# Patient Record
Sex: Female | Born: 1985 | Race: Black or African American | Hispanic: No | Marital: Single | Smoking: Never smoker
Health system: Southern US, Community
[De-identification: ages and names within clinical notes are randomized; demographics above are authoritative.]

## PROBLEM LIST (undated history)

## (undated) DIAGNOSIS — I272 Pulmonary hypertension, unspecified: Secondary | ICD-10-CM

## (undated) DIAGNOSIS — I10 Essential (primary) hypertension: Secondary | ICD-10-CM

## (undated) DIAGNOSIS — D219 Benign neoplasm of connective and other soft tissue, unspecified: Secondary | ICD-10-CM

## (undated) DIAGNOSIS — F329 Major depressive disorder, single episode, unspecified: Secondary | ICD-10-CM

## (undated) DIAGNOSIS — I82409 Acute embolism and thrombosis of unspecified deep veins of unspecified lower extremity: Secondary | ICD-10-CM

## (undated) DIAGNOSIS — G4730 Sleep apnea, unspecified: Secondary | ICD-10-CM

## (undated) DIAGNOSIS — E119 Type 2 diabetes mellitus without complications: Secondary | ICD-10-CM

## (undated) DIAGNOSIS — D6861 Antiphospholipid syndrome: Secondary | ICD-10-CM

## (undated) DIAGNOSIS — E669 Obesity, unspecified: Secondary | ICD-10-CM

## (undated) DIAGNOSIS — I2699 Other pulmonary embolism without acute cor pulmonale: Secondary | ICD-10-CM

## (undated) DIAGNOSIS — N939 Abnormal uterine and vaginal bleeding, unspecified: Secondary | ICD-10-CM

## (undated) DIAGNOSIS — R0981 Nasal congestion: Secondary | ICD-10-CM

## (undated) DIAGNOSIS — F419 Anxiety disorder, unspecified: Secondary | ICD-10-CM

## (undated) DIAGNOSIS — K219 Gastro-esophageal reflux disease without esophagitis: Secondary | ICD-10-CM

## (undated) DIAGNOSIS — F32A Depression, unspecified: Secondary | ICD-10-CM

## (undated) DIAGNOSIS — E0500 Thyrotoxicosis with diffuse goiter without thyrotoxic crisis or storm: Secondary | ICD-10-CM

## (undated) HISTORY — PX: CHOLECYSTECTOMY: SHX55

## (undated) HISTORY — PX: ABDOMINAL HYSTERECTOMY: SHX81

## (undated) HISTORY — PX: HERNIA REPAIR: SHX51

## (undated) HISTORY — DX: Thyrotoxicosis with diffuse goiter without thyrotoxic crisis or storm: E05.00

## (undated) HISTORY — PX: WISDOM TOOTH EXTRACTION: SHX21

## (undated) HISTORY — PX: COLONOSCOPY: SHX174

## (undated) HISTORY — DX: Antiphospholipid syndrome: D68.61

## (undated) HISTORY — DX: Sleep apnea, unspecified: G47.30

---

## 2006-12-15 IMAGING — CT CT HEAD W/O CM
2 series · 15 of 30 positions shown, 19 images · non-contrast
Comparison: NONE

CLINICAL DATA: Headache times 2 weeks. 

CT HEAD WITHOUT INTRAVENOUS CONTRAST
TECHNIQUE: Axial 5-millimeter thick slices were obtained through 
the posterior fossa and 5-millimeter thick slices were obtained 
through the remaining portion of the head without intravenous 
contrast.

[Series 2: without contrast · axial · non-contrast · 0.49mm/px · z∈[+129,+259]mm · 13 of 32 slices shown, 17 images]
[im 3/32  brain]
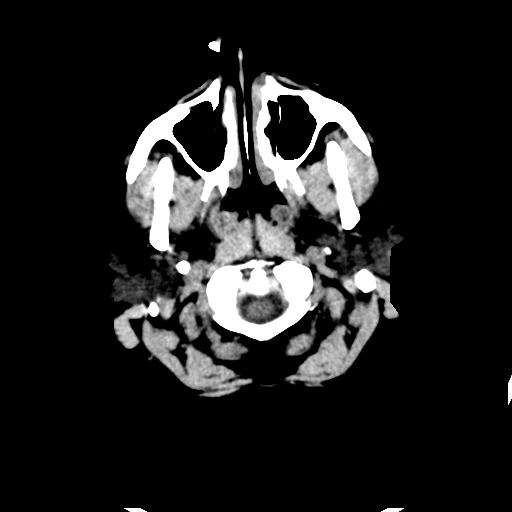
[im 3/32  bone]
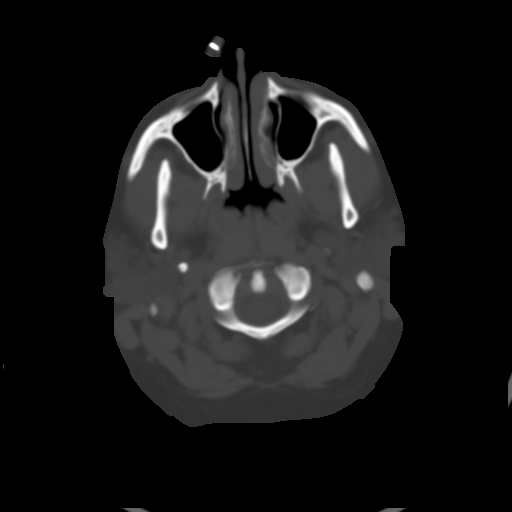
[im 5/32  brain]
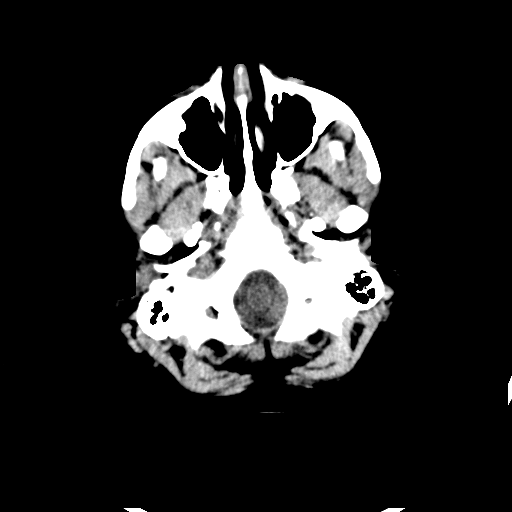
[im 7/32  brain]
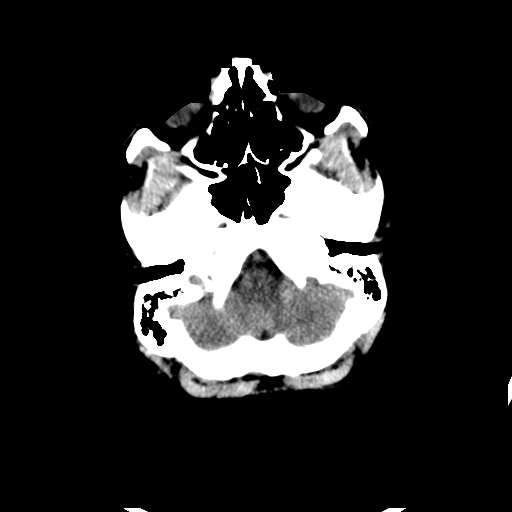
[im 9/32  brain]
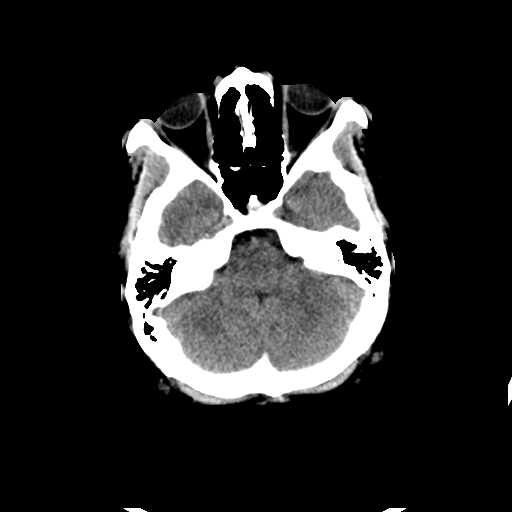
[im 12/32  brain]
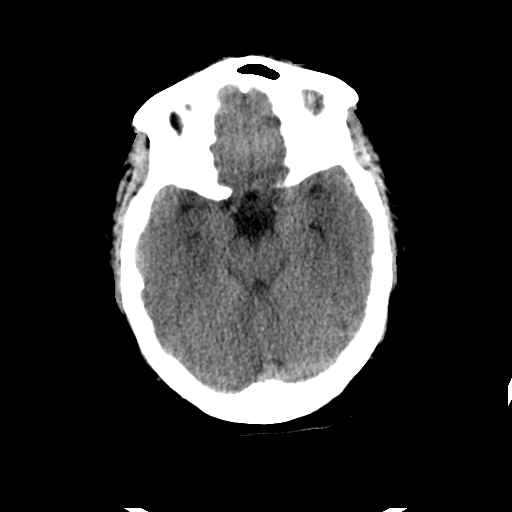
[im 12/32  bone]
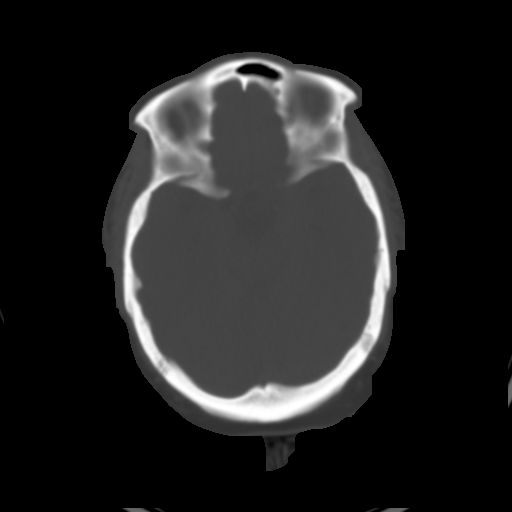
[im 14/32  brain]
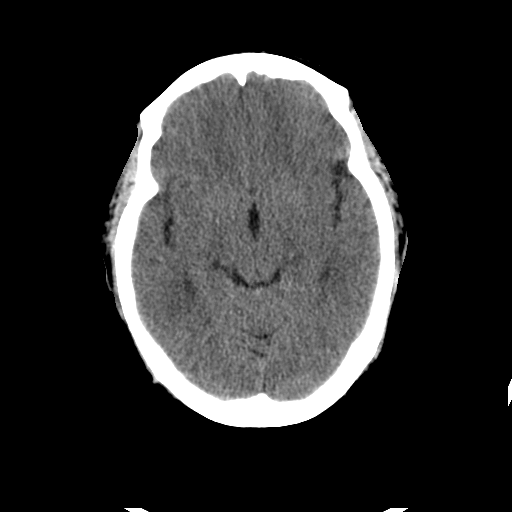
[im 16/32  brain]
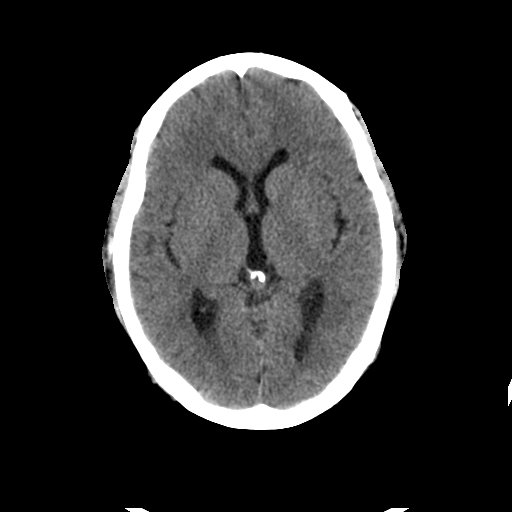
[im 18/32  brain]
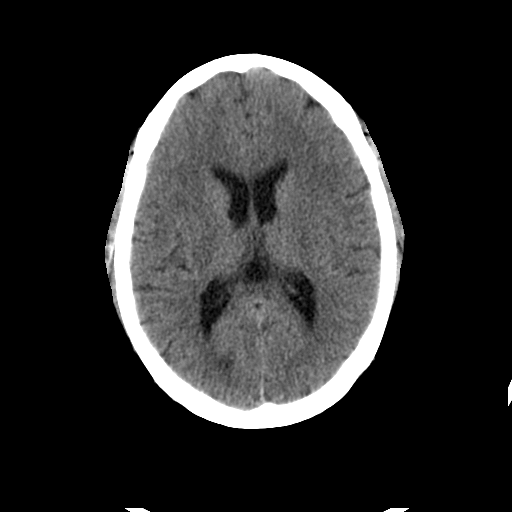
[im 20/32  brain]
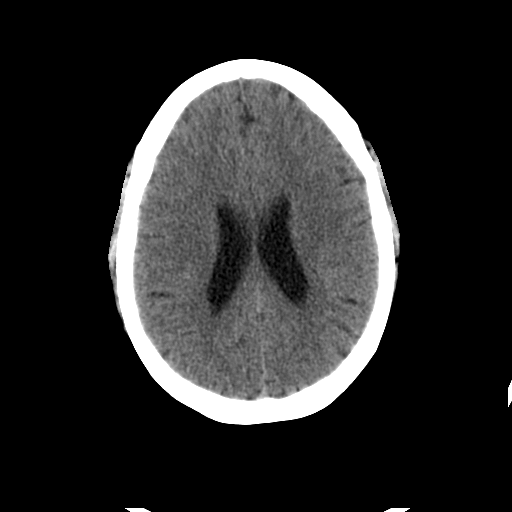
[im 20/32  bone]
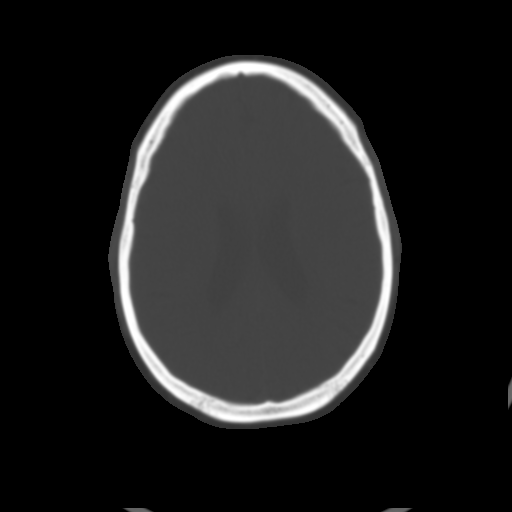
[im 23/32  brain]
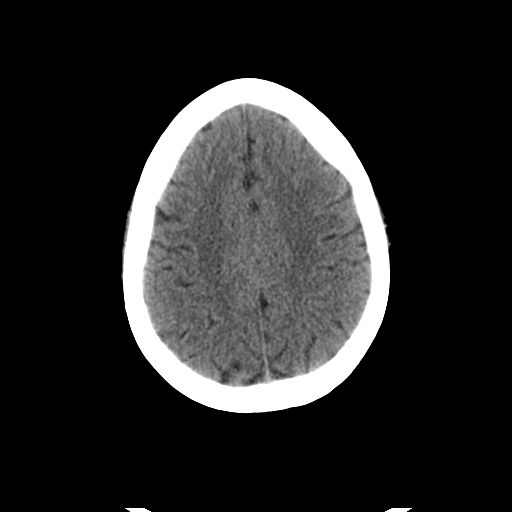
[im 25/32  brain]
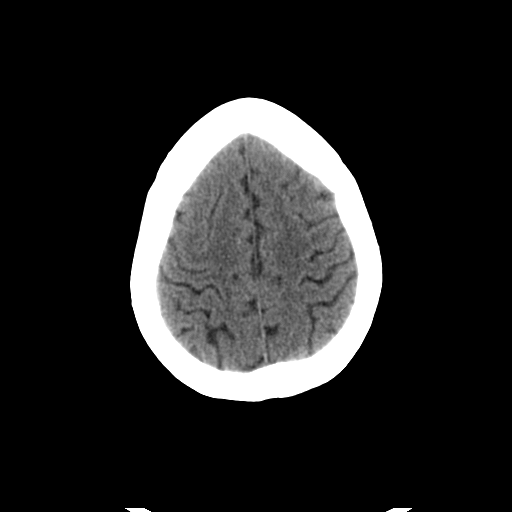
[im 27/32  brain]
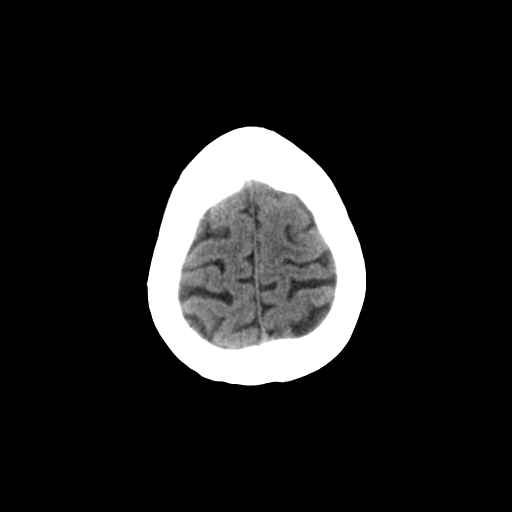
[im 29/32  brain]
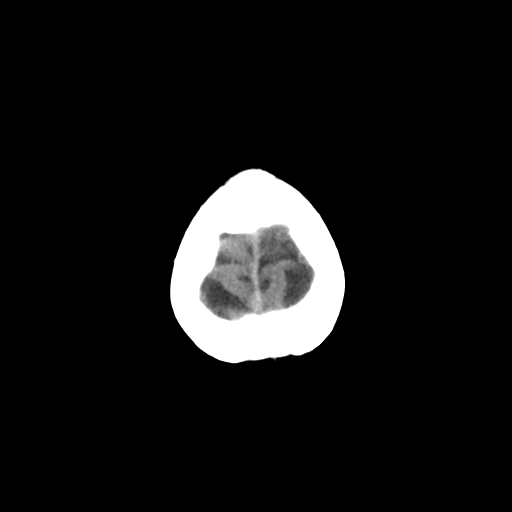
[im 29/32  bone]
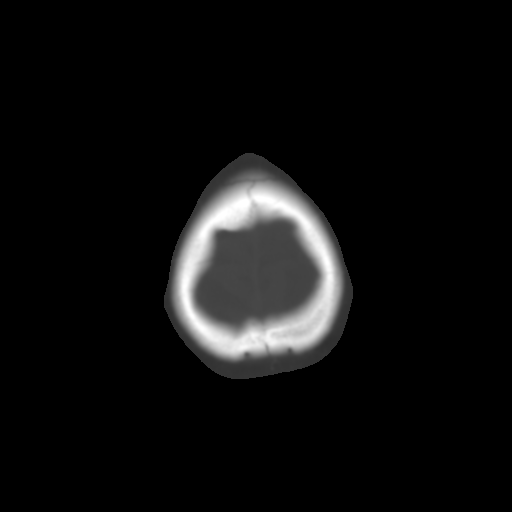

[Series 3: bone windows · axial · 0.49mm/px · z∈[+129,+149]mm · 2 of 32 slices shown]
[im 3/32  bone]
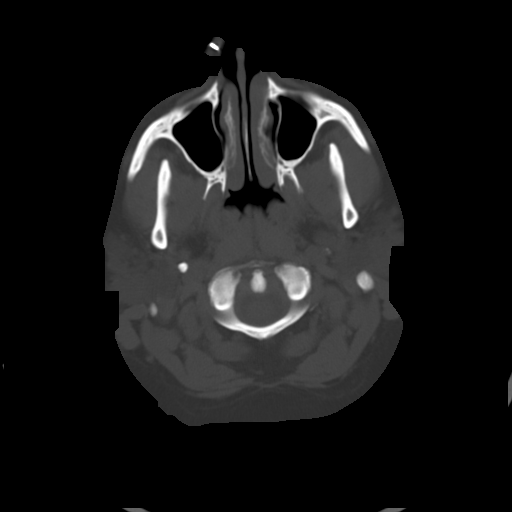
[im 7/32  bone]
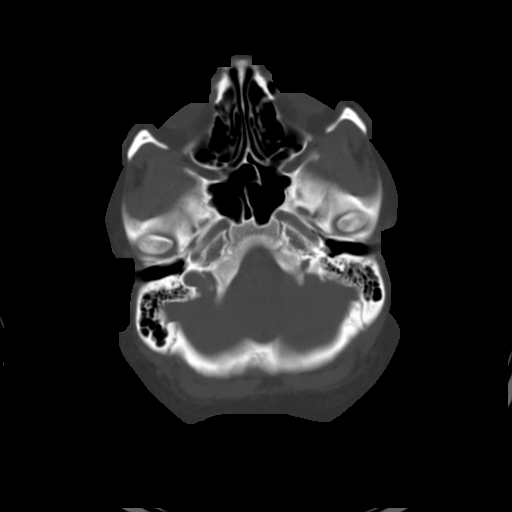

[15 of 30 positions shown; findings below may reference images not displayed]

FINDINGS: There are no prior CT scans for comparison. There are no 
air-fluid levels seen in the paranasal sinuses.  No fluid in the 
mastoid processes. The lateral ventricles are mildly enlarged.  
There is also mild prominence of the third ventricle.  This is 
more prominent than expected for patient???s age.  No evidence of 
hemorrhage or midline shift.  No focal intracranial lesions.  No 
extra-axial fluid collections are seen.
IMPRESSION: Enlarged lateral ventricles and third ventricle as 
described above.  This can be seen with central atrophy or 
hydrocephalus.  This is more prominent than expected for patient???s 
Date: [DATE]  Trans Date: [DATE] JH  JLM

## 2013-02-21 ENCOUNTER — Encounter (HOSPITAL_COMMUNITY): Payer: Self-pay | Admitting: *Deleted

## 2013-02-21 ENCOUNTER — Inpatient Hospital Stay (HOSPITAL_COMMUNITY)
Admission: AD | Admit: 2013-02-21 | Discharge: 2013-02-21 | Disposition: A | Discharge status: Home / Self Care | Payer: Federal, State, Local not specified - PPO | Source: Ambulatory Visit | Attending: Obstetrics and Gynecology | Admitting: Obstetrics and Gynecology

## 2013-02-21 DIAGNOSIS — D259 Leiomyoma of uterus, unspecified: Secondary | ICD-10-CM | POA: Insufficient documentation

## 2013-02-21 DIAGNOSIS — R109 Unspecified abdominal pain: Secondary | ICD-10-CM | POA: Insufficient documentation

## 2013-02-21 DIAGNOSIS — N920 Excessive and frequent menstruation with regular cycle: Secondary | ICD-10-CM | POA: Insufficient documentation

## 2013-02-21 HISTORY — DX: Benign neoplasm of connective and other soft tissue, unspecified: D21.9

## 2013-02-21 HISTORY — DX: Anxiety disorder, unspecified: F41.9

## 2013-02-21 HISTORY — DX: Obesity, unspecified: E66.9

## 2013-02-21 HISTORY — DX: Major depressive disorder, single episode, unspecified: F32.9

## 2013-02-21 HISTORY — DX: Abnormal uterine and vaginal bleeding, unspecified: N93.9

## 2013-02-21 HISTORY — DX: Nasal congestion: R09.81

## 2013-02-21 HISTORY — DX: Essential (primary) hypertension: I10

## 2013-02-21 HISTORY — DX: Depression, unspecified: F32.A

## 2013-02-21 LAB — CBC
MCH: 24.9 pg — ABNORMAL LOW (ref 26.0–34.0)
MCHC: 32.7 g/dL (ref 30.0–36.0)
MCV: 76.2 fL — ABNORMAL LOW (ref 78.0–100.0)
Platelets: 327 10*3/uL (ref 150–400)
RBC: 4.58 MIL/uL (ref 3.87–5.11)
RDW: 14.4 % (ref 11.5–15.5)
WBC: 9.7 10*3/uL (ref 4.0–10.5)

## 2013-02-21 LAB — URINALYSIS, ROUTINE W REFLEX MICROSCOPIC
Leukocytes, UA: NEGATIVE
Nitrite: NEGATIVE
Specific Gravity, Urine: 1.02 (ref 1.005–1.030)
Urobilinogen, UA: 0.2 mg/dL (ref 0.0–1.0)
pH: 6 (ref 5.0–8.0)

## 2013-02-21 LAB — URINE MICROSCOPIC-ADD ON

## 2013-02-21 LAB — POCT PREGNANCY, URINE: Preg Test, Ur: NEGATIVE

## 2013-02-21 NOTE — MAU Provider Note (Signed)
Chief Complaint: Vaginal Bleeding and Abdominal Pain   First Provider Initiated Contact with Patient 02/21/13 1757     SUBJECTIVE HPI: Heather Cherry is a 27 y.o. G0P0 who presents to maternity admissions reporting heavy vaginal bleeding.  She was recently diagnosed in the office with uterine fibroids.  Her menses have always been heavy and irregular but have recently gotten heavier.  Yesterday, she started her menses and picked up prescription for OCP taper as prescribed by Dr Donovan Kail.  She called her office related to the amount of bleeding she saw this morning and came in to MAU to be evaluated.  She reports soaking a super tampon/hour, and wearing a pad which is also getting soaked frequently.  Pt reports seeing some clots as well, around the size of a golf ball.  She reports daily fatigue but denies h/a, dizziness, heart palpitations, n/v, or fever/chills.     Past Medical History  Diagnosis Date  . Abnormal vaginal bleeding   . Fibroid   . Hypertension   . Anxiety   . Sinus congestion   . Depression   . Obesity    Past Surgical History  Procedure Laterality Date  . Wisdom tooth extraction     History   Social History  . Marital Status: Single    Spouse Name: N/A    Number of Children: N/A  . Years of Education: N/A   Occupational History  . Not on file.   Social History Main Topics  . Smoking status: Never Smoker   . Smokeless tobacco: Never Used  . Alcohol Use: Yes     Comment: occas. social  . Drug Use: No  . Sexual Activity: Yes     Comment: Last intercourse, 3 days ago. Not taking BCP since October   Other Topics Concern  . Not on file   Social History Narrative  . No narrative on file   No current facility-administered medications on file prior to encounter.   No current outpatient prescriptions on file prior to encounter.   No Known Allergies  ROS: Pertinent items in HPI  OBJECTIVE Blood pressure 136/77, pulse 103, temperature 98.4 F (36.9 C),  temperature source Oral, resp. rate 18, height 5' 8.5" (1.74 m), weight 166.924 kg (368 lb), last menstrual period 02/20/2013, SpO2 99.00%. GENERAL: Well-developed, well-nourished female in no acute distress.  HEENT: Normocephalic HEART: normal rate RESP: normal effort ABDOMEN: Soft, non-tender EXTREMITIES: Nontender, no edema NEURO: Alert and oriented Pelvic exam: Cervix pink, visually closed, without lesion, moderate amount dark red blood, with no pooling in vaginal vault and no clots noted, vaginal walls and external genitalia normal Bimanual exam: Cervix 0/long/high, firm, anterior, neg CMT, uterus mildly tender, slightly enlarged, adnexa without tenderness, enlargement, or mass  LAB RESULTS Results for orders placed during the hospital encounter of 02/21/13 (from the past 24 hour(s))  CBC     Status: Abnormal   Collection Time    02/21/13  7:27 PM      Result Value Range   WBC 9.7  4.0 - 10.5 K/uL   RBC 4.58  3.87 - 5.11 MIL/uL   Hemoglobin 11.4 (*) 12.0 - 15.0 g/dL   HCT 91.4 (*) 78.2 - 95.6 %   MCV 76.2 (*) 78.0 - 100.0 fL   MCH 24.9 (*) 26.0 - 34.0 pg   MCHC 32.7  30.0 - 36.0 g/dL   RDW 21.3  08.6 - 57.8 %   Platelets 327  150 - 400 K/uL    ASSESSMENT  1. Fibroid uterus   2. Menorrhagia     PLAN Consult Dr Henderson Cloud Discharge home Continue OCPs as prescribed F/U in office Return to MAU as needed    Medication List         clonazePAM 0.5 MG tablet  Commonly known as:  KLONOPIN  Take 0.5 mg by mouth 2 (two) times daily as needed for anxiety.     hydrochlorothiazide 25 MG tablet  Commonly known as:  HYDRODIURIL  Take 12.5 mg by mouth daily.     levonorgestrel-ethinyl estradiol 90-20 MCG tablet  Commonly known as:  LYBREL,AMETHYST  Take 1 tablet by mouth daily.     norgestimate-ethinyl estradiol 0.25-35 MG-MCG tablet  Commonly known as:  ORTHO-CYCLEN,SPRINTEC,PREVIFEM  Take 1 tablet by mouth daily.     phentermine 37.5 MG capsule  Take 37.5 mg by mouth  every morning.       Follow-up Information   Follow up with HORVATH,MICHELLE A, MD.   Specialty:  Obstetrics and Gynecology   Contact information:   [address] [place] [address] [phone]       Sharen Counter Certified Nurse-Midwife 02/22/2013  5:36 PM

## 2013-02-21 NOTE — MAU Note (Signed)
Patient states she has recently been diagnosed with fibroids. States she started her period yesterday and has been very heavy and a lot of pain.

## 2013-02-21 NOTE — MAU Note (Addendum)
Started out with primary MD. Was given BCP's. States today was prescribed medications by GYN, Dr. Donovan Kail; just started today. Was told if she did not improve, go to MAU. Has many other Rx medications that she is not taking.

## 2015-12-17 ENCOUNTER — Encounter (HOSPITAL_COMMUNITY): Payer: Self-pay | Admitting: *Deleted

## 2015-12-17 ENCOUNTER — Emergency Department (HOSPITAL_COMMUNITY)
Admission: EM | Admit: 2015-12-17 | Discharge: 2015-12-17 | Disposition: A | Discharge status: Home / Self Care | Payer: Self-pay | Attending: Emergency Medicine | Admitting: Emergency Medicine

## 2015-12-17 DIAGNOSIS — I10 Essential (primary) hypertension: Secondary | ICD-10-CM | POA: Insufficient documentation

## 2015-12-17 DIAGNOSIS — R1011 Right upper quadrant pain: Secondary | ICD-10-CM | POA: Insufficient documentation

## 2015-12-17 DIAGNOSIS — Z5321 Procedure and treatment not carried out due to patient leaving prior to being seen by health care provider: Secondary | ICD-10-CM | POA: Insufficient documentation

## 2015-12-17 LAB — COMPREHENSIVE METABOLIC PANEL
ALBUMIN: 3.1 g/dL — AB (ref 3.5–5.0)
ALT: 44 U/L (ref 14–54)
ANION GAP: 8 (ref 5–15)
AST: 40 U/L (ref 15–41)
Alkaline Phosphatase: 136 U/L — ABNORMAL HIGH (ref 38–126)
BUN: 8 mg/dL (ref 6–20)
CHLORIDE: 99 mmol/L — AB (ref 101–111)
CO2: 28 mmol/L (ref 22–32)
Calcium: 9.1 mg/dL (ref 8.9–10.3)
Creatinine, Ser: 0.78 mg/dL (ref 0.44–1.00)
GFR calc Af Amer: >60 mL/min (ref >60)
GFR calc non Af Amer: >60 mL/min (ref >60)
Glucose, Bld: 144 mg/dL — ABNORMAL HIGH (ref 65–99)
POTASSIUM: 3.3 mmol/L — AB (ref 3.5–5.1)
SODIUM: 135 mmol/L (ref 135–145)
Total Bilirubin: 0.7 mg/dL (ref 0.3–1.2)
Total Protein: 7.3 g/dL (ref 6.5–8.1)

## 2015-12-17 LAB — CBC
HEMATOCRIT: 31.9 % — AB (ref 36.0–46.0)
HEMOGLOBIN: 9.6 g/dL — AB (ref 12.0–15.0)
MCH: 21.1 pg — AB (ref 26.0–34.0)
MCHC: 30.1 g/dL (ref 30.0–36.0)
MCV: 70.3 fL — AB (ref 78.0–100.0)
Platelets: 392 10*3/uL (ref 150–400)
RBC: 4.54 MIL/uL (ref 3.87–5.11)
RDW: 16.8 % — ABNORMAL HIGH (ref 11.5–15.5)
WBC: 15 10*3/uL — ABNORMAL HIGH (ref 4.0–10.5)

## 2015-12-17 LAB — LIPASE, BLOOD: Lipase: 19 U/L (ref 11–51)

## 2015-12-17 MED ORDER — ONDANSETRON 4 MG PO TBDP
4.0000 mg | ORAL_TABLET | Freq: Once | ORAL | Status: AC | PRN
  Administered 2015-12-17: 4 mg via ORAL

## 2015-12-17 MED ORDER — OXYCODONE-ACETAMINOPHEN 5-325 MG PO TABS
ORAL_TABLET | ORAL | Status: AC
  Filled 2015-12-17: qty 1

## 2015-12-17 MED ORDER — ONDANSETRON 4 MG PO TBDP
ORAL_TABLET | ORAL | Status: AC
  Filled 2015-12-17: qty 1

## 2015-12-17 MED ORDER — OXYCODONE-ACETAMINOPHEN 5-325 MG PO TABS
1.0000 | ORAL_TABLET | Freq: Once | ORAL | Status: AC
  Administered 2015-12-17: 1 via ORAL

## 2015-12-17 NOTE — ED Notes (Signed)
Mother is taking pt to [facility].

## 2015-12-17 NOTE — ED Triage Notes (Signed)
Pt c/o right flank/RUQ pain since Sunday. Pt reports temp at home of 101.6. Pt has not taken any OTC medication. Pt denies dysuria, hematuria. Pt states movement, eating/drinking increases the pain.

## 2016-02-24 ENCOUNTER — Emergency Department
Admission: EM | Admit: 2016-02-24 | Discharge: 2016-02-24 | Disposition: A | Discharge status: Home / Self Care | Payer: BC Managed Care – PPO | Attending: Student in an Organized Health Care Education/Training Program | Admitting: Student in an Organized Health Care Education/Training Program

## 2016-02-24 ENCOUNTER — Encounter: Payer: Self-pay | Admitting: Emergency Medicine

## 2016-02-24 ENCOUNTER — Emergency Department: Payer: BC Managed Care – PPO

## 2016-02-24 DIAGNOSIS — I10 Essential (primary) hypertension: Secondary | ICD-10-CM | POA: Insufficient documentation

## 2016-02-24 DIAGNOSIS — I82531 Chronic embolism and thrombosis of right popliteal vein: Secondary | ICD-10-CM | POA: Insufficient documentation

## 2016-02-24 DIAGNOSIS — G8929 Other chronic pain: Secondary | ICD-10-CM

## 2016-02-24 DIAGNOSIS — G932 Benign intracranial hypertension: Secondary | ICD-10-CM | POA: Insufficient documentation

## 2016-02-24 DIAGNOSIS — R51 Headache: Secondary | ICD-10-CM | POA: Diagnosis present

## 2016-02-24 DIAGNOSIS — Z79899 Other long term (current) drug therapy: Secondary | ICD-10-CM | POA: Insufficient documentation

## 2016-02-24 LAB — CBC WITH DIFFERENTIAL/PLATELET
BASOS ABS: 0 10*3/uL (ref 0–0.1)
Basophils Relative: 0 %
EOS ABS: 0.1 10*3/uL (ref 0–0.7)
EOS PCT: 1 %
HCT: 31.2 % — ABNORMAL LOW (ref 35.0–47.0)
Hemoglobin: 9.9 g/dL — ABNORMAL LOW (ref 12.0–16.0)
LYMPHS PCT: 13 %
Lymphs Abs: 1.4 10*3/uL (ref 1.0–3.6)
MCH: 20.7 pg — ABNORMAL LOW (ref 26.0–34.0)
MCHC: 31.8 g/dL — ABNORMAL LOW (ref 32.0–36.0)
MCV: 65.1 fL — AB (ref 80.0–100.0)
Monocytes Absolute: 0.7 10*3/uL (ref 0.2–0.9)
Monocytes Relative: 6 %
Neutro Abs: 9.2 10*3/uL — ABNORMAL HIGH (ref 1.4–6.5)
Neutrophils Relative %: 80 %
Platelets: 206 10*3/uL (ref 150–440)
RBC: 4.79 MIL/uL (ref 3.80–5.20)
RDW: 19 % — ABNORMAL HIGH (ref 11.5–14.5)
WBC: 11.5 10*3/uL — ABNORMAL HIGH (ref 3.6–11.0)

## 2016-02-24 LAB — COMPREHENSIVE METABOLIC PANEL
ALT: 19 U/L (ref 14–54)
AST: 23 U/L (ref 15–41)
Albumin: 3.5 g/dL (ref 3.5–5.0)
Alkaline Phosphatase: 90 U/L (ref 38–126)
Anion gap: 10 (ref 5–15)
BUN: 9 mg/dL (ref 6–20)
CHLORIDE: 102 mmol/L (ref 101–111)
CO2: 22 mmol/L (ref 22–32)
CREATININE: 0.83 mg/dL (ref 0.44–1.00)
Calcium: 9.2 mg/dL (ref 8.9–10.3)
GFR calc Af Amer: >60 mL/min (ref >60)
GFR calc non Af Amer: >60 mL/min (ref >60)
GLUCOSE: 128 mg/dL — AB (ref 65–99)
Potassium: 2.9 mmol/L — ABNORMAL LOW (ref 3.5–5.1)
SODIUM: 134 mmol/L — AB (ref 135–145)
Total Bilirubin: 0.5 mg/dL (ref 0.3–1.2)
Total Protein: 8.1 g/dL (ref 6.5–8.1)

## 2016-02-24 LAB — HCG, QUANTITATIVE, PREGNANCY: hCG, Beta Chain, Quant, S: <1 m[IU]/mL (ref <5)

## 2016-02-24 MED ORDER — BUTALBITAL-APAP-CAFFEINE 50-325-40 MG PO TABS: 1.0000 | ORAL_TABLET | Freq: Four times a day (QID) | ORAL | 0 refills | Status: AC | PRN

## 2016-02-24 MED ORDER — PROCHLORPERAZINE EDISYLATE 5 MG/ML IJ SOLN
10.0000 mg | Freq: Once | INTRAMUSCULAR | Status: AC
  Administered 2016-02-24: 10 mg via INTRAVENOUS
  Filled 2016-02-24: qty 2

## 2016-02-24 MED ORDER — DIPHENHYDRAMINE HCL 50 MG/ML IJ SOLN
25.0000 mg | Freq: Once | INTRAMUSCULAR | Status: AC
  Administered 2016-02-24: 25 mg via INTRAVENOUS
  Filled 2016-02-24: qty 1

## 2016-02-24 MED ORDER — ACETAMINOPHEN 500 MG PO TABS
1000.0000 mg | ORAL_TABLET | Freq: Once | ORAL | Status: DC
  Filled 2016-02-24: qty 2

## 2016-02-24 MED ORDER — HALOPERIDOL LACTATE 5 MG/ML IJ SOLN
5.0000 mg | Freq: Once | INTRAMUSCULAR | Status: AC
  Administered 2016-02-24: 5 mg via INTRAMUSCULAR
  Filled 2016-02-24: qty 1

## 2016-02-24 MED ORDER — RIVAROXABAN (XARELTO) VTE STARTER PACK (15 & 20 MG): ORAL_TABLET | ORAL | 0 refills | Status: DC

## 2016-02-24 MED ORDER — SODIUM CHLORIDE 0.9 % IV BOLUS (SEPSIS)
1000.0000 mL | Freq: Once | INTRAVENOUS | Status: AC
  Administered 2016-02-24: 1000 mL via INTRAVENOUS

## 2016-02-24 NOTE — ED Provider Notes (Signed)
[facility] Emergency Department Provider Note    First MD Initiated Contact with Patient 02/24/16 1428     (approximate)  I have reviewed the triage vital signs and the nursing notes.   HISTORY  Chief Complaint Headache    HPI Heather Cherry is a 30 y.o. 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 She Has Had Multiple Evaluations and ER at [facility] As Well As Her Family Practitioner. Just Recently Had an LP Performed within the past Week. She's Been Seen by an Ophthalmologist As Well after Being Told That She Had Papillary Edema Which Led to a Recent LP. Patient Was Started on Steroids, Diamox and her symptomatically management.  Patient presents with recurrent headache which is not worse than previous. Denies any visual changes. Does have some right lower extremity pain in the right posterior calf. States she's also felt nauseated secondary to the headache. Denies any abdominal pain.  Had recent MRA MRV which was reportedly negative. She denies any low back pain, numbness or tingling.   Past Medical History:  Diagnosis Date  . Abnormal vaginal bleeding   . Anxiety   . Depression   . Fibroid   . Hypertension   . Obesity   . Sinus congestion    No family history on file. Past Surgical History:  Procedure Laterality Date  . CHOLECYSTECTOMY    . WISDOM TOOTH EXTRACTION     There are no active problems to display for this patient.     Prior to Admission medications   Medication Sig Start Date End Date Taking? Authorizing Provider  clonazePAM (KLONOPIN) 0.5 MG tablet Take 0.5 mg by mouth 2 (two) times daily as needed for anxiety.    Historical Provider, MD  hydrochlorothiazide (HYDRODIURIL) 25 MG tablet Take 12.5 mg by mouth daily.    Historical Provider, MD  levonorgestrel-ethinyl estradiol (LYBREL,AMETHYST) 90-20 MCG tablet Take 1 tablet by mouth daily.    Historical Provider, MD  norgestimate-ethinyl  estradiol (ORTHO-CYCLEN,SPRINTEC,PREVIFEM) 0.25-35 MG-MCG tablet Take 1 tablet by mouth daily.    Historical Provider, MD  phentermine 37.5 MG capsule Take 37.5 mg by mouth every morning.    Historical Provider, MD    Allergies Patient has no known allergies.    Social History Social History  Substance Use Topics  . Smoking status: Never Smoker  . Smokeless tobacco: Never Used  . Alcohol use Yes     Comment: occas. social    Review of Systems Patient denies headaches, rhinorrhea, blurry vision, numbness, shortness of breath, chest pain, edema, cough, abdominal pain, nausea, vomiting, diarrhea, dysuria, fevers, rashes or hallucinations unless otherwise stated above in HPI. ____________________________________________   PHYSICAL EXAM:  VITAL SIGNS: Vitals:   02/24/16 1315 02/24/16 1724  BP: (!) 127/99 135/76  Pulse: (!) 113 100  Resp: 18 16  Temp: 98.1 F (36.7 C)     Constitutional: Alert and oriented. Well appearing and in no acute distress. Eyes: Conjunctivae are normal. PERRL. EOMI.  No papillary edema noted Head: Atraumatic. Nose: No congestion/rhinnorhea. Mouth/Throat: Mucous membranes are moist.  Oropharynx non-erythematous. Neck: No stridor. Painless ROM. No cervical spine tenderness to palpation Hematological/Lymphatic/Immunilogical: No cervical lymphadenopathy. Cardiovascular: Normal rate, regular rhythm. Grossly normal heart sounds.  Good peripheral circulation. Respiratory: Normal respiratory effort.  No retractions. Lungs CTAB. Gastrointestinal: Soft and nontender. No distention. No abdominal bruits. No CVA tenderness. Genitourinary:  Musculoskeletal: No lower extremity tenderness nor edema.  No joint effusions. Neurologic:  CN- intact.  No facial  droop, Normal FNF.  Normal heel to shin.  Sensation intact bilaterally. Normal speech and language. No gross focal neurologic deficits are appreciated. No gait instability. . Skin:  Skin is warm, dry and intact.  No rash noted. Psychiatric: Mood and affect are normal. Speech and behavior are normal. _________________________________________   LABS (all labs ordered are listed, but only abnormal results are displayed)  Results for orders placed or performed during the hospital encounter of 02/24/16 (from the past 24 hour(s))  CBC with Differential/Platelet     Status: Abnormal   Collection Time: 02/24/16  2:34 PM  Result Value Ref Range   WBC 11.5 (H) 3.6 - 11.0 K/uL   RBC 4.79 3.80 - 5.20 MIL/uL   Hemoglobin 9.9 (L) 12.0 - 16.0 g/dL   HCT 31.2 (L) 35.0 - 47.0 %   MCV 65.1 (L) 80.0 - 100.0 fL   MCH 20.7 (L) 26.0 - 34.0 pg   MCHC 31.8 (L) 32.0 - 36.0 g/dL   RDW 19.0 (H) 11.5 - 14.5 %   Platelets 206 150 - 440 K/uL   Neutrophils Relative % 80 %   Neutro Abs 9.2 (H) 1.4 - 6.5 K/uL   Lymphocytes Relative 13 %   Lymphs Abs 1.4 1.0 - 3.6 K/uL   Monocytes Relative 6 %   Monocytes Absolute 0.7 0.2 - 0.9 K/uL   Eosinophils Relative 1 %   Eosinophils Absolute 0.1 0 - 0.7 K/uL   Basophils Relative 0 %   Basophils Absolute 0.0 0 - 0.1 K/uL  Comprehensive metabolic panel     Status: Abnormal   Collection Time: 02/24/16  2:34 PM  Result Value Ref Range   Sodium 134 (L) 135 - 145 mmol/L   Potassium 2.9 (L) 3.5 - 5.1 mmol/L   Chloride 102 101 - 111 mmol/L   CO2 22 22 - 32 mmol/L   Glucose, Bld 128 (H) 65 - 99 mg/dL   BUN 9 6 - 20 mg/dL   Creatinine, Ser 0.83 0.44 - 1.00 mg/dL   Calcium 9.2 8.9 - 10.3 mg/dL   Total Protein 8.1 6.5 - 8.1 g/dL   Albumin 3.5 3.5 - 5.0 g/dL   AST 23 15 - 41 U/L   ALT 19 14 - 54 U/L   Alkaline Phosphatase 90 38 - 126 U/L   Total Bilirubin 0.5 0.3 - 1.2 mg/dL   GFR calc non Af Amer >60 >60 mL/min   GFR calc Af Amer >60 >60 mL/min   Anion gap 10 5 - 15  hCG, quantitative, pregnancy     Status: None   Collection Time: 02/24/16  2:34 PM  Result Value Ref Range   hCG, Beta Chain, Quant, S <1 <5 mIU/mL    ____________________________________________  EKG____________________________________________  RADIOLOGY   ____________________________________________   PROCEDURES  Procedure(s) performed: none Procedures    Critical Care performed: no ____________________________________________   INITIAL IMPRESSION / ASSESSMENT AND PLAN / ED COURSE  Pertinent labs & imaging results that were available during my care of the patient were reviewed by me and considered in my medical decision making (see chart for details).  DDX: . Pseudotumor, cerebri, migraine, anxiety  Heather Cherry is a 30 y.o. who presents to the ED with with Hx of pseudotumor cerebri p/w HA for last several days. Not worst HA ever. Gradual onset. HA similar to previous episodes. Denies focal neurologic symptoms. Denies trauma. No fevers or neck pain. No vision loss. Afebrile in ED. VSS. Exam as above. Presentation most consistent with  persistent pseudotumor cerebri. Patient was complaining of right calf pain therefore will order ultrasound evaluate for DVT. She had recent MRI and MRV showed no evidence of venous sinus thrombosis. Timing less consistent with post-LP headache. Does not have any visual deficits or papillary edema to suggest need for emergent lumbar puncture.  She has no new deficits and headache is similar to previous do not feel emergent neuroimaging clinically indicated.  The patient will be placed on continuous pulse oximetry and telemetry for monitoring.  Laboratory evaluation will be sent to evaluate for the above complaints.       Clinical Course as of Feb 24 2112  Mon Feb 24, 2016  1708 Patient states headache has improved. Ultrasound of right lower extremity does show evidence of probable chronic DVT but given size and extension in the popliteal fossa we will treat.  Discuss risks benefits of anticoagulation but given the size of a DVT and now that she is 5 days post lumbar puncture feel that the  benefits of systemic anticoagulations outweigh the risks. She has no evidence of PE. Do not feel radiographic imaging clinically indicated as there is no change in character of her headache.   I recommended lumbar puncture to help relieve fluid and potentially get some symptomatic improvement the patient has refused this. Patient has follow-up with neurosurgery this week. Patient has good understanding of signs and symptoms for which she should return to the ER.  Patient was able to tolerate PO and was able to ambulate with a steady gait.  Have discussed with the patient and available family all diagnostics and treatments performed thus far and all questions were answered to the best of my ability. The patient demonstrates understanding and agreement with plan.   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    Merlyn Lot, MD 02/24/16 2114

## 2016-02-24 NOTE — ED Notes (Signed)
Pt in [place] during hourly rounding.

## 2016-02-24 NOTE — ED Notes (Signed)
Pt refused PO tylenol stating "its a joke. It won't work for my pain." MD made aware.

## 2016-02-24 NOTE — ED Notes (Signed)
Pt moved to Room 13  Report to [place]

## 2016-02-24 NOTE — ED Notes (Signed)
States she was seen last weds for headache and sent to [facility] for a scan  .Oncology Thursday was told to return and had a LP done to relief some pressure  Headache became worse   Went back on sat and had another LP  now having increased headache with n/v  And numbness into right leg

## 2016-02-24 NOTE — ED Triage Notes (Signed)
Pt presents to ED with reports of headache for two months. Pt states today she is nauseated and her right leg hurts. Pt alert and oriented in triage. Pt reports she has taken diamox and hydrocodone for the headache and nothing is helping.

## 2016-03-12 ENCOUNTER — Encounter: Payer: Self-pay | Admitting: Emergency Medicine

## 2016-03-12 ENCOUNTER — Emergency Department: Payer: BC Managed Care – PPO

## 2016-03-12 ENCOUNTER — Inpatient Hospital Stay
Admission: EM | Admit: 2016-03-12 | Discharge: 2016-03-17 | DRG: 175 | Disposition: A | Discharge status: Home / Self Care | Payer: BC Managed Care – PPO | Attending: Internal Medicine | Admitting: Internal Medicine

## 2016-03-12 DIAGNOSIS — Z9049 Acquired absence of other specified parts of digestive tract: Secondary | ICD-10-CM | POA: Diagnosis not present

## 2016-03-12 DIAGNOSIS — F419 Anxiety disorder, unspecified: Secondary | ICD-10-CM | POA: Diagnosis present

## 2016-03-12 DIAGNOSIS — I2699 Other pulmonary embolism without acute cor pulmonale: Secondary | ICD-10-CM | POA: Diagnosis present

## 2016-03-12 DIAGNOSIS — G932 Benign intracranial hypertension: Secondary | ICD-10-CM | POA: Diagnosis present

## 2016-03-12 DIAGNOSIS — K219 Gastro-esophageal reflux disease without esophagitis: Secondary | ICD-10-CM | POA: Diagnosis present

## 2016-03-12 DIAGNOSIS — I82401 Acute embolism and thrombosis of unspecified deep veins of right lower extremity: Secondary | ICD-10-CM | POA: Diagnosis not present

## 2016-03-12 DIAGNOSIS — G629 Polyneuropathy, unspecified: Secondary | ICD-10-CM | POA: Diagnosis present

## 2016-03-12 DIAGNOSIS — I272 Pulmonary hypertension, unspecified: Secondary | ICD-10-CM | POA: Diagnosis not present

## 2016-03-12 DIAGNOSIS — E119 Type 2 diabetes mellitus without complications: Secondary | ICD-10-CM | POA: Diagnosis not present

## 2016-03-12 DIAGNOSIS — D509 Iron deficiency anemia, unspecified: Secondary | ICD-10-CM | POA: Diagnosis present

## 2016-03-12 DIAGNOSIS — Z6841 Body Mass Index (BMI) 40.0 and over, adult: Secondary | ICD-10-CM

## 2016-03-12 DIAGNOSIS — Z79899 Other long term (current) drug therapy: Secondary | ICD-10-CM | POA: Diagnosis not present

## 2016-03-12 DIAGNOSIS — R739 Hyperglycemia, unspecified: Secondary | ICD-10-CM | POA: Diagnosis present

## 2016-03-12 DIAGNOSIS — F329 Major depressive disorder, single episode, unspecified: Secondary | ICD-10-CM | POA: Insufficient documentation

## 2016-03-12 DIAGNOSIS — R000 Tachycardia, unspecified: Secondary | ICD-10-CM | POA: Diagnosis present

## 2016-03-12 DIAGNOSIS — J209 Acute bronchitis, unspecified: Secondary | ICD-10-CM | POA: Diagnosis not present

## 2016-03-12 DIAGNOSIS — Z86718 Personal history of other venous thrombosis and embolism: Secondary | ICD-10-CM

## 2016-03-12 DIAGNOSIS — Z86711 Personal history of pulmonary embolism: Secondary | ICD-10-CM | POA: Diagnosis present

## 2016-03-12 DIAGNOSIS — I82409 Acute embolism and thrombosis of unspecified deep veins of unspecified lower extremity: Secondary | ICD-10-CM | POA: Diagnosis present

## 2016-03-12 DIAGNOSIS — Z7984 Long term (current) use of oral hypoglycemic drugs: Secondary | ICD-10-CM | POA: Diagnosis not present

## 2016-03-12 DIAGNOSIS — N920 Excessive and frequent menstruation with regular cycle: Secondary | ICD-10-CM | POA: Diagnosis not present

## 2016-03-12 DIAGNOSIS — J90 Pleural effusion, not elsewhere classified: Secondary | ICD-10-CM | POA: Diagnosis not present

## 2016-03-12 DIAGNOSIS — R079 Chest pain, unspecified: Secondary | ICD-10-CM | POA: Diagnosis not present

## 2016-03-12 DIAGNOSIS — Z888 Allergy status to other drugs, medicaments and biological substances status: Secondary | ICD-10-CM

## 2016-03-12 DIAGNOSIS — Z7901 Long term (current) use of anticoagulants: Secondary | ICD-10-CM | POA: Diagnosis not present

## 2016-03-12 DIAGNOSIS — I10 Essential (primary) hypertension: Secondary | ICD-10-CM | POA: Diagnosis present

## 2016-03-12 DIAGNOSIS — I2609 Other pulmonary embolism with acute cor pulmonale: Secondary | ICD-10-CM | POA: Diagnosis present

## 2016-03-12 DIAGNOSIS — E876 Hypokalemia: Secondary | ICD-10-CM | POA: Diagnosis present

## 2016-03-12 DIAGNOSIS — R0781 Pleurodynia: Secondary | ICD-10-CM | POA: Diagnosis present

## 2016-03-12 DIAGNOSIS — Z809 Family history of malignant neoplasm, unspecified: Secondary | ICD-10-CM

## 2016-03-12 DIAGNOSIS — F32A Depression, unspecified: Secondary | ICD-10-CM | POA: Insufficient documentation

## 2016-03-12 HISTORY — DX: Acute embolism and thrombosis of unspecified deep veins of unspecified lower extremity: I82.409

## 2016-03-12 LAB — PROTIME-INR
INR: 1.85
Prothrombin Time: 21.6 seconds — ABNORMAL HIGH (ref 11.4–15.2)

## 2016-03-12 LAB — HEPATIC FUNCTION PANEL
ALBUMIN: 3.4 g/dL — AB (ref 3.5–5.0)
ALT: 12 U/L — AB (ref 14–54)
AST: 17 U/L (ref 15–41)
Alkaline Phosphatase: 66 U/L (ref 38–126)
Bilirubin, Direct: <0.1 mg/dL — ABNORMAL LOW (ref 0.1–0.5)
TOTAL PROTEIN: 7.5 g/dL (ref 6.5–8.1)
Total Bilirubin: 0.5 mg/dL (ref 0.3–1.2)

## 2016-03-12 LAB — BASIC METABOLIC PANEL
ANION GAP: 9 (ref 5–15)
BUN: 10 mg/dL (ref 6–20)
CALCIUM: 9.1 mg/dL (ref 8.9–10.3)
CHLORIDE: 102 mmol/L (ref 101–111)
CO2: 26 mmol/L (ref 22–32)
Creatinine, Ser: 0.77 mg/dL (ref 0.44–1.00)
GFR calc non Af Amer: >60 mL/min (ref >60)
GLUCOSE: 123 mg/dL — AB (ref 65–99)
Potassium: 2.3 mmol/L — CL (ref 3.5–5.1)
Sodium: 137 mmol/L (ref 135–145)

## 2016-03-12 LAB — MAGNESIUM: MAGNESIUM: 1.8 mg/dL (ref 1.7–2.4)

## 2016-03-12 LAB — CBC
HEMATOCRIT: 29.8 % — AB (ref 35.0–47.0)
HEMOGLOBIN: 9.6 g/dL — AB (ref 12.0–16.0)
MCH: 21.2 pg — AB (ref 26.0–34.0)
MCHC: 32.1 g/dL (ref 32.0–36.0)
MCV: 66.1 fL — AB (ref 80.0–100.0)
Platelets: 347 10*3/uL (ref 150–440)
RBC: 4.51 MIL/uL (ref 3.80–5.20)
RDW: 20.1 % — ABNORMAL HIGH (ref 11.5–14.5)
WBC: 9.3 10*3/uL (ref 3.6–11.0)

## 2016-03-12 LAB — APTT: APTT: 60 s — AB (ref 24–36)

## 2016-03-12 LAB — HEPARIN LEVEL (UNFRACTIONATED): Heparin Unfractionated: >3.6 IU/mL — ABNORMAL HIGH (ref 0.30–0.70)

## 2016-03-12 LAB — TROPONIN I

## 2016-03-12 IMAGING — CR DG CHEST 2V
2 series · 2 of 2 positions shown · non-contrast
Comparison: None.

CLINICAL DATA: Chest pain under the right breast

EXAM:
CHEST  2 VIEW

[chest pa]
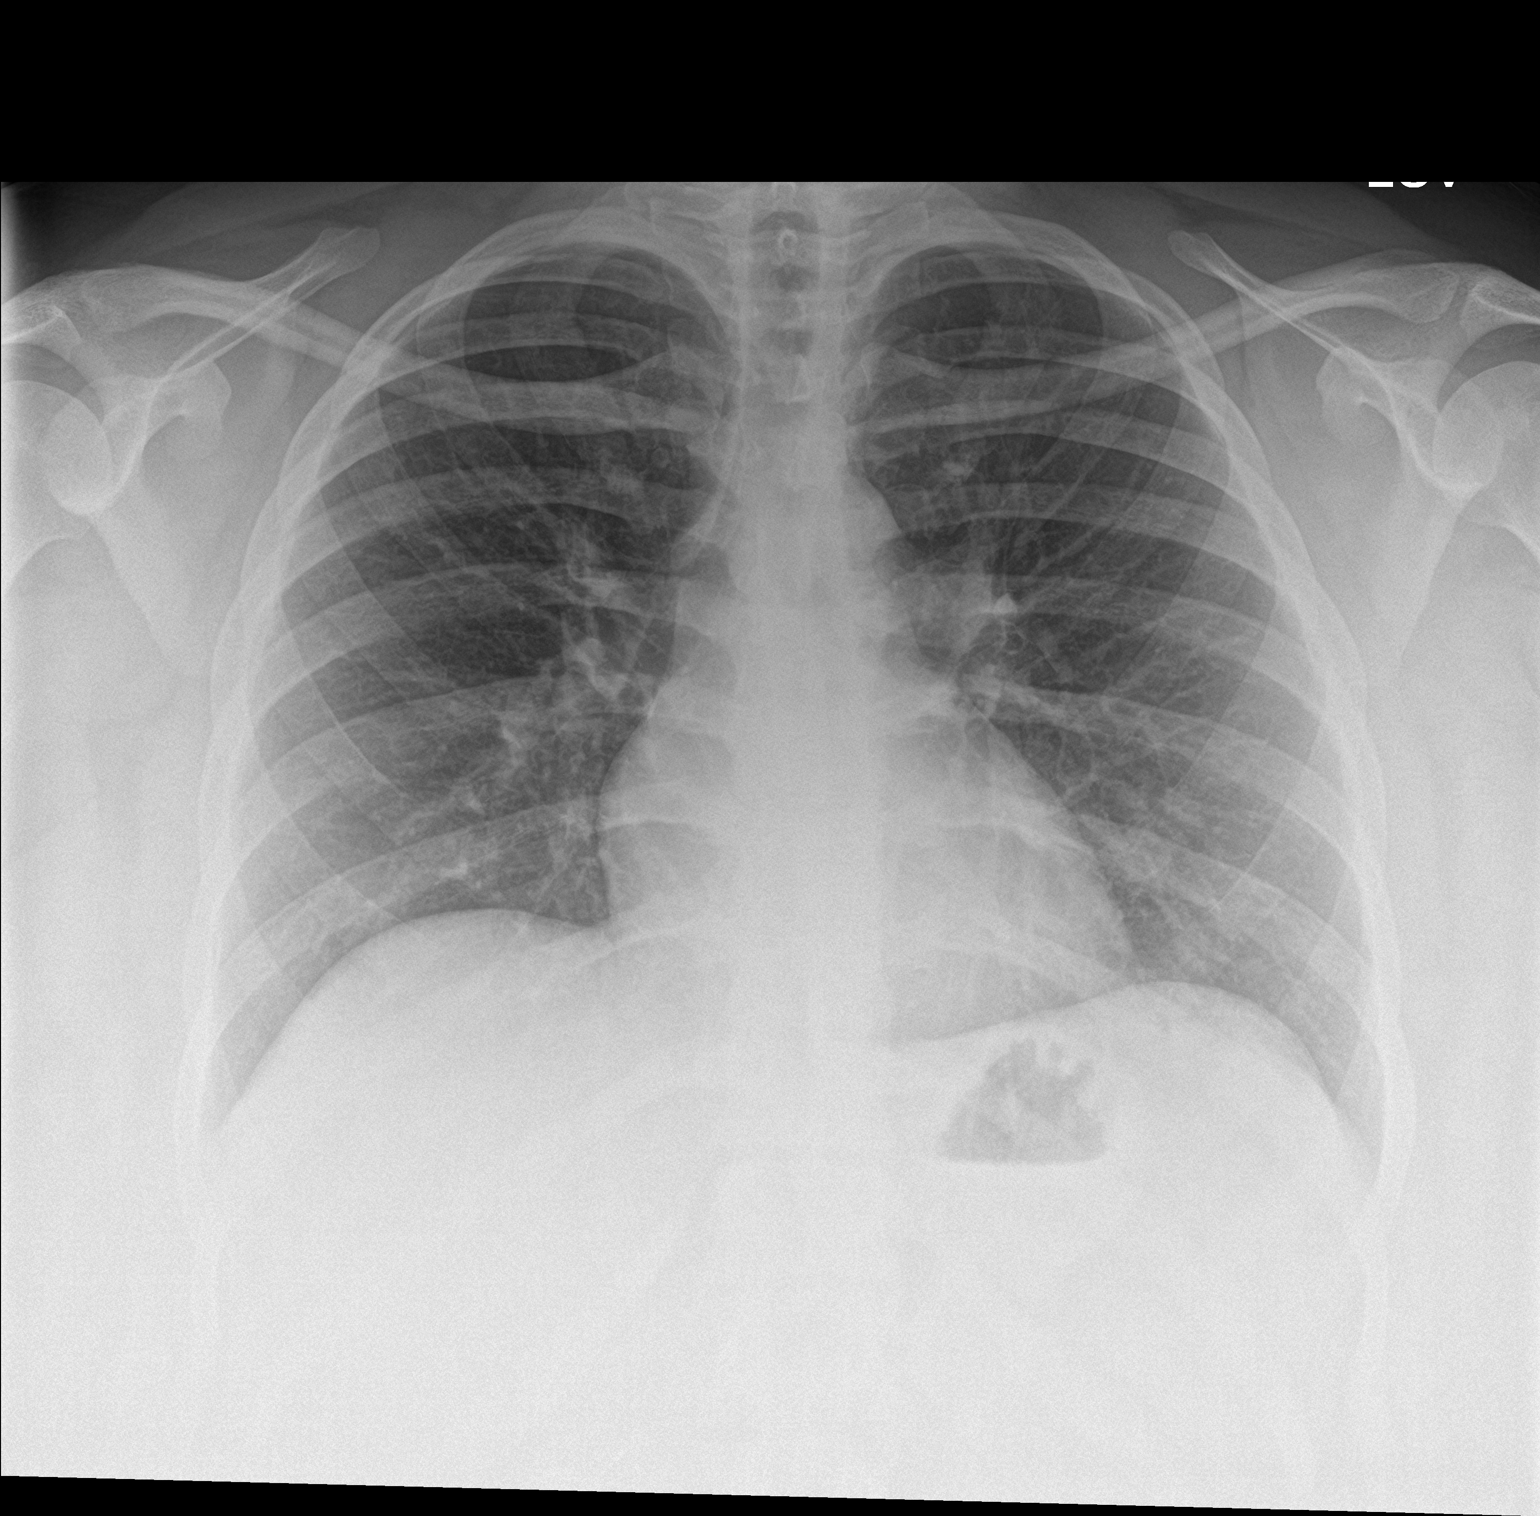

[chest lat]
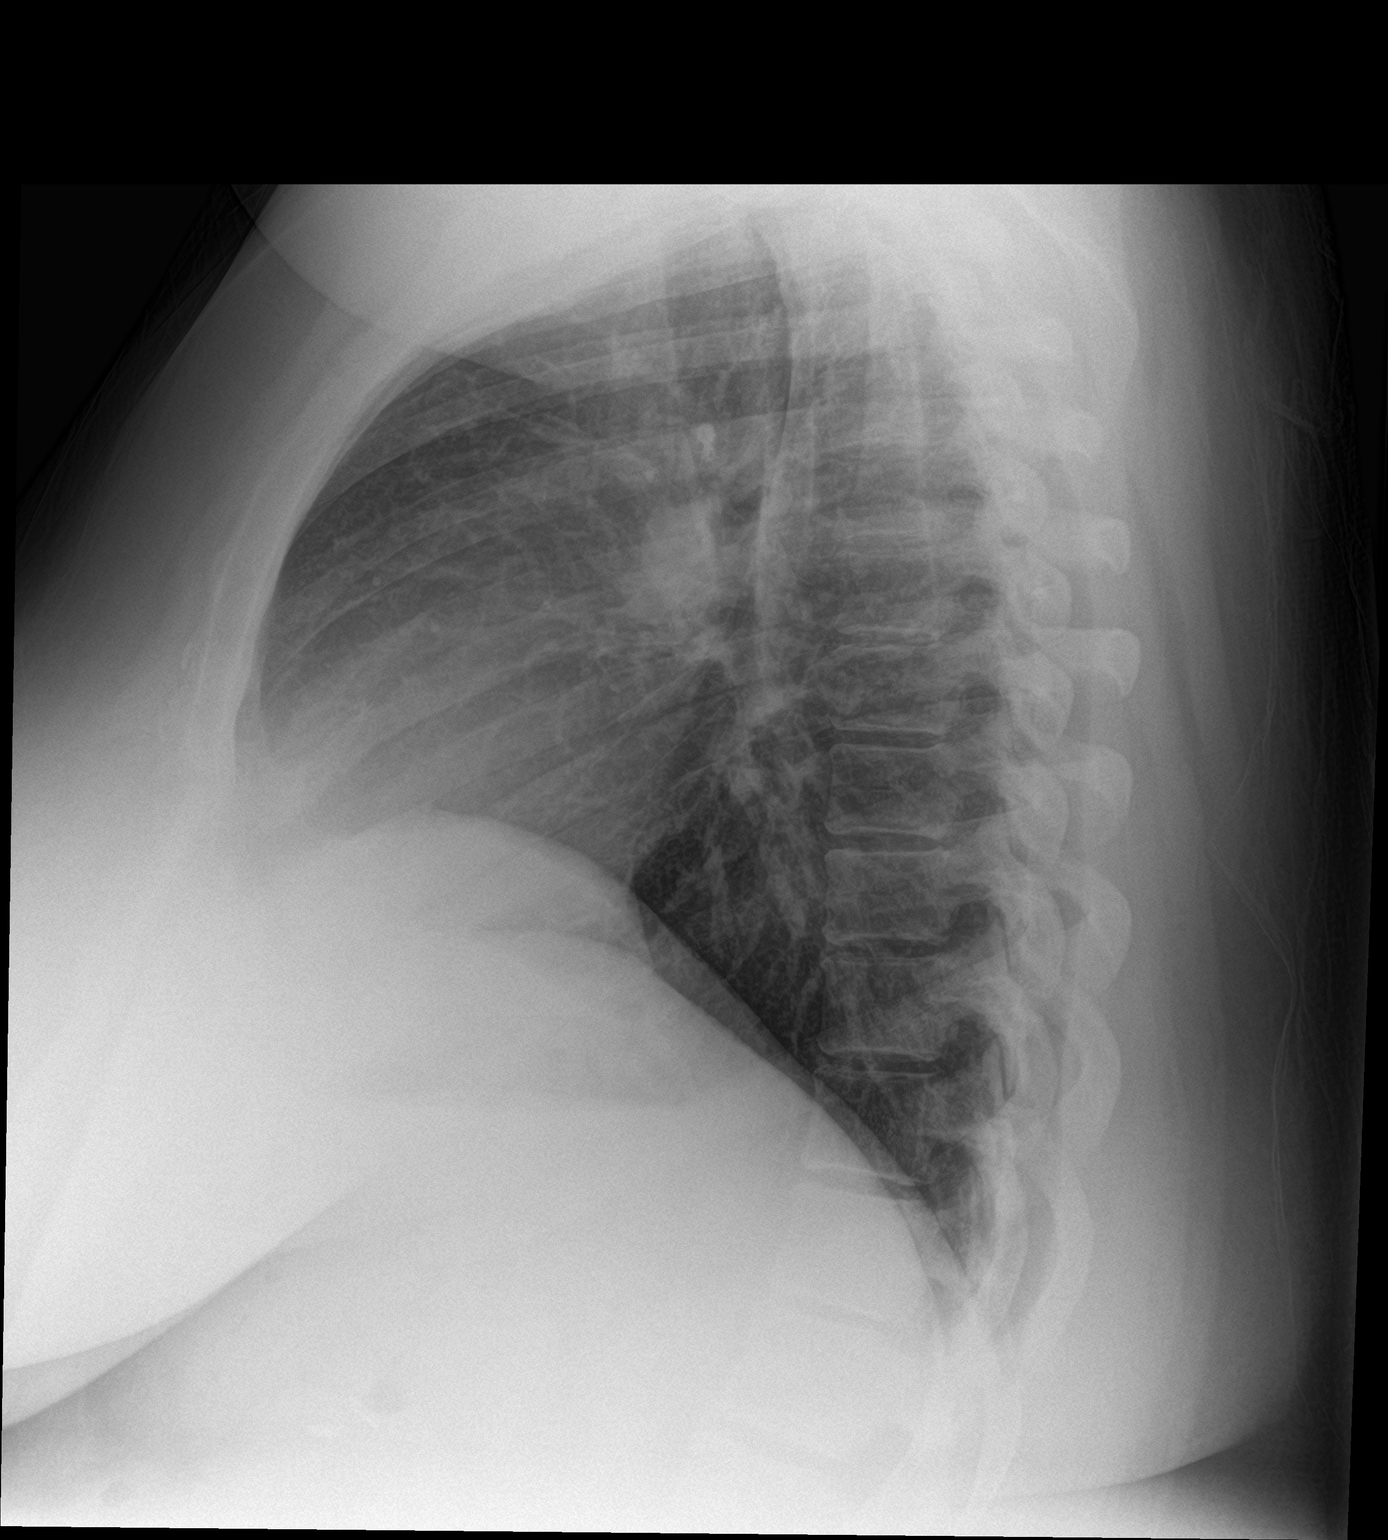

[2 of 2 positions shown; findings below may reference images not displayed]

FINDINGS: The heart size and mediastinal contours are within normal limits.
Both lungs are clear. The visualized skeletal structures are
unremarkable. Surgical clips in the right upper quadrant.
IMPRESSION: No active cardiopulmonary disease.

## 2016-03-12 IMAGING — CT CT ANGIO CHEST
2 of 7 series · 18 of 46 positions shown · IV contrast (isovue)
Comparison: Chest radiograph [DATE] at [TK] hours

CLINICAL DATA: Chest pain with inspiration. On blood thinner for
RIGHT lower extremity deep vein thrombosis diagnosed [DATE].

EXAM:
CT ANGIOGRAPHY CHEST WITH CONTRAST
TECHNIQUE: Multidetector CT imaging of the chest was performed using the
standard protocol during bolus administration of intravenous
contrast. Multiplanar CT image reconstructions and MIPs were
obtained to evaluate the vascular anatomy.
CONTRAST:  75 cc Isovue 370

[Series 7: thins · axial · 0.80mm/px · z∈[-163,+137]mm · 16 of 338 slices shown]
[im 19/338  lung]
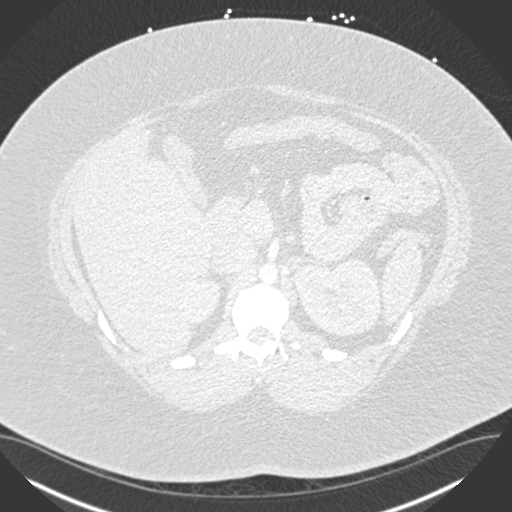
[im 38/338  soft-tissue]
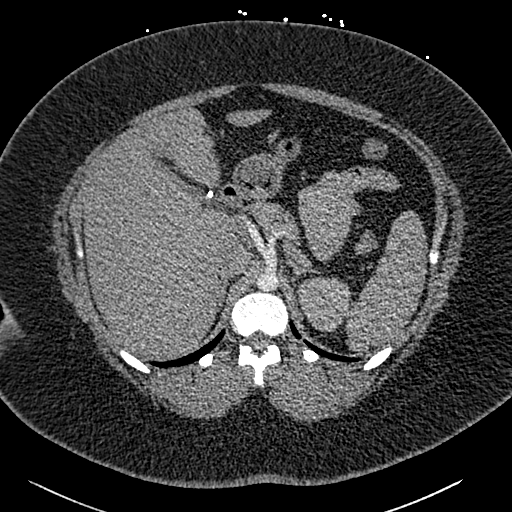
[im 57/338  lung]
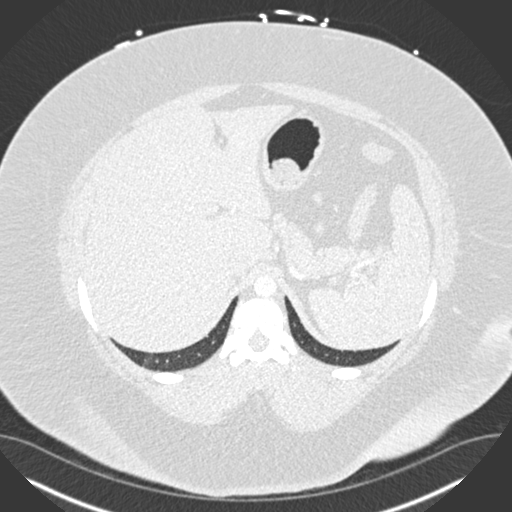
[im 75/338  soft-tissue]
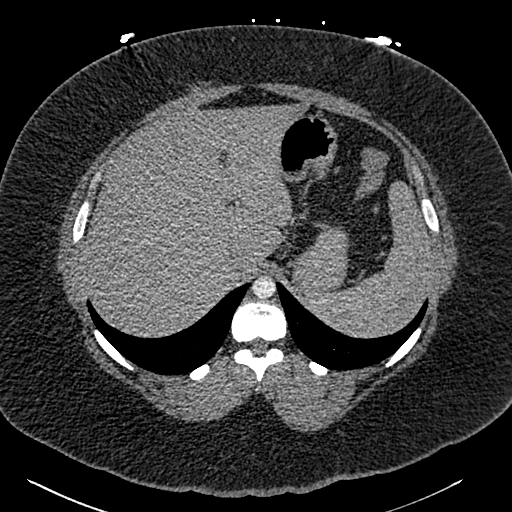
[im 94/338  lung]
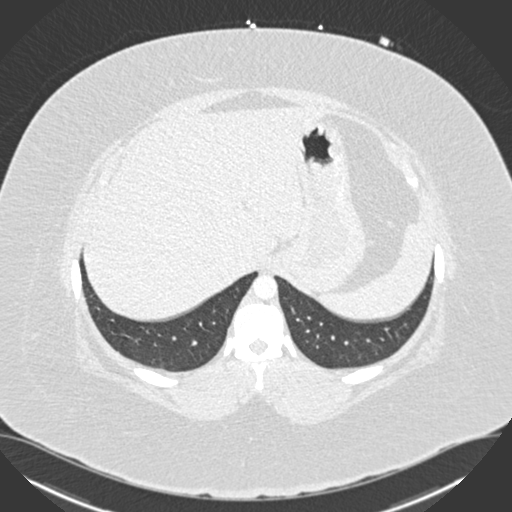
[im 113/338  soft-tissue]
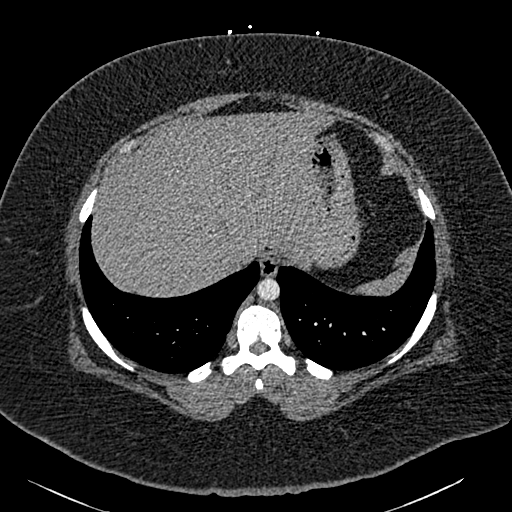
[im 132/338  lung]
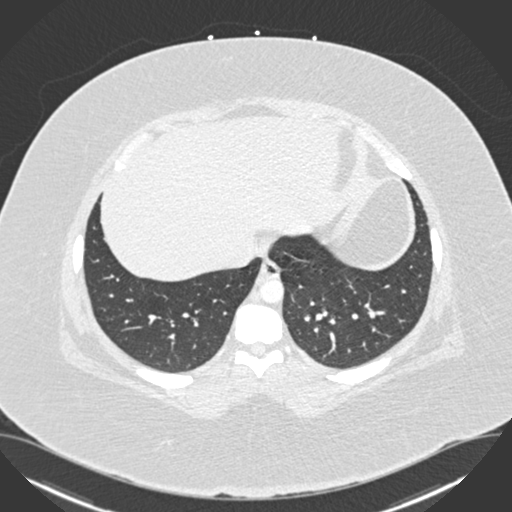
[im 150/338  soft-tissue]
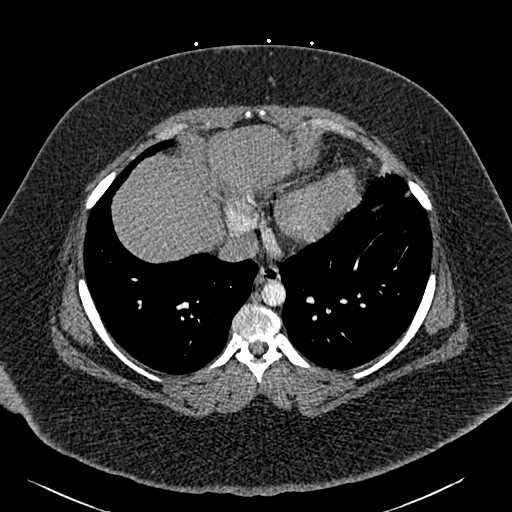
[im 188/338  lung]
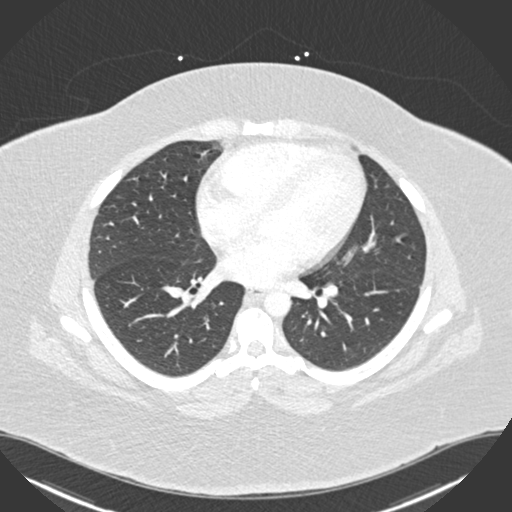
[im 206/338  soft-tissue]
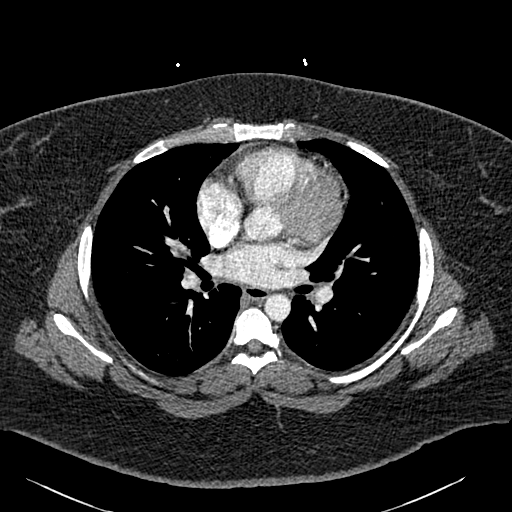
[im 225/338  lung]
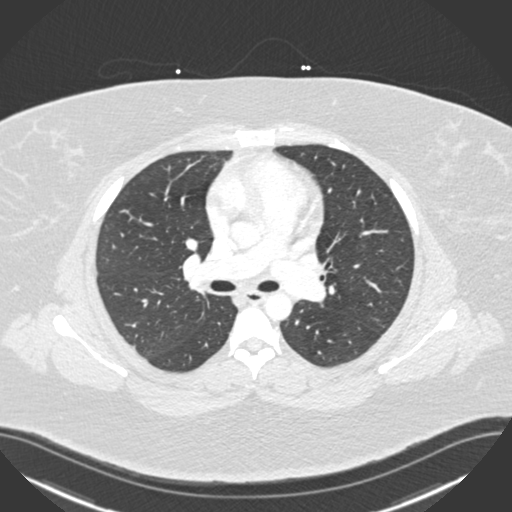
[im 244/338  soft-tissue]
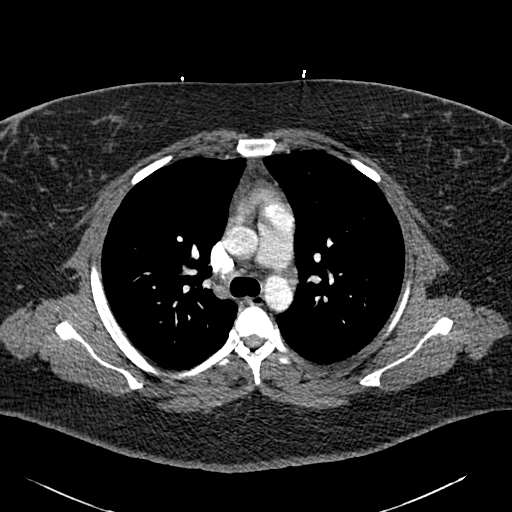
[im 263/338  lung]
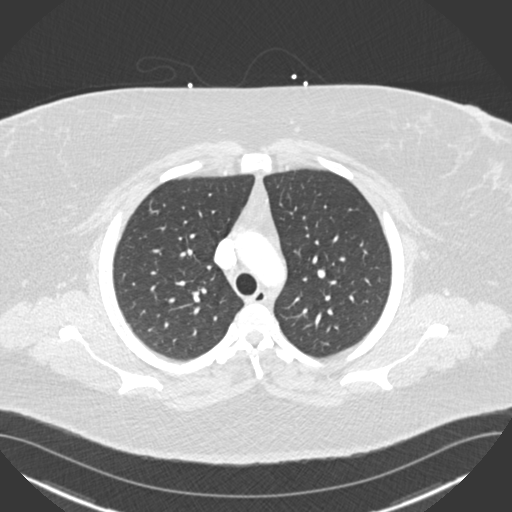
[im 281/338  soft-tissue]
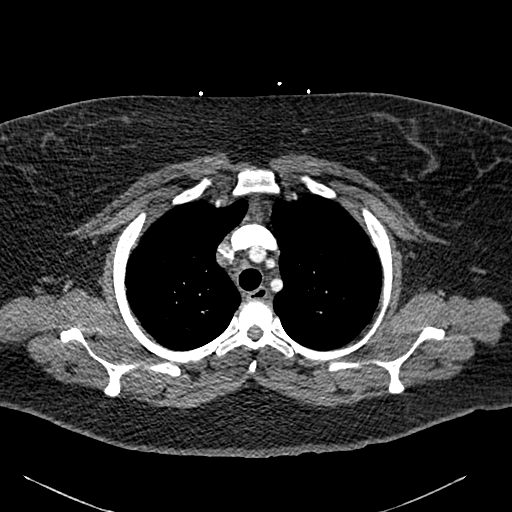
[im 300/338  lung]
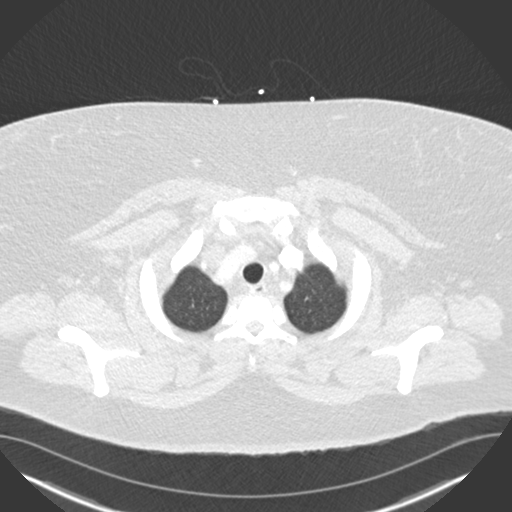
[im 319/338  soft-tissue]
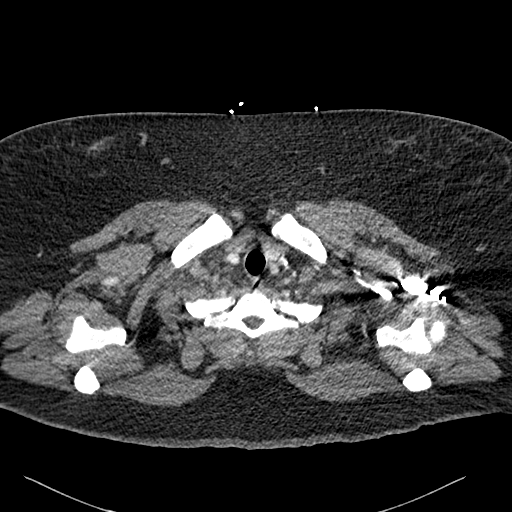

[Series 9: coronal mpr · coronal · 0.66mm/px · 2 of 94 slices shown]
[im 32/94  soft-tissue]
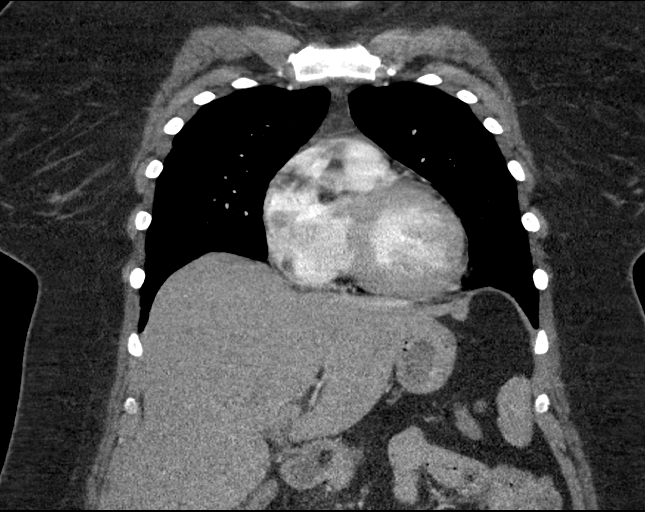
[im 63/94  soft-tissue]
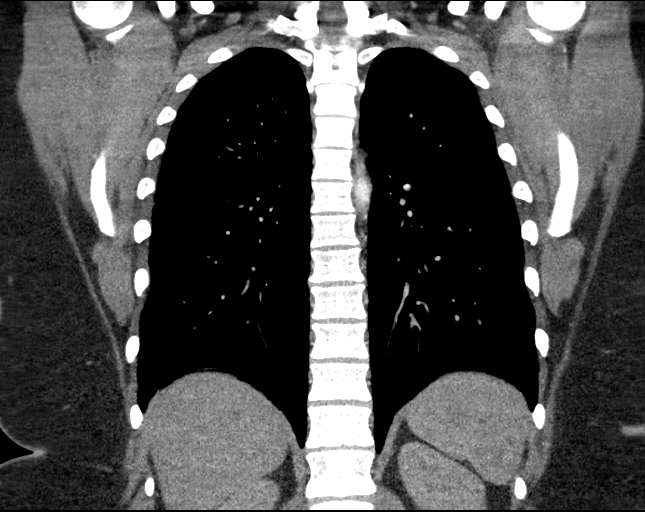

[18 of 46 positions shown; findings below may reference images not displayed]

FINDINGS: Large body habitus results in overall noisy image quality.

CARDIOVASCULAR: Suboptimal contrast opacification of the pulmonary
artery's (164 Hounsfield units, target is 250 Hounsfield units.
Isodense pulmonary artery, aorta and pulmonary veins. Main pulmonary
artery is not enlarged. Linear filling defect RIGHT lower lobar
pulmonary artery, acute RIGHT lower lobe segmental nonocclusive
pulmonary emboli. Probable RIGHT upper lobe nonocclusive
subsegmental PE. Heart size is normal, mild RIGHT heart strain (RV/
LV = 1). No pericardial effusions. Thoracic aorta is normal course
and caliber, unremarkable.

MEDIASTINUM/NODES: No lymphadenopathy by CT size criteria.

LUNGS/PLEURA: Tracheobronchial tree is patent, no pneumothorax. No
pleural effusions, focal consolidations, pulmonary nodules or
masses. Minimal lingular atelectasis.

UPPER ABDOMEN: Included view of the abdomen status post
cholecystectomy. Mild suspected hepatomegaly.

MUSCULOSKELETAL: Visualized soft tissues and included osseous
structures appear normal. Scattered Schmorl's nodes.

Review of the MIP images confirms the above findings.
IMPRESSION: Technically limited examination due to bolus timing and habitus.

Acute nonocclusive RIGHT segmental to subsegmental pulmonary emboli.
CT evidence of right heart strain (RV/LV Ratio = 1) associated with
an increased risk of morbidity and mortality. Please activate Code
PE by paging [[email or web address]].

Critical Value/emergent results were called by telephone at the time
of interpretation on [DATE] at [DATE] to Dr. ARCENIO , who
verbally acknowledged these results.

## 2016-03-12 MED ORDER — PANTOPRAZOLE SODIUM 40 MG PO TBEC
40.0000 mg | DELAYED_RELEASE_TABLET | Freq: Every day | ORAL | Status: DC
  Administered 2016-03-13 – 2016-03-17 (×5): 40 mg via ORAL
  Filled 2016-03-12 (×5): qty 1

## 2016-03-12 MED ORDER — HEPARIN (PORCINE) IN NACL 100-0.45 UNIT/ML-% IJ SOLN
2100.0000 [IU]/h | INTRAMUSCULAR | Status: DC
  Administered 2016-03-12: 1700 [IU]/h via INTRAVENOUS
  Administered 2016-03-13: 1800 [IU]/h via INTRAVENOUS
  Administered 2016-03-13: 2000 [IU]/h via INTRAVENOUS
  Administered 2016-03-14: 2100 [IU]/h via INTRAVENOUS
  Filled 2016-03-12 (×5): qty 250

## 2016-03-12 MED ORDER — HEPARIN BOLUS VIA INFUSION
3000.0000 [IU] | Freq: Once | INTRAVENOUS | Status: AC
  Administered 2016-03-12: 3000 [IU] via INTRAVENOUS
  Filled 2016-03-12: qty 3000

## 2016-03-12 MED ORDER — HYDROCODONE-ACETAMINOPHEN 5-325 MG PO TABS
1.0000 | ORAL_TABLET | ORAL | Status: DC | PRN
  Administered 2016-03-13 (×2): 2 via ORAL
  Administered 2016-03-13: 1 via ORAL
  Administered 2016-03-14 – 2016-03-15 (×3): 2 via ORAL
  Filled 2016-03-12: qty 1
  Filled 2016-03-12 (×5): qty 2

## 2016-03-12 MED ORDER — FERROUS SULFATE 325 (65 FE) MG PO TABS
325.0000 mg | ORAL_TABLET | Freq: Three times a day (TID) | ORAL | Status: DC
Start: 2016-03-13 — End: 2016-03-15
  Administered 2016-03-13 – 2016-03-14 (×6): 325 mg via ORAL
  Filled 2016-03-12 (×7): qty 1

## 2016-03-12 MED ORDER — SODIUM CHLORIDE 0.9% FLUSH
3.0000 mL | Freq: Two times a day (BID) | INTRAVENOUS | Status: DC
  Administered 2016-03-12 – 2016-03-16 (×7): 3 mL via INTRAVENOUS

## 2016-03-12 MED ORDER — IOPAMIDOL (ISOVUE-370) INJECTION 76%
75.0000 mL | Freq: Once | INTRAVENOUS | Status: AC | PRN
  Administered 2016-03-12: 75 mL via INTRAVENOUS

## 2016-03-12 MED ORDER — ONDANSETRON HCL 4 MG/2ML IJ SOLN: 4.0000 mg | Freq: Four times a day (QID) | INTRAMUSCULAR | Status: DC | PRN

## 2016-03-12 MED ORDER — GABAPENTIN 300 MG PO CAPS
300.0000 mg | ORAL_CAPSULE | Freq: Three times a day (TID) | ORAL | Status: DC
  Administered 2016-03-12 – 2016-03-17 (×14): 300 mg via ORAL
  Filled 2016-03-12 (×14): qty 1

## 2016-03-12 MED ORDER — ACETAMINOPHEN 325 MG PO TABS: 650.0000 mg | ORAL_TABLET | Freq: Four times a day (QID) | ORAL | Status: DC | PRN

## 2016-03-12 MED ORDER — ACETAMINOPHEN 650 MG RE SUPP: 650.0000 mg | Freq: Four times a day (QID) | RECTAL | Status: DC | PRN

## 2016-03-12 MED ORDER — SODIUM CHLORIDE 0.9 % IV SOLN
30.0000 meq | Freq: Once | INTRAVENOUS | Status: AC
  Administered 2016-03-12: 30 meq via INTRAVENOUS
  Filled 2016-03-12: qty 15

## 2016-03-12 MED ORDER — CLONAZEPAM 0.5 MG PO TABS
0.5000 mg | ORAL_TABLET | Freq: Two times a day (BID) | ORAL | Status: DC | PRN
  Administered 2016-03-17: 0.5 mg via ORAL
  Filled 2016-03-12: qty 1

## 2016-03-12 MED ORDER — AMLODIPINE BESYLATE 5 MG PO TABS
5.0000 mg | ORAL_TABLET | Freq: Every day | ORAL | Status: DC
  Administered 2016-03-13 – 2016-03-17 (×5): 5 mg via ORAL
  Filled 2016-03-12 (×5): qty 1

## 2016-03-12 MED ORDER — MAGNESIUM SULFATE 2 GM/50ML IV SOLN
2.0000 g | Freq: Once | INTRAVENOUS | Status: AC
Start: 2016-03-12 — End: 2016-03-12
  Administered 2016-03-12: 2 g via INTRAVENOUS
  Filled 2016-03-12: qty 50

## 2016-03-12 MED ORDER — ONDANSETRON HCL 4 MG PO TABS
4.0000 mg | ORAL_TABLET | Freq: Four times a day (QID) | ORAL | Status: DC | PRN
  Administered 2016-03-14: 4 mg via ORAL
  Filled 2016-03-12: qty 1

## 2016-03-12 MED ORDER — POTASSIUM CHLORIDE CRYS ER 20 MEQ PO TBCR
40.0000 meq | EXTENDED_RELEASE_TABLET | Freq: Once | ORAL | Status: AC
  Administered 2016-03-12: 40 meq via ORAL
  Filled 2016-03-12: qty 2

## 2016-03-12 MED ORDER — ACETAZOLAMIDE 250 MG PO TABS
500.0000 mg | ORAL_TABLET | Freq: Two times a day (BID) | ORAL | Status: DC
  Administered 2016-03-12 – 2016-03-17 (×10): 500 mg via ORAL
  Filled 2016-03-12 (×11): qty 2

## 2016-03-12 NOTE — Progress Notes (Addendum)
ANTICOAGULATION CONSULT NOTE - Initial Consult  Pharmacy Consult for heparin drip Indication: pulmonary embolus  Allergies  Allergen Reactions  . Lisinopril Cough    Patient Measurements: Height: 5\' 9"  (175.3 cm) Weight: (!) 358 lb (162.4 kg) IBW/kg (Calculated) : 66.2 Heparin Dosing Weight: 106 kg  Vital Signs: Temp: 98.3 F (36.8 C) (11/30 1705) Temp Source: Oral (11/30 1705) BP: 147/52 (11/30 1912) Pulse Rate: 98 (11/30 1912)  Labs:  Recent Labs  03/12/16 1715  HGB 9.6*  HCT 29.8*  PLT 347  APTT 60*  LABPROT 21.6*  INR 1.85  CREATININE 0.77  TROPONINI <0.03    Estimated Creatinine Clearance: 171.5 mL/min (by C-G formula based on SCr of 0.77 mg/dL).   Medical History: Past Medical History:  Diagnosis Date  . Abnormal vaginal bleeding   . Anxiety   . Depression   . Fibroid   . Hypertension   . Obesity   . Sinus congestion     Medications:   (Not in a hospital admission) Scheduled:  . heparin  3,000 Units Intravenous Once  . potassium chloride (KCL MULTIRUN) 30 mEq in 265 mL IVPB  30 mEq Intravenous Once   Infusions:  . heparin    . magnesium sulfate 1 - 4 g bolus IVPB 2 g (03/12/16 1912)    Assessment: Pharmacy consulted to dose and monitor heparin drip in this 30 year old female for a newly diagnosed PE. Patient was recently diagnosed with a DVT and prescribed rivaroxaban 15 mg PO BID x 21 days followed by 20 mg once daily.  Today is day #17 of rivaroxaban BID treatment dose. Patient reports compliance with last dose taken at 0830 this morning. She presents with SOB and is diagnosed with a new PE this evening. Per MD, this is a rivaroxaban treatment failure.  Baseline labs ordered in ED.  Goal of Therapy:  Goal APTT 66 - 102 s Heparin level 0.3-0.7 units/ml Monitor platelets by anticoagulation protocol: Yes   Plan:  Give 3000 units bolus x 1 (will give half-bolus as patient took rivaroxaban this morning) Start heparin infusion at 1700  units/hr Check anti-Xa level in 6 hours and daily while on heparin Continue to monitor H&H and platelets  Will need to monitor APTT if baseline HL is elevated  Lenis Noon, PharmD, BCPS Clinical Pharmacist 03/12/2016,8:10 PM

## 2016-03-12 NOTE — H&P (Signed)
[facility] NAME: [name]    MR#:  [id number]  DATE OF BIRTH:  1985/08/10  DATE OF ADMISSION:  03/12/2016  PRIMARY CARE PHYSICIAN: Suzanna Obey, MD    REQUESTING/REFERRING PHYSICIAN: Karma Greaser, MD  CHIEF COMPLAINT:   Chief Complaint  Patient presents with  . Chest Pain    HISTORY OF PRESENT ILLNESS:  Heather Cherry  is a 30 y.o. female who presents with Acute onset chest pain and shortness of breath. Patient was diagnosed a couple weeks ago with a DVT and is been on Xarelto since that time. This DVT was likely provoked as the patient was on oral contraception and had also recently taken long trip in her car. However, she comes in tonight with chest pain and was diagnosed with a PE on CTA chest. Patient has been wearing TED hose since the time of her diagnosis of DVT. It is unclear whether this PE represents new clot or perhaps a broken piece of her DVT which migrated to her lungs. Hospitals were called for admission and she was placed on a heparin drip.  PAST MEDICAL HISTORY:   Past Medical History:  Diagnosis Date  . Abnormal vaginal bleeding   . Anxiety   . Depression   . DVT (deep venous thrombosis) ([place])   . Fibroid   . Hypertension   . Obesity   . Sinus congestion     PAST SURGICAL HISTORY:   Past Surgical History:  Procedure Laterality Date  . CHOLECYSTECTOMY    . WISDOM TOOTH EXTRACTION      SOCIAL HISTORY:   Social History  Substance Use Topics  . Smoking status: Never Smoker  . Smokeless tobacco: Never Used  . Alcohol use Yes     Comment: occas. social    FAMILY HISTORY:   Family History  Problem Relation Age of Onset  . Cancer Paternal Grandfather     DRUG ALLERGIES:   Allergies  Allergen Reactions  . Lisinopril Cough    MEDICATIONS AT HOME:   Prior to Admission medications   Medication Sig Start Date End Date Taking? Authorizing Provider  acetaZOLAMIDE (DIAMOX) 250 MG  tablet Take 500 mg by mouth 2 (two) times daily.   Yes Historical Provider, MD  amLODipine (NORVASC) 5 MG tablet Take 5 mg by mouth daily.   Yes Historical Provider, MD  clonazePAM (KLONOPIN) 0.5 MG tablet Take 0.5 mg by mouth 2 (two) times daily as needed for anxiety.   Yes Historical Provider, MD  ferrous sulfate 325 (65 FE) MG tablet Take 325 mg by mouth 3 (three) times daily with meals.   Yes Historical Provider, MD  gabapentin (NEURONTIN) 300 MG capsule Take 300 mg by mouth 3 (three) times daily.   Yes Historical Provider, MD  hydrochlorothiazide (HYDRODIURIL) 25 MG tablet Take 25 mg by mouth daily.    Yes Historical Provider, MD  HYDROcodone-acetaminophen (NORCO/VICODIN) 5-325 MG tablet Take 1-2 tablets by mouth every 4 (four) hours as needed for moderate pain.   Yes Historical Provider, MD  metFORMIN (GLUCOPHAGE-XR) 500 MG 24 hr tablet Take 1,000 mg by mouth daily with breakfast.   Yes Historical Provider, MD  omeprazole (PRILOSEC) 20 MG capsule Take 20 mg by mouth daily.   Yes Historical Provider, MD  Rivaroxaban 15 & 20 MG TBPK Take as directed on package: Start with one 15mg  tablet by mouth twice a day with food. On Day 22, switch to one 20mg  tablet once a day  with food. Patient taking differently: Take 15-20 mg by mouth 2 (two) times daily. Take as directed on package: Start with one 15mg  tablet by mouth twice a day with food. On Day 22, switch to one 20mg  tablet once a day with food. 02/24/16  Yes Merlyn Lot, MD    REVIEW OF SYSTEMS:  Review of Systems  Constitutional: Negative for chills, fever, malaise/fatigue and weight loss.  HENT: Negative for ear pain, hearing loss and tinnitus.   Eyes: Negative for blurred vision, double vision, pain and redness.  Respiratory: Positive for shortness of breath. Negative for cough and hemoptysis.   Cardiovascular: Positive for chest pain. Negative for palpitations, orthopnea and leg swelling.  Gastrointestinal: Negative for abdominal pain,  constipation, diarrhea, nausea and vomiting.  Genitourinary: Negative for dysuria, frequency and hematuria.  Musculoskeletal: Negative for back pain, joint pain and neck pain.  Skin:       No acne, rash, or lesions  Neurological: Negative for dizziness, tremors, focal weakness and weakness.  Endo/Heme/Allergies: Negative for polydipsia. Does not bruise/bleed easily.  Psychiatric/Behavioral: Negative for depression. The patient is not nervous/anxious and does not have insomnia.      VITAL SIGNS:   Vitals:   03/12/16 1705 03/12/16 1831 03/12/16 1912 03/12/16 2028  BP: 120/81 123/64 (!) 147/52 (!) 136/47  Pulse: (!) 102 99 98 (!) 101  Resp: 20 13 16 18   Temp: 98.3 F (36.8 C)   98.2 F (36.8 C)  TempSrc: Oral   Oral  SpO2: 99% 100% 100% 99%  Weight:      Height:       Wt Readings from Last 3 Encounters:  03/12/16 (!) 162.4 kg (358 lb)  02/24/16 (!) 170.1 kg (375 lb)  12/17/15 (!) 173.7 kg (383 lb)    PHYSICAL EXAMINATION:  Physical Exam  Vitals reviewed. Constitutional: She is oriented to person, place, and time. She appears well-developed and well-nourished. No distress.  HENT:  Head: Normocephalic and atraumatic.  Mouth/Throat: Oropharynx is clear and moist.  Eyes: Conjunctivae and EOM are normal. Pupils are equal, round, and reactive to light. No scleral icterus.  Neck: Normal range of motion. Neck supple. No JVD present. No thyromegaly present.  Cardiovascular: Normal rate, regular rhythm and intact distal pulses.  Exam reveals no gallop and no friction rub.   No murmur heard. Respiratory: Effort normal and breath sounds normal. No respiratory distress. She has no wheezes. She has no rales.  GI: Soft. Bowel sounds are normal. She exhibits no distension. There is no tenderness.  Musculoskeletal: Normal range of motion. She exhibits no edema.  No arthritis, no gout  Lymphadenopathy:    She has no cervical adenopathy.  Neurological: She is alert and oriented to person,  place, and time. No cranial nerve deficit.  No dysarthria, no aphasia  Skin: Skin is warm and dry. No rash noted. No erythema.  Psychiatric: She has a normal mood and affect. Her behavior is normal. Judgment and thought content normal.    LABORATORY PANEL:   CBC  Recent Labs Lab 03/12/16 1715  WBC 9.3  HGB 9.6*  HCT 29.8*  PLT 347   ------------------------------------------------------------------------------------------------------------------  Chemistries   Recent Labs Lab 03/12/16 1715  NA 137  K 2.3*  CL 102  CO2 26  GLUCOSE 123*  BUN 10  CREATININE 0.77  CALCIUM 9.1   ------------------------------------------------------------------------------------------------------------------  Cardiac Enzymes  Recent Labs Lab 03/12/16 1715  TROPONINI <0.03   ------------------------------------------------------------------------------------------------------------------  RADIOLOGY:  Dg Chest 2 View  Result Date:  03/12/2016 CLINICAL DATA:  Chest pain under the right breast EXAM: CHEST  2 VIEW COMPARISON:  None. FINDINGS: The heart size and mediastinal contours are within normal limits. Both lungs are clear. The visualized skeletal structures are unremarkable. Surgical clips in the right upper quadrant. IMPRESSION: No active cardiopulmonary disease. Electronically Signed   By: Donavan Foil M.D.   On: 03/12/2016 18:34   Ct Angio Chest Pe W Or Wo Contrast  Result Date: 03/12/2016 CLINICAL DATA:  Chest pain with inspiration. On blood thinner for RIGHT lower extremity deep vein thrombosis diagnosed February 24, 2016. EXAM: CT ANGIOGRAPHY CHEST WITH CONTRAST TECHNIQUE: Multidetector CT imaging of the chest was performed using the standard protocol during bolus administration of intravenous contrast. Multiplanar CT image reconstructions and MIPs were obtained to evaluate the vascular anatomy. CONTRAST:  75 cc Isovue 370 COMPARISON:  Chest radiograph March 12, 2016 at 1829  hours FINDINGS: Large body habitus results in overall noisy image quality. CARDIOVASCULAR: Suboptimal contrast opacification of the pulmonary artery's (164 Hounsfield units, target is 250 Hounsfield units. Isodense pulmonary artery, aorta and pulmonary veins. Main pulmonary artery is not enlarged. Linear filling defect RIGHT lower lobar pulmonary artery, acute RIGHT lower lobe segmental nonocclusive pulmonary emboli. Probable RIGHT upper lobe nonocclusive subsegmental PE. Heart size is normal, mild RIGHT heart strain (RV/ LV = 1). No pericardial effusions. Thoracic aorta is normal course and caliber, unremarkable. MEDIASTINUM/NODES: No lymphadenopathy by CT size criteria. LUNGS/PLEURA: Tracheobronchial tree is patent, no pneumothorax. No pleural effusions, focal consolidations, pulmonary nodules or masses. Minimal lingular atelectasis. UPPER ABDOMEN: Included view of the abdomen status post cholecystectomy. Mild suspected hepatomegaly. MUSCULOSKELETAL: Visualized soft tissues and included osseous structures appear normal. Scattered Schmorl's nodes. Review of the MIP images confirms the above findings. IMPRESSION: Technically limited examination due to bolus timing and habitus. Acute nonocclusive RIGHT segmental to subsegmental pulmonary emboli. CT evidence of right heart strain (RV/LV Ratio = 1) associated with an increased risk of morbidity and mortality. Please activate Code PE by paging [phone]. Critical Value/emergent results were called by telephone at the time of interpretation on 03/12/2016 at 7:15 pm to Dr. Hinda Kehr , who verbally acknowledged these results. Electronically Signed   By: Elon Alas M.D.   On: 03/12/2016 19:16    EKG:   Orders placed or performed during the hospital encounter of 03/12/16  . EKG 12-Lead  . EKG 12-Lead  . ED EKG within 10 minutes  . ED EKG within 10 minutes    IMPRESSION AND PLAN:  Principal Problem:   PE (pulmonary thromboembolism) ([place]) - unclear  if this represents new clot despite being on anticoagulation or whether this was potentially a piece of her DVT which broke off and migrated to her lungs. She is hemodynamically stable with good oxygen saturation. Her tachycardia responded quickly and corrected. Her symptoms are reduced. She is on a heparin drip, will keep her on that tonight. She will likely need to change her oral anticoagulant. Get an echocardiogram in the morning to reevaluate her mild right heart strain. Active Problems:   DVT (deep venous thrombosis) ([place]) - treatment as above   HTN (hypertension) - stable, continue home meds   Anxiety - continue home dose anxiolytics   Hyperglycemia - patient does not have an official diagnosis of diabetes that I can find. She is on metformin. We will hold her oral anti-glycemic keep her on sliding scale insulin while here.   GERD (gastroesophageal reflux disease) - home dose PPI  All the records  are reviewed and case discussed with ED provider. Management plans discussed with the patient and/or family.  DVT PROPHYLAXIS: Systemic anticoagulation  GI PROPHYLAXIS: PPI  ADMISSION STATUS: Inpatient  CODE STATUS: Full Code Status History    This patient does not have a recorded code status. Please follow your organizational policy for patients in this situation.      TOTAL TIME TAKING CARE OF THIS PATIENT: 45 minutes.    Alessandra Sawdey FIELDING 03/12/2016, 8:55 PM  [facility]  Office  [phone]  CC: Primary care physician; Suzanna Obey, MD

## 2016-03-12 NOTE — ED Provider Notes (Signed)
[facility] Emergency Department Provider Note  ____________________________________________   First MD Initiated Contact with Patient 03/12/16 1822     (approximate)  I have reviewed the triage vital signs and the nursing notes.   HISTORY  Chief Complaint Chest Pain    HPI Heather Cherry is a 30 y.o. 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  She presents today for evaluation of chest pain.  She states that started acutely, is mild to moderate in intensity, feels sharp, and is worse with deep breaths.  He never felt these symptoms before and is concerned given  Her recent diagnosis of blood clot.  She denies difficulty breathing except for the pain with deep inspiration.  She denies fever/chills, nausea and vomiting.  She does report that she has had diarrhea for weeks but "just thought that was normal".  She states she has been eating less than usual lately because her appetite has decreased.  She has been told in the past that her potassium is low but it is usually at or just below 3.  She has taken supplements in the past but is not currently taking them.  Of note, she takes Diamox for pseudotumor cerebri.   Past Medical History:  Diagnosis Date  . Abnormal vaginal bleeding   . Anxiety   . Depression   . Fibroid   . Hypertension   . Obesity   . Sinus congestion     There are no active problems to display for this patient.   Past Surgical History:  Procedure Laterality Date  . CHOLECYSTECTOMY    . WISDOM TOOTH EXTRACTION      Prior to Admission medications   Medication Sig Start Date End Date Taking? Authorizing Provider  clonazePAM (KLONOPIN) 0.5 MG tablet Take 0.5 mg by mouth 2 (two) times daily as needed for anxiety.    Historical Provider, MD  hydrochlorothiazide (HYDRODIURIL) 25 MG tablet Take  12.5 mg by mouth daily.    Historical Provider, MD  levonorgestrel-ethinyl estradiol (LYBREL,AMETHYST) 90-20 MCG tablet Take 1 tablet by mouth daily.    Historical Provider, MD  norgestimate-ethinyl estradiol (ORTHO-CYCLEN,SPRINTEC,PREVIFEM) 0.25-35 MG-MCG tablet Take 1 tablet by mouth daily.    Historical Provider, MD  phentermine 37.5 MG capsule Take 37.5 mg by mouth every morning.    Historical Provider, MD  Rivaroxaban 15 & 20 MG TBPK Take as directed on package: Start with one 15mg  tablet by mouth twice a day with food. On Day 22, switch to one 20mg  tablet once a day with food. 02/24/16   Merlyn Lot, MD    Allergies Patient has no known allergies.  No family history on file.  Social History Social History  Substance Use Topics  . Smoking status: Never Smoker  . Smokeless tobacco: Never Used  . Alcohol use Yes     Comment: occas. social    Review of Systems Constitutional: No fever/chills Eyes: No visual changes. ENT: No sore throat. Cardiovascular: Pleuritic chest pain, worse with deep inspiration Respiratory: Denies shortness of breath but pain with deep inspiration Gastrointestinal: No abdominal pain.  No nausea, no vomiting.  Diarrhea for several weeks.  No constipation. Genitourinary: Negative for dysuria. Musculoskeletal: Negative for back pain. Skin: Negative for rash. Neurological: Negative for headaches, focal weakness or numbness.  10-point ROS otherwise negative.  ____________________________________________   PHYSICAL EXAM:  VITAL SIGNS:  ED Triage Vitals  Enc Vitals Group     BP 03/12/16 1705 120/81     Pulse Rate 03/12/16 1705 (!) 102     Resp 03/12/16 1705 20     Temp 03/12/16 1705 98.3 F (36.8 C)     Temp Source 03/12/16 1705 Oral     SpO2 03/12/16 1705 99 %     Weight 03/12/16 1704 (!) 358 lb (162.4 kg)     Height 03/12/16 1704 5\' 9"  (1.753 m)     Head Circumference --      Peak Flow --      Pain Score 03/12/16 1707 7     Pain Loc --        Pain Edu? --      Excl. in [place]? --     Constitutional: Alert and oriented. Well appearing and in no acute distress. Eyes: Conjunctivae are normal. PERRL. EOMI. Head: Atraumatic. Nose: No congestion/rhinnorhea. Mouth/Throat: Mucous membranes are moist.  Oropharynx non-erythematous. Neck: No stridor.  No meningeal signs.   Cardiovascular: Normal rate, regular rhythm. Good peripheral circulation. Grossly normal heart sounds. Respiratory: Normal respiratory effort.  No retractions. Lungs CTAB. Gastrointestinal: Morbid obesity.  Soft and nontender. No distention.  Musculoskeletal: No lower extremity tenderness nor edema. No gross deformities of extremities. Neurologic:  Normal speech and language. No gross focal neurologic deficits are appreciated.  Skin:  Skin is warm, dry and intact. No rash noted. Psychiatric: Mood and affect are normal. Speech and behavior are normal.  ____________________________________________   LABS (all labs ordered are listed, but only abnormal results are displayed)  Labs Reviewed  BASIC METABOLIC PANEL - Abnormal; Notable for the following:       Result Value   Potassium 2.3 (*)    Glucose, Bld 123 (*)    All other components within normal limits  CBC - Abnormal; Notable for the following:    Hemoglobin 9.6 (*)    HCT 29.8 (*)    MCV 66.1 (*)    MCH 21.2 (*)    RDW 20.1 (*)    All other components within normal limits  PROTIME-INR - Abnormal; Notable for the following:    Prothrombin Time 21.6 (*)    All other components within normal limits  APTT - Abnormal; Notable for the following:    aPTT 60 (*)    All other components within normal limits  TROPONIN I  MAGNESIUM  HEPATIC FUNCTION PANEL  HEPARIN LEVEL (UNFRACTIONATED)   ____________________________________________  EKG  ED ECG REPORT I, Drevion Offord, the attending physician, personally viewed and interpreted this ECG.  Date: 03/12/2016 EKG Time: 17:03 Rate: 102 Rhythm:  Borderline sinus tachycardia QRS Axis: normal Intervals: normal with LVH ST/T Wave abnormalities: Non-specific ST segment / T-wave changes, but no evidence of acute ischemia. Conduction Disturbances: none Narrative Interpretation: unremarkable  ____________________________________________  RADIOLOGY   Dg Chest 2 View  Result Date: 03/12/2016 CLINICAL DATA:  Chest pain under the right breast EXAM: CHEST  2 VIEW COMPARISON:  None. FINDINGS: The heart size and mediastinal contours are within normal limits. Both lungs are clear. The visualized skeletal structures are unremarkable. Surgical clips in the right upper quadrant. IMPRESSION: No active cardiopulmonary disease. Electronically Signed   By: Donavan Foil M.D.   On: 03/12/2016 18:34   Ct Angio Chest Pe W Or Wo Contrast  Result Date: 03/12/2016 CLINICAL DATA:  Chest pain with inspiration. On blood thinner for RIGHT lower extremity deep vein thrombosis diagnosed February 24, 2016. EXAM: CT  ANGIOGRAPHY CHEST WITH CONTRAST TECHNIQUE: Multidetector CT imaging of the chest was performed using the standard protocol during bolus administration of intravenous contrast. Multiplanar CT image reconstructions and MIPs were obtained to evaluate the vascular anatomy. CONTRAST:  75 cc Isovue 370 COMPARISON:  Chest radiograph March 12, 2016 at 1829 hours FINDINGS: Large body habitus results in overall noisy image quality. CARDIOVASCULAR: Suboptimal contrast opacification of the pulmonary artery's (164 Hounsfield units, target is 250 Hounsfield units. Isodense pulmonary artery, aorta and pulmonary veins. Main pulmonary artery is not enlarged. Linear filling defect RIGHT lower lobar pulmonary artery, acute RIGHT lower lobe segmental nonocclusive pulmonary emboli. Probable RIGHT upper lobe nonocclusive subsegmental PE. Heart size is normal, mild RIGHT heart strain (RV/ LV = 1). No pericardial effusions. Thoracic aorta is normal course and caliber, unremarkable.  MEDIASTINUM/NODES: No lymphadenopathy by CT size criteria. LUNGS/PLEURA: Tracheobronchial tree is patent, no pneumothorax. No pleural effusions, focal consolidations, pulmonary nodules or masses. Minimal lingular atelectasis. UPPER ABDOMEN: Included view of the abdomen status post cholecystectomy. Mild suspected hepatomegaly. MUSCULOSKELETAL: Visualized soft tissues and included osseous structures appear normal. Scattered Schmorl's nodes. Review of the MIP images confirms the above findings. IMPRESSION: Technically limited examination due to bolus timing and habitus. Acute nonocclusive RIGHT segmental to subsegmental pulmonary emboli. CT evidence of right heart strain (RV/LV Ratio = 1) associated with an increased risk of morbidity and mortality. Please activate Code PE by paging [phone]. Critical Value/emergent results were called by telephone at the time of interpretation on 03/12/2016 at 7:15 pm to Dr. Hinda Kehr , who verbally acknowledged these results. Electronically Signed   By: Elon Alas M.D.   On: 03/12/2016 19:16    ____________________________________________   PROCEDURES  Procedure(s) performed:   .Critical Care Performed by: Hinda Kehr Authorized by: Hinda Kehr   Critical care provider statement:    Critical care time (minutes):  30   Critical care time was exclusive of:  Separately billable procedures and treating other patients   Critical care was necessary to treat or prevent imminent or life-threatening deterioration of the following conditions:  Circulatory failure, respiratory failure and metabolic crisis   Critical care was time spent personally by me on the following activities:  Development of treatment plan with patient or surrogate, discussions with consultants, evaluation of patient's response to treatment, examination of patient, obtaining history from patient or surrogate, ordering and performing treatments and interventions, ordering and review of  laboratory studies, ordering and review of radiographic studies, pulse oximetry, re-evaluation of patient's condition and review of old charts     Critical Care performed: Yes, see critical care procedure note(s) ____________________________________________   INITIAL IMPRESSION / [place] / ED COURSE  Pertinent labs & imaging results that were available during my care of the patient were reviewed by me and considered in my medical decision making (see chart for details).  The patient has borderline tachycardia, pleuritic chest pain, and a known DVT.  Even though PE is unlikely given that she is on Xarelto and the rest of her exam and vital signs are reassuring, I feel that it is necessary to rule out to the the best of our ability that she has a pulmonary embolism because failure of Xarelto would be important to know.  Of note, her potassium is 2.3.  This meets criteria for inpatient treatment as it cannot be rapidly corrected to a "safe" range.  Her EKG is reassuring with no QT prolongation nor increased amplitude of P waves, but I am giving her  potassium 40 mEq by mouth, checking a magnesium level, and empirically giving magnesium 2 g by IV.  I suspect her potassium is low due to a combination of her chronic diarrhea and her Diamox.  I have explained all this to the patient and she agrees with the current plan for CT angiography chest to rule out PE followed by admission for her hypokalemia.   Clinical Course as of Mar 12 1946  Thu Mar 12, 2016  1856 Normal DG Chest 2 View [CF]  [phone] Dr. Dorann Lodge (radiology) called to let me know that the patient has multiple small acute PEs with borderline right heart strain.  Given failure of Xarelto, will start on heparin and admit (was going to admit anyway due to potassium).  [CF]  1927 Calling "code PE" as recommended in the radiology written report, though the patient is hemodynamically stable  [CF]  1945 [facility] informed us that there IS  no code PE.  Patient is hemodynamically stable and appropriate for treatment at [facility].  Heparin pending.  Discussed in person with hospitalist who will admit.  [CF]    Clinical Course User Index [CF] Hinda Kehr, MD    ____________________________________________  FINAL CLINICAL IMPRESSION(S) / ED DIAGNOSES  Final diagnoses:  Hypokalemia  Other acute pulmonary embolism with acute cor pulmonale (HCC)     MEDICATIONS GIVEN DURING THIS VISIT:  Medications  magnesium sulfate IVPB 2 g 50 mL (2 g Intravenous New Bag/Given 03/12/16 1912)  potassium chloride 30 mEq in sodium chloride 0.9 % 265 mL (KCL MULTIRUN) IVPB (not administered)  potassium chloride SA (K-DUR,KLOR-CON) CR tablet 40 mEq (40 mEq Oral Given 03/12/16 1912)  iopamidol (ISOVUE-370) 76 % injection 75 mL (75 mLs Intravenous Contrast Given 03/12/16 1856)     NEW OUTPATIENT MEDICATIONS STARTED DURING THIS VISIT:  New Prescriptions   No medications on file    Modified Medications   No medications on file    Discontinued Medications   No medications on file     Note:  This document was prepared using Dragon voice recognition software and may include unintentional dictation errors.    Hinda Kehr, MD 03/12/16 [phone]

## 2016-03-12 NOTE — ED Notes (Signed)
Potassium 2.3, Dr. Karma Greaser notified

## 2016-03-12 NOTE — ED Notes (Signed)
ED Provider at bedside. 

## 2016-03-12 NOTE — ED Triage Notes (Signed)
Pt reports chest pain when she takes a deep breath. Pt recently started on blood thinners for blood clot in her right leg. PCP sent over for further eval.

## 2016-03-12 NOTE — ED Notes (Signed)
Patient transported to CT 

## 2016-03-13 ENCOUNTER — Inpatient Hospital Stay
Admit: 2016-03-13 | Discharge: 2016-03-13 | Disposition: A | Discharge status: Home / Self Care | Payer: BC Managed Care – PPO | Attending: Internal Medicine | Admitting: Internal Medicine

## 2016-03-13 DIAGNOSIS — I82409 Acute embolism and thrombosis of unspecified deep veins of unspecified lower extremity: Secondary | ICD-10-CM

## 2016-03-13 DIAGNOSIS — R079 Chest pain, unspecified: Secondary | ICD-10-CM

## 2016-03-13 DIAGNOSIS — I2699 Other pulmonary embolism without acute cor pulmonale: Secondary | ICD-10-CM

## 2016-03-13 LAB — BASIC METABOLIC PANEL
ANION GAP: 6 (ref 5–15)
Anion gap: 7 (ref 5–15)
BUN: 10 mg/dL (ref 6–20)
BUN: 9 mg/dL (ref 6–20)
CALCIUM: 8.4 mg/dL — AB (ref 8.9–10.3)
CHLORIDE: 105 mmol/L (ref 101–111)
CO2: 25 mmol/L (ref 22–32)
CO2: 24 mmol/L (ref 22–32)
CREATININE: 0.69 mg/dL (ref 0.44–1.00)
Calcium: 8.5 mg/dL — ABNORMAL LOW (ref 8.9–10.3)
Chloride: 108 mmol/L (ref 101–111)
Creatinine, Ser: 0.82 mg/dL (ref 0.44–1.00)
GFR calc Af Amer: >60 mL/min (ref >60)
GFR calc Af Amer: >60 mL/min (ref >60)
GLUCOSE: 129 mg/dL — AB (ref 65–99)
Glucose, Bld: 151 mg/dL — ABNORMAL HIGH (ref 65–99)
POTASSIUM: 2.6 mmol/L — AB (ref 3.5–5.1)
Potassium: 3.5 mmol/L (ref 3.5–5.1)
Sodium: 137 mmol/L (ref 135–145)
Sodium: 138 mmol/L (ref 135–145)

## 2016-03-13 LAB — HEPARIN LEVEL (UNFRACTIONATED)
HEPARIN UNFRACTIONATED: 1.13 [IU]/mL — AB (ref 0.30–0.70)
HEPARIN UNFRACTIONATED: 0.57 [IU]/mL (ref 0.30–0.70)
Heparin Unfractionated: 0.22 IU/mL — ABNORMAL LOW (ref 0.30–0.70)

## 2016-03-13 LAB — PROTIME-INR
INR: 1.06
Prothrombin Time: 13.8 seconds (ref 11.4–15.2)

## 2016-03-13 LAB — CBC
HEMATOCRIT: 27.3 % — AB (ref 35.0–47.0)
Hemoglobin: 8.6 g/dL — ABNORMAL LOW (ref 12.0–16.0)
MCH: 20.8 pg — ABNORMAL LOW (ref 26.0–34.0)
MCHC: 31.5 g/dL — ABNORMAL LOW (ref 32.0–36.0)
MCV: 66.2 fL — AB (ref 80.0–100.0)
PLATELETS: 335 10*3/uL (ref 150–440)
RBC: 4.12 MIL/uL (ref 3.80–5.20)
RDW: 20.7 % — ABNORMAL HIGH (ref 11.5–14.5)
WBC: 8.8 10*3/uL (ref 3.6–11.0)

## 2016-03-13 LAB — APTT
APTT: 53 s — AB (ref 24–36)
APTT: 51 s — AB (ref 24–36)
aPTT: 49 seconds — ABNORMAL HIGH (ref 24–36)

## 2016-03-13 MED ORDER — HYDROCHLOROTHIAZIDE 25 MG PO TABS
25.0000 mg | ORAL_TABLET | Freq: Every day | ORAL | Status: DC
  Administered 2016-03-14 – 2016-03-17 (×4): 25 mg via ORAL
  Filled 2016-03-13 (×5): qty 1

## 2016-03-13 MED ORDER — WARFARIN SODIUM 5 MG PO TABS: 10.0000 mg | ORAL_TABLET | Freq: Once | ORAL | Status: DC

## 2016-03-13 MED ORDER — WARFARIN SODIUM 5 MG PO TABS: 5.0000 mg | ORAL_TABLET | Freq: Once | ORAL | Status: DC

## 2016-03-13 MED ORDER — MAGNESIUM SULFATE 2 GM/50ML IV SOLN
2.0000 g | Freq: Once | INTRAVENOUS | Status: AC
  Administered 2016-03-13: 2 g via INTRAVENOUS
  Filled 2016-03-13: qty 50

## 2016-03-13 MED ORDER — WARFARIN - PHARMACIST DOSING INPATIENT
Freq: Every day | Status: DC
  Administered 2016-03-13 – 2016-03-16 (×3)

## 2016-03-13 MED ORDER — ZOLPIDEM TARTRATE 5 MG PO TABS
5.0000 mg | ORAL_TABLET | Freq: Every evening | ORAL | Status: DC | PRN
  Administered 2016-03-13: 5 mg via ORAL
  Filled 2016-03-13: qty 1

## 2016-03-13 MED ORDER — POTASSIUM CHLORIDE CRYS ER 20 MEQ PO TBCR
40.0000 meq | EXTENDED_RELEASE_TABLET | Freq: Once | ORAL | Status: AC
  Administered 2016-03-13: 40 meq via ORAL
  Filled 2016-03-13: qty 2

## 2016-03-13 MED ORDER — POTASSIUM CHLORIDE CRYS ER 20 MEQ PO TBCR
40.0000 meq | EXTENDED_RELEASE_TABLET | ORAL | Status: AC
Start: 2016-03-13 — End: 2016-03-13
  Administered 2016-03-13 (×2): 40 meq via ORAL
  Filled 2016-03-13 (×2): qty 2

## 2016-03-13 MED ORDER — SODIUM CHLORIDE 0.9 % IV SOLN
30.0000 meq | Freq: Once | INTRAVENOUS | Status: AC
  Administered 2016-03-13: 30 meq via INTRAVENOUS
  Filled 2016-03-13: qty 15

## 2016-03-13 MED ORDER — WARFARIN SODIUM 5 MG PO TABS
10.0000 mg | ORAL_TABLET | Freq: Once | ORAL | Status: AC
  Administered 2016-03-13: 10 mg via ORAL
  Filled 2016-03-13: qty 2

## 2016-03-13 NOTE — Progress Notes (Signed)
ANTICOAGULATION CONSULT NOTE - Initial Consult  Pharmacy Consult for heparin drip Indication: pulmonary embolus  Allergies  Allergen Reactions  . Lisinopril Cough    Patient Measurements: Height: 5\' 9"  (175.3 cm) Weight: (!) 369 lb 3.2 oz (167.5 kg) IBW/kg (Calculated) : 66.2 Heparin Dosing Weight: 106 kg  Vital Signs: Temp: 98.3 F (36.8 C) (12/01 0416) Temp Source: Oral (11/30 2246) BP: 115/47 (12/01 0416) Pulse Rate: 89 (12/01 0416)  Labs:  Recent Labs  03/12/16 1715 03/13/16 0249  HGB 9.6* 8.6*  HCT 29.8* 27.3*  PLT 347 335  APTT 60* 53*  LABPROT 21.6*  --   INR 1.85  --   HEPARINUNFRC >3.60* 1.13*  CREATININE 0.77 0.82  TROPONINI <0.03  --     Estimated Creatinine Clearance: 170.5 mL/min (by C-G formula based on SCr of 0.82 mg/dL).   Medical History: Past Medical History:  Diagnosis Date  . Abnormal vaginal bleeding   . Anxiety   . Depression   . DVT (deep venous thrombosis) ([place])   . Fibroid   . Hypertension   . Obesity   . Sinus congestion     Medications:  Prescriptions Prior to Admission  Medication Sig Dispense Refill Last Dose  . acetaZOLAMIDE (DIAMOX) 250 MG tablet Take 500 mg by mouth 2 (two) times daily.   03/12/2016 at 0830  . amLODipine (NORVASC) 5 MG tablet Take 5 mg by mouth daily.   03/12/2016 at 0830  . clonazePAM (KLONOPIN) 0.5 MG tablet Take 0.5 mg by mouth 2 (two) times daily as needed for anxiety.   prn at prn  . ferrous sulfate 325 (65 FE) MG tablet Take 325 mg by mouth 3 (three) times daily with meals.   03/12/2016 at 1430  . gabapentin (NEURONTIN) 300 MG capsule Take 300 mg by mouth 3 (three) times daily.   03/12/2016 at 1430  . hydrochlorothiazide (HYDRODIURIL) 25 MG tablet Take 25 mg by mouth daily.    03/12/2016 at 0830  . HYDROcodone-acetaminophen (NORCO/VICODIN) 5-325 MG tablet Take 1-2 tablets by mouth every 4 (four) hours as needed for moderate pain.   Past Week at Unknown time  . metFORMIN (GLUCOPHAGE-XR) 500 MG 24  hr tablet Take 1,000 mg by mouth daily with breakfast.   03/12/2016 at 0830  . omeprazole (PRILOSEC) 20 MG capsule Take 20 mg by mouth daily.   03/12/2016 at 1200  . Rivaroxaban 15 & 20 MG TBPK Take as directed on package: Start with one 15mg  tablet by mouth twice a day with food. On Day 22, switch to one 20mg  tablet once a day with food. (Patient taking differently: Take 15-20 mg by mouth 2 (two) times daily. Take as directed on package: Start with one 15mg  tablet by mouth twice a day with food. On Day 22, switch to one 20mg  tablet once a day with food.) 51 each 0 03/12/2016 at 0830   Scheduled:  . acetaZOLAMIDE  500 mg Oral BID  . amLODipine  5 mg Oral Daily  . ferrous sulfate  325 mg Oral TID WC  . gabapentin  300 mg Oral TID  . pantoprazole  40 mg Oral Daily  . potassium chloride  40 mEq Oral Q4H  . sodium chloride flush  3 mL Intravenous Q12H   Infusions:  . heparin 1,700 Units/hr (03/12/16 2033)    Assessment: Pharmacy consulted to dose and monitor heparin drip in this 30 year old female for a newly diagnosed PE. Patient was recently diagnosed with a DVT and prescribed rivaroxaban  15 mg PO BID x 21 days followed by 20 mg once daily.  Today is day #17 of rivaroxaban BID treatment dose. Patient reports compliance with last dose taken at 0830 this morning. She presents with SOB and is diagnosed with a new PE this evening. Per MD, this is a rivaroxaban treatment failure.  Baseline labs ordered in ED.  Goal of Therapy:  Goal APTT 66 - 102 s Heparin level 0.3-0.7 units/ml Monitor platelets by anticoagulation protocol: Yes   Plan:  Give 3000 units bolus x 1 (will give half-bolus as patient took rivaroxaban this morning) Start heparin infusion at 1700 units/hr Check anti-Xa level in 6 hours and daily while on heparin Continue to monitor H&H and platelets  Will need to monitor APTT if baseline HL is elevated  12/1 03:00 aPTT 53, heparin level 1.13. Increase rate to 1800 units/hr and  recheck in 6 hours.  Tearra Ouk S, PharmD, BCPS Clinical Pharmacist 03/13/2016,4:18 AM

## 2016-03-13 NOTE — Care Management (Signed)
Patient presents with pulmonary embolus after being treated with Xarelto for a DVT.  Vascular consult pending. Is not requiring requiring supplemental 02

## 2016-03-13 NOTE — Progress Notes (Addendum)
ANTICOAGULATION CONSULT NOTE - Initial Consult  Pharmacy Consult for warfarin  Indication: Newly diagnosed PE   Allergies  Allergen Reactions  . Lisinopril Cough    Patient Measurements: Height: 5\' 9"  (175.3 cm) Weight: (!) 369 lb 3.2 oz (167.5 kg) IBW/kg (Calculated) : 66.2 Heparin Dosing Weight:   Vital Signs: Temp: 97.7 F (36.5 C) (12/01 1045) Temp Source: Oral (12/01 1045) BP: 126/65 (12/01 1045) Pulse Rate: 83 (12/01 1045)  Labs:  Recent Labs  03/12/16 1715 03/13/16 0249 03/13/16 0956  HGB 9.6* 8.6*  --   HCT 29.8* 27.3*  --   PLT 347 335  --   APTT 60* 53* 51*  LABPROT 21.6*  --   --   INR 1.85  --   --   HEPARINUNFRC >3.60* 1.13* 0.57  CREATININE 0.77 0.82  --   TROPONINI <0.03  --   --     Estimated Creatinine Clearance: 170.5 mL/min (by C-G formula based on SCr of 0.82 mg/dL).   Medical History: Past Medical History:  Diagnosis Date  . Abnormal vaginal bleeding   . Anxiety   . Depression   . DVT (deep venous thrombosis) ([place])   . Fibroid   . Hypertension   . Obesity   . Sinus congestion     Medications:  Prescriptions Prior to Admission  Medication Sig Dispense Refill Last Dose  . acetaZOLAMIDE (DIAMOX) 250 MG tablet Take 500 mg by mouth 2 (two) times daily.   03/12/2016 at 0830  . amLODipine (NORVASC) 5 MG tablet Take 5 mg by mouth daily.   03/12/2016 at 0830  . clonazePAM (KLONOPIN) 0.5 MG tablet Take 0.5 mg by mouth 2 (two) times daily as needed for anxiety.   prn at prn  . ferrous sulfate 325 (65 FE) MG tablet Take 325 mg by mouth 3 (three) times daily with meals.   03/12/2016 at 1430  . gabapentin (NEURONTIN) 300 MG capsule Take 300 mg by mouth 3 (three) times daily.   03/12/2016 at 1430  . hydrochlorothiazide (HYDRODIURIL) 25 MG tablet Take 25 mg by mouth daily.    03/12/2016 at 0830  . HYDROcodone-acetaminophen (NORCO/VICODIN) 5-325 MG tablet Take 1-2 tablets by mouth every 4 (four) hours as needed for moderate pain.   Past Week at  Unknown time  . metFORMIN (GLUCOPHAGE-XR) 500 MG 24 hr tablet Take 1,000 mg by mouth daily with breakfast.   03/12/2016 at 0830  . omeprazole (PRILOSEC) 20 MG capsule Take 20 mg by mouth daily.   03/12/2016 at 1200  . Rivaroxaban 15 & 20 MG TBPK Take as directed on package: Start with one 15mg  tablet by mouth twice a day with food. On Day 22, switch to one 20mg  tablet once a day with food. (Patient taking differently: Take 15-20 mg by mouth 2 (two) times daily. Take as directed on package: Start with one 15mg  tablet by mouth twice a day with food. On Day 22, switch to one 20mg  tablet once a day with food.) 51 each 0 03/12/2016 at 0830   Scheduled:  . acetaZOLAMIDE  500 mg Oral BID  . amLODipine  5 mg Oral Daily  . ferrous sulfate  325 mg Oral TID WC  . gabapentin  300 mg Oral TID  . pantoprazole  40 mg Oral Daily  . potassium chloride (KCL MULTIRUN) 30 mEq in 265 mL IVPB  30 mEq Intravenous Once  . sodium chloride flush  3 mL Intravenous Q12H  . warfarin  10 mg Oral ONCE-1800  .  Warfarin - Pharmacist Dosing Inpatient   Does not apply q1800    Assessment: Pharmacy consulted to dose and monitor warfarin therapy in this 30 year old female who was previously on xarelto therapy. Per patient, she was complaint; however still developed a PE. Patient is currently on heparin gtt and being transitoned to warfarin therapy.             INR             Dose  11/30  1.85             Goal of Therapy:  INR 2-3 Monitor platelets by anticoagulation protocol: Yes   Plan:  Will give warfarin 10 mg PO x 1 dose today. Will recheck PT/INR with am labs.    Saran Laviolette D 03/13/2016,10:52 AM

## 2016-03-13 NOTE — Progress Notes (Signed)
Patient ID: Heather Cherry, female   DOB: 28-Dec-1985, 30 y.o.   MRN: [id number]  [facility] PROGRESS NOTE  Heather Cherry [id number] DOB: 04-14-1985 DOA: 03/12/2016 PCP: Suzanna Obey, MD  HPI/Subjective: Patient came in with shortness of breath, chest and back pain. She states that she's been taking her Xarelto since 02/25/2016. She was diagnosed with a DVT on 02/24/2016. She ended up getting a blood transfusion a couple weeks ago secondary to heavy menses and anemia.  Objective: Vitals:   03/13/16 0416 03/13/16 1045  BP: (!) 115/47 126/65  Pulse: 89 83  Resp: 18 14  Temp: 98.3 F (36.8 C) 97.7 F (36.5 C)    Filed Weights   03/12/16 1704 03/12/16 2246  Weight: (!) 162.4 kg (358 lb) (!) 167.5 kg (369 lb 3.2 oz)    ROS: Review of Systems  Constitutional: Negative for chills and fever.  Eyes: Negative for blurred vision.  Respiratory: Positive for shortness of breath. Negative for cough.   Cardiovascular: Positive for chest pain.  Gastrointestinal: Negative for abdominal pain, constipation, diarrhea, nausea and vomiting.  Genitourinary: Negative for dysuria.  Musculoskeletal: Positive for back pain. Negative for joint pain.  Neurological: Negative for dizziness and headaches.   Exam: Physical Exam  Constitutional: She is oriented to person, place, and time.  HENT:  Nose: No mucosal edema.  Mouth/Throat: No oropharyngeal exudate or posterior oropharyngeal edema.  Eyes: Conjunctivae, EOM and lids are normal. Pupils are equal, round, and reactive to light.  Neck: No JVD present. Carotid bruit is not present. No edema present. No thyroid mass and no thyromegaly present.  Cardiovascular: S1 normal and S2 normal.  Exam reveals no gallop.   No murmur heard. Pulses:      Dorsalis pedis pulses are 2+ on the right side, and 2+ on the left side.  Respiratory: No respiratory distress. She has no wheezes. She has no rhonchi. She has no rales.  GI: Soft. Bowel sounds are  normal. There is no tenderness.  Musculoskeletal:       Right ankle: She exhibits swelling.       Left ankle: She exhibits swelling.  Lymphadenopathy:    She has no cervical adenopathy.  Neurological: She is alert and oriented to person, place, and time. No cranial nerve deficit.  Skin: Skin is warm. No rash noted. Nails show no clubbing.  Psychiatric: She has a normal mood and affect.      Data Reviewed: Basic Metabolic Panel:  Recent Labs Lab 03/12/16 1715 03/13/16 0249 03/13/16 1418  NA 137 137 138  K 2.3* 2.6* 3.5  CL 102 105 108  CO2 26 25 24   GLUCOSE 123* 151* 129*  BUN 10 10 9   CREATININE 0.77 0.82 0.69  CALCIUM 9.1 8.5* 8.4*  MG 1.8  --   --    Liver Function Tests:  Recent Labs Lab 03/12/16 1715  AST 17  ALT 12*  ALKPHOS 66  BILITOT 0.5  PROT 7.5  ALBUMIN 3.4*   CBC:  Recent Labs Lab 03/12/16 1715 03/13/16 0249  WBC 9.3 8.8  HGB 9.6* 8.6*  HCT 29.8* 27.3*  MCV 66.1* 66.2*  PLT 347 335   Cardiac Enzymes:  Recent Labs Lab 03/12/16 1715  TROPONINI <0.03     Studies: Dg Chest 2 View  Result Date: 03/12/2016 CLINICAL DATA:  Chest pain under the right breast EXAM: CHEST  2 VIEW COMPARISON:  None. FINDINGS: The heart size and mediastinal contours are within normal limits. Both lungs are clear. The  visualized skeletal structures are unremarkable. Surgical clips in the right upper quadrant. IMPRESSION: No active cardiopulmonary disease. Electronically Signed   By: Donavan Foil M.D.   On: 03/12/2016 18:34   Ct Angio Chest Pe W Or Wo Contrast  Result Date: 03/12/2016 CLINICAL DATA:  Chest pain with inspiration. On blood thinner for RIGHT lower extremity deep vein thrombosis diagnosed February 24, 2016. EXAM: CT ANGIOGRAPHY CHEST WITH CONTRAST TECHNIQUE: Multidetector CT imaging of the chest was performed using the standard protocol during bolus administration of intravenous contrast. Multiplanar CT image reconstructions and MIPs were obtained to  evaluate the vascular anatomy. CONTRAST:  75 cc Isovue 370 COMPARISON:  Chest radiograph March 12, 2016 at 1829 hours FINDINGS: Large body habitus results in overall noisy image quality. CARDIOVASCULAR: Suboptimal contrast opacification of the pulmonary artery's (164 Hounsfield units, target is 250 Hounsfield units. Isodense pulmonary artery, aorta and pulmonary veins. Main pulmonary artery is not enlarged. Linear filling defect RIGHT lower lobar pulmonary artery, acute RIGHT lower lobe segmental nonocclusive pulmonary emboli. Probable RIGHT upper lobe nonocclusive subsegmental PE. Heart size is normal, mild RIGHT heart strain (RV/ LV = 1). No pericardial effusions. Thoracic aorta is normal course and caliber, unremarkable. MEDIASTINUM/NODES: No lymphadenopathy by CT size criteria. LUNGS/PLEURA: Tracheobronchial tree is patent, no pneumothorax. No pleural effusions, focal consolidations, pulmonary nodules or masses. Minimal lingular atelectasis. UPPER ABDOMEN: Included view of the abdomen status post cholecystectomy. Mild suspected hepatomegaly. MUSCULOSKELETAL: Visualized soft tissues and included osseous structures appear normal. Scattered Schmorl's nodes. Review of the MIP images confirms the above findings. IMPRESSION: Technically limited examination due to bolus timing and habitus. Acute nonocclusive RIGHT segmental to subsegmental pulmonary emboli. CT evidence of right heart strain (RV/LV Ratio = 1) associated with an increased risk of morbidity and mortality. Please activate Code PE by paging [phone]. Critical Value/emergent results were called by telephone at the time of interpretation on 03/12/2016 at 7:15 pm to Dr. Hinda Kehr , who verbally acknowledged these results. Electronically Signed   By: Elon Alas M.D.   On: 03/12/2016 19:16    Scheduled Meds: . acetaZOLAMIDE  500 mg Oral BID  . amLODipine  5 mg Oral Daily  . ferrous sulfate  325 mg Oral TID WC  . gabapentin  300 mg Oral  TID  . pantoprazole  40 mg Oral Daily  . sodium chloride flush  3 mL Intravenous Q12H  . warfarin  10 mg Oral ONCE-1800  . Warfarin - Pharmacist Dosing Inpatient   Does not apply q1800   Continuous Infusions: . heparin 1,900 Units/hr (03/13/16 1351)    Assessment/Plan:  1. Pulmonary embolism with right heart strain. Echocardiogram still pending. Patient on heparin drip and Coumadin will be started. Patient was on Xarelto since 02/25/2016 for DVT of the right lower extremity and states that she has been taking it. I will consider this a failure of treatment with Xarelto. Case discussed with pharmacist and patient. Best option will be Coumadin at this point. Case discussed with vascular surgery to evaluate whether thrombolysis of DVT would be an option and/or IVC filter. 2. Pseudotumor cerebri on Diamox 3. Hypokalemia and hypomagnesemia. Replace IV magnesium and potassium and oral potassium. Recheck electrolytes tomorrow morning. Recheck electrolytes tomorrow morning. 4. Essential hypertension on amlodipine 5. Neuropathy on gabapentin 6. Heavy menstrual bleeding which will be worse being on blood thinner. 7. Iron deficiency anemia. Send ferritin level in the a.m. May benefit from IV iron.  Code Status:     Code Status Orders  Start     Ordered   03/12/16 2245  Full code  Continuous     03/12/16 2244    Code Status History    Date Active Date Inactive Code Status Order ID Comments User Context   This patient has a current code status but no historical code status.      Disposition Plan: Once INR therapeutic and go home  Consultants:  Vascular surgery  Time spent: 30 minutes  [place], [employer]

## 2016-03-13 NOTE — Progress Notes (Signed)
Nutrition Brief Note  Patient identified on the Malnutrition Screening Tool (MST) Report  Wt Readings from Last 15 Encounters:  03/12/16 (!) 369 lb 3.2 oz (167.5 kg)  02/24/16 (!) 375 lb (170.1 kg)  12/17/15 (!) 383 lb (173.7 kg)  02/21/13 (!) 368 lb (166.9 kg)   Spoke with patient and family at bedside. She reports she has lost some weight as a side effect of the medication she was prescribed for her DVT. She reports she is "fine" and does not need nutrition intervention. Reports eating 1-2 meals per day and snacks. She had wanted to lose some weight. Discussed that quick weight loss may result in loss of muscle mass, which is not desirable. Encouraged adequate protein intake with meals and snacks. Patient denies N/V or abdominal pain. Well nourished on NFPE.  Body mass index is 54.52 kg/m. Patient meets criteria for Obesity Class III (Morbid Obesity) based on current BMI.   Current diet order is Heart Healthy/CHO Modified, patient is consuming approximately 100% of meals at this time. Labs and medications reviewed.   No nutrition interventions warranted at this time. If nutrition issues arise, please consult RD.   Willey Blade, MS, RD, LDN Pager: [phone] After Hours Pager: [phone]

## 2016-03-13 NOTE — Progress Notes (Addendum)
ANTICOAGULATION CONSULT NOTE - FOLLOW UP  Pharmacy Consult for heparin drip Indication: pulmonary embolus  Allergies  Allergen Reactions  . Lisinopril Cough    Patient Measurements: Height: 5\' 9"  (175.3 cm) Weight: (!) 369 lb 3.2 oz (167.5 kg) IBW/kg (Calculated) : 66.2 Heparin Dosing Weight: 106 kg  Vital Signs: Temp: 97.7 F (36.5 C) (12/01 1045) Temp Source: Oral (12/01 1045) BP: 126/65 (12/01 1045) Pulse Rate: 83 (12/01 1045)  Labs:  Recent Labs  03/12/16 1715 03/13/16 0249 03/13/16 0956 03/13/16 1418 03/13/16 1824  HGB 9.6* 8.6*  --   --   --   HCT 29.8* 27.3*  --   --   --   PLT 347 335  --   --   --   APTT 60* 53* 51*  --  49*  LABPROT 21.6*  --   --  13.8  --   INR 1.85  --   --  1.06  --   HEPARINUNFRC >3.60* 1.13* 0.57  --  0.22*  CREATININE 0.77 0.82  --  0.69  --   TROPONINI <0.03  --   --   --   --     Estimated Creatinine Clearance: 174.8 mL/min (by C-G formula based on SCr of 0.69 mg/dL).   Medical History: Past Medical History:  Diagnosis Date  . Abnormal vaginal bleeding   . Anxiety   . Depression   . DVT (deep venous thrombosis) ([place])   . Fibroid   . Hypertension   . Obesity   . Sinus congestion     Medications:  Prescriptions Prior to Admission  Medication Sig Dispense Refill Last Dose  . acetaZOLAMIDE (DIAMOX) 250 MG tablet Take 500 mg by mouth 2 (two) times daily.   03/12/2016 at 0830  . amLODipine (NORVASC) 5 MG tablet Take 5 mg by mouth daily.   03/12/2016 at 0830  . clonazePAM (KLONOPIN) 0.5 MG tablet Take 0.5 mg by mouth 2 (two) times daily as needed for anxiety.   prn at prn  . ferrous sulfate 325 (65 FE) MG tablet Take 325 mg by mouth 3 (three) times daily with meals.   03/12/2016 at 1430  . gabapentin (NEURONTIN) 300 MG capsule Take 300 mg by mouth 3 (three) times daily.   03/12/2016 at 1430  . hydrochlorothiazide (HYDRODIURIL) 25 MG tablet Take 25 mg by mouth daily.    03/12/2016 at 0830  . HYDROcodone-acetaminophen  (NORCO/VICODIN) 5-325 MG tablet Take 1-2 tablets by mouth every 4 (four) hours as needed for moderate pain.   Past Week at Unknown time  . metFORMIN (GLUCOPHAGE-XR) 500 MG 24 hr tablet Take 1,000 mg by mouth daily with breakfast.   03/12/2016 at 0830  . omeprazole (PRILOSEC) 20 MG capsule Take 20 mg by mouth daily.   03/12/2016 at 1200  . Rivaroxaban 15 & 20 MG TBPK Take as directed on package: Start with one 15mg  tablet by mouth twice a day with food. On Day 22, switch to one 20mg  tablet once a day with food. (Patient taking differently: Take 15-20 mg by mouth 2 (two) times daily. Take as directed on package: Start with one 15mg  tablet by mouth twice a day with food. On Day 22, switch to one 20mg  tablet once a day with food.) 51 each 0 03/12/2016 at 0830   Scheduled:  . acetaZOLAMIDE  500 mg Oral BID  . amLODipine  5 mg Oral Daily  . ferrous sulfate  325 mg Oral TID WC  . gabapentin  300  mg Oral TID  . pantoprazole  40 mg Oral Daily  . sodium chloride flush  3 mL Intravenous Q12H  . Warfarin - Pharmacist Dosing Inpatient   Does not apply q1800   Infusions:  . heparin 1,900 Units/hr (03/13/16 1351)    Assessment: Pharmacy consulted to dose and monitor heparin drip in this 30 year old female for a newly diagnosed PE. Patient was recently diagnosed with a DVT and prescribed rivaroxaban 15 mg PO BID x 21 days followed by 20 mg once daily.  Today is day #17 of rivaroxaban BID treatment dose. Patient reports compliance with last dose taken at 0830 this morning. She presents with SOB and is diagnosed with a new PE this evening. Per MD, this is a rivaroxaban treatment failure.  11/30 Gave 3000 units bolus x 1 (half-bolus as patient took rivaroxaban that am) Started heparin infusion at 1700 units/hr  12/1 03:00 aPTT 53, heparin level 1.13. Increase rate to 1800 units/hr and recheck in 6 hours.  12/1: aPTT 51; Heparin level 0.51. Will increase heparin gtt rate to 1900 units/hr and will recheck  heparin level and APTT @ 17:00.   Goal of Therapy:  Goal APTT 66 - 102 s Heparin level 0.3-0.7 units/ml Monitor platelets by anticoagulation protocol: Yes   Plan:  Check anti-Xa level in 6 hours and daily while on heparin Continue to monitor H&H and platelets Need to monitor APTT due to elevated baseline antiXa level  12/1 1830 APTT = 49. Per RN, no disruptions in therapy and rate was running as ordered. Will increase rate to 2000 units/hr and recheck 6 hours from rate change (12/2 0130)  12/2 02:30 aPTT 58, heparin level 0.16. Increase to 2100 units/hr and recheck in 6 hours.   Darrow Bussing, PharmD Pharmacy Resident 03/13/2016 7:30 PM

## 2016-03-13 NOTE — Progress Notes (Signed)
*  PRELIMINARY RESULTS* Echocardiogram 2D Echocardiogram has been performed.  Heather Cherry 03/13/2016, 3:38 PM

## 2016-03-13 NOTE — Progress Notes (Signed)
ANTICOAGULATION CONSULT NOTE - FOLLOW UP  Pharmacy Consult for heparin drip Indication: pulmonary embolus  Allergies  Allergen Reactions  . Lisinopril Cough    Patient Measurements: Height: 5\' 9"  (175.3 cm) Weight: (!) 369 lb 3.2 oz (167.5 kg) IBW/kg (Calculated) : 66.2 Heparin Dosing Weight: 106 kg  Vital Signs: Temp: 98.3 F (36.8 C) (12/01 0416) Temp Source: Oral (11/30 2246) BP: 115/47 (12/01 0416) Pulse Rate: 89 (12/01 0416)  Labs:  Recent Labs  03/12/16 1715 03/13/16 0249 03/13/16 0956  HGB 9.6* 8.6*  --   HCT 29.8* 27.3*  --   PLT 347 335  --   APTT 60* 53* 51*  LABPROT 21.6*  --   --   INR 1.85  --   --   HEPARINUNFRC >3.60* 1.13* 0.57  CREATININE 0.77 0.82  --   TROPONINI <0.03  --   --     Estimated Creatinine Clearance: 170.5 mL/min (by C-G formula based on SCr of 0.82 mg/dL).   Medical History: Past Medical History:  Diagnosis Date  . Abnormal vaginal bleeding   . Anxiety   . Depression   . DVT (deep venous thrombosis) ([place])   . Fibroid   . Hypertension   . Obesity   . Sinus congestion     Medications:  Prescriptions Prior to Admission  Medication Sig Dispense Refill Last Dose  . acetaZOLAMIDE (DIAMOX) 250 MG tablet Take 500 mg by mouth 2 (two) times daily.   03/12/2016 at 0830  . amLODipine (NORVASC) 5 MG tablet Take 5 mg by mouth daily.   03/12/2016 at 0830  . clonazePAM (KLONOPIN) 0.5 MG tablet Take 0.5 mg by mouth 2 (two) times daily as needed for anxiety.   prn at prn  . ferrous sulfate 325 (65 FE) MG tablet Take 325 mg by mouth 3 (three) times daily with meals.   03/12/2016 at 1430  . gabapentin (NEURONTIN) 300 MG capsule Take 300 mg by mouth 3 (three) times daily.   03/12/2016 at 1430  . hydrochlorothiazide (HYDRODIURIL) 25 MG tablet Take 25 mg by mouth daily.    03/12/2016 at 0830  . HYDROcodone-acetaminophen (NORCO/VICODIN) 5-325 MG tablet Take 1-2 tablets by mouth every 4 (four) hours as needed for moderate pain.   Past Week at  Unknown time  . metFORMIN (GLUCOPHAGE-XR) 500 MG 24 hr tablet Take 1,000 mg by mouth daily with breakfast.   03/12/2016 at 0830  . omeprazole (PRILOSEC) 20 MG capsule Take 20 mg by mouth daily.   03/12/2016 at 1200  . Rivaroxaban 15 & 20 MG TBPK Take as directed on package: Start with one 15mg  tablet by mouth twice a day with food. On Day 22, switch to one 20mg  tablet once a day with food. (Patient taking differently: Take 15-20 mg by mouth 2 (two) times daily. Take as directed on package: Start with one 15mg  tablet by mouth twice a day with food. On Day 22, switch to one 20mg  tablet once a day with food.) 51 each 0 03/12/2016 at 0830   Scheduled:  . acetaZOLAMIDE  500 mg Oral BID  . amLODipine  5 mg Oral Daily  . ferrous sulfate  325 mg Oral TID WC  . gabapentin  300 mg Oral TID  . pantoprazole  40 mg Oral Daily  . potassium chloride (KCL MULTIRUN) 30 mEq in 265 mL IVPB  30 mEq Intravenous Once  . sodium chloride flush  3 mL Intravenous Q12H   Infusions:  . heparin 1,800 Units/hr (03/13/16 [id number])  Assessment: Pharmacy consulted to dose and monitor heparin drip in this 30 year old female for a newly diagnosed PE. Patient was recently diagnosed with a DVT and prescribed rivaroxaban 15 mg PO BID x 21 days followed by 20 mg once daily.  Today is day #17 of rivaroxaban BID treatment dose. Patient reports compliance with last dose taken at 0830 this morning. She presents with SOB and is diagnosed with a new PE this evening. Per MD, this is a rivaroxaban treatment failure.  Baseline labs ordered in ED.  Goal of Therapy:  Goal APTT 66 - 102 s Heparin level 0.3-0.7 units/ml Monitor platelets by anticoagulation protocol: Yes   Plan:  Give 3000 units bolus x 1 (will give half-bolus as patient took rivaroxaban this morning) Start heparin infusion at 1700 units/hr Check anti-Xa level in 6 hours and daily while on heparin Continue to monitor H&H and platelets  Will need to monitor APTT if  baseline HL is elevated  12/1 03:00 aPTT 53, heparin level 1.13. Increase rate to 1800 units/hr and recheck in 6 hours.  12/1: aPTT 51; Heparin level 0.51. Will increase heparin gtt rate to 1900 units/hr and will recheck heparin level and APTT @ 17:00.   Larene Beach, PharmD, BCPS Clinical Pharmacist 03/13/2016,10:46 AM

## 2016-03-13 NOTE — Consult Note (Signed)
[facility] Vascular Consult Note  MRN : [id number]  Heather Cherry is a 30 y.o. (08-Apr-1986) female who presents with chief complaint of  Chief Complaint  Patient presents with  . Chest Pain   History of Present Illness:  The patient is a 30 year old female admitted with a DVT and PE while taking Xarelto. Patient states she has been taking her Xarelto as directed - daily, wearing compression stockings and elevating her lower extremity. Original DVT diagnosed about two weeks ago. Patient endorses a history of chest pain. States it started acutely, mild to moderate in intensity, felt sharp, and was worse with deep breaths. She never felt these symptoms before which prompted her to seek medical attention at [facility]. She denies fever/chills, nausea and vomiting. Her symptoms are improved today.  Patient with acute nonocclusive right segmental to subsegmental pulmonary emboli. Patient started on heparin gtt.  Vascular surgery consulted by Dr. Leslye Peer for recommendations regarding PE and DVT.  Current Facility-Administered Medications  Medication Dose Route Frequency Provider Last Rate Last Dose  . acetaminophen (TYLENOL) tablet 650 mg  650 mg Oral Q6H PRN Lance Coon, MD       Or  . acetaminophen (TYLENOL) suppository 650 mg  650 mg Rectal Q6H PRN Lance Coon, MD      . acetaZOLAMIDE (DIAMOX) tablet 500 mg  500 mg Oral BID Lance Coon, MD   500 mg at 03/13/16 0910  . amLODipine (NORVASC) tablet 5 mg  5 mg Oral Daily Lance Coon, MD   5 mg at 03/13/16 0910  . clonazePAM (KLONOPIN) tablet 0.5 mg  0.5 mg Oral BID PRN Lance Coon, MD      . ferrous sulfate tablet 325 mg  325 mg Oral TID WC Lance Coon, MD   325 mg at 03/13/16 1223  . gabapentin (NEURONTIN) capsule 300 mg  300 mg Oral TID Lance Coon, MD   300 mg at 03/13/16 1513  . heparin ADULT infusion 100 units/mL (25000 units/247mL sodium chloride 0.45%)  1,900 Units/hr Intravenous Continuous Lance Coon, MD 19  mL/hr at 03/13/16 1351 1,900 Units/hr at 03/13/16 1351  . HYDROcodone-acetaminophen (NORCO/VICODIN) 5-325 MG per tablet 1-2 tablet  1-2 tablet Oral Q4H PRN Lance Coon, MD   2 tablet at 03/13/16 0123  . ondansetron (ZOFRAN) tablet 4 mg  4 mg Oral Q6H PRN Lance Coon, MD       Or  . ondansetron [facility]) injection 4 mg  4 mg Intravenous Q6H PRN Lance Coon, MD      . pantoprazole (PROTONIX) EC tablet 40 mg  40 mg Oral Daily Lance Coon, MD   40 mg at 03/13/16 0910  . sodium chloride flush (NS) 0.9 % injection 3 mL  3 mL Intravenous Q12H Lance Coon, MD   3 mL at 03/13/16 0911  . warfarin (COUMADIN) tablet 10 mg  10 mg Oral ONCE-1800 Loletha Grayer, MD      . Warfarin - Pharmacist Dosing Inpatient   Does not apply [id number] Loletha Grayer, MD       Past Medical History:  Diagnosis Date  . Abnormal vaginal bleeding   . Anxiety   . Depression   . DVT (deep venous thrombosis) ([place])   . Fibroid   . Hypertension   . Obesity   . Sinus congestion    Past Surgical History:  Procedure Laterality Date  . CHOLECYSTECTOMY    . WISDOM TOOTH EXTRACTION     Social History Social History  Substance Use Topics  .  Smoking status: Never Smoker  . Smokeless tobacco: Never Used  . Alcohol use Yes     Comment: occas. social   Family History Family History  Problem Relation Age of Onset  . Cancer Paternal Grandfather   Denies history of clotting / bleeding disorders, PAD or renal disease.  Allergies  Allergen Reactions  . Lisinopril Cough   REVIEW OF SYSTEMS (Negative unless checked)  Constitutional: [] Weight loss  [] Fever  [] Chills Cardiac: [x] Chest pain   [x] Chest pressure   [x] Palpitations   [] Shortness of breath when laying flat   [] Shortness of breath at rest   [] Shortness of breath with exertion. Vascular:  [] Pain in legs with walking   [] Pain in legs at rest   [] Pain in legs when laying flat   [] Claudication   [] Pain in feet when walking  [] Pain in feet at rest  [] Pain in feet when  laying flat   [x] History of DVT   [] Phlebitis   [x] Swelling in legs   [] Varicose veins   [] Non-healing ulcers Pulmonary:   [] Uses home oxygen   [] Productive cough   [] Hemoptysis   [] Wheeze  [] COPD   [] Asthma Neurologic:  [] Dizziness  [] Blackouts   [] Seizures   [] History of stroke   [] History of TIA  [] Aphasia   [] Temporary blindness   [] Dysphagia   [] Weakness or numbness in arms   [] Weakness or numbness in legs Musculoskeletal:  [] Arthritis   [] Joint swelling   [] Joint pain   [] Low back pain Hematologic:  [] Easy bruising  [] Easy bleeding   [] Hypercoagulable state   [] Anemic  [] Hepatitis Gastrointestinal:  [] Blood in stool   [] Vomiting blood  [] Gastroesophageal reflux/heartburn   [] Difficulty swallowing. Genitourinary:  [] Chronic kidney disease   [] Difficult urination  [] Frequent urination  [] Burning with urination   [] Blood in urine Skin:  [] Rashes   [] Ulcers   [] Wounds Psychological:  [] History of anxiety   []  History of major depression.  Physical Examination  Vitals:   03/12/16 2130 03/12/16 2246 03/13/16 0416 03/13/16 1045  BP: 137/65 129/86 (!) 115/47 126/65  Pulse: 96 (!) 102 89 83  Resp: 19 18 18 14   Temp:  98.2 F (36.8 C) 98.3 F (36.8 C) 97.7 F (36.5 C)  TempSrc:  Oral  Oral  SpO2: 100% 99% 97% 99%  Weight:  (!) 369 lb 3.2 oz (167.5 kg)    Height:  5\' 9"  (1.753 m)     Body mass index is 54.52 kg/m. Gen:  Morbidly Obese, WD/WN, NAD Head: [place]/AT, No temporalis wasting. Prominent temp pulse not noted. Ear/Nose/Throat: Hearing grossly intact, nares w/o erythema or drainage, oropharynx w/o Erythema/Exudate Eyes: Sclera non-icteric, conjunctiva clear Neck: Trachea midline.  No JVD.  Pulmonary:  Good air movement, respirations not labored, equal bilaterally.  Cardiac: RRR, normal S1, S2. Vascular:  Vessel Right Left  Radial Palpable Palpable  Ulnar Palpable Palpable  Brachial Palpable Palpable  Carotid Palpable, without bruit Palpable, without bruit  Aorta Not palpable N/A   Femoral Palpable Palpable  Popliteal  Hard to appreciate due to body habitus, however extremity warm and pink Hard to appreciate due to body habitus, however extremity warm and pink  PT Hard to appreciate due to body habitus, however extremity warm and pink Hard to appreciate due to body habitus, however extremity warm and pink  DP Hard to appreciate due to body habitus, however extremity warm and pink Hard to appreciate due to body habitus, however extremity warm and pink   Gastrointestinal: soft, non-tender/non-distended. No guarding/reflex.  Musculoskeletal: M/S 5/5  throughout.  Extremities without ischemic changes.  No deformity or atrophy. Moderate bilateral edema. Neurologic: Sensation grossly intact in extremities.  Symmetrical.  Speech is fluent. Motor exam as listed above. Psychiatric: Judgment intact, Mood & affect appropriate for pt's clinical situation. Dermatologic: No rashes or ulcers noted.  No cellulitis or open wounds. Lymph : No Cervical, Axillary, or Inguinal lymphadenopathy.  CBC Lab Results  Component Value Date   WBC 8.8 03/13/2016   HGB 8.6 (L) 03/13/2016   HCT 27.3 (L) 03/13/2016   MCV 66.2 (L) 03/13/2016   PLT 335 03/13/2016   BMET    Component Value Date/Time   NA 138 03/13/2016 1418   K 3.5 03/13/2016 1418   CL 108 03/13/2016 1418   CO2 24 03/13/2016 1418   GLUCOSE 129 (H) 03/13/2016 1418   BUN 9 03/13/2016 1418   CREATININE 0.69 03/13/2016 1418   CALCIUM 8.4 (L) 03/13/2016 1418   GFRNONAA >60 03/13/2016 1418   GFRAA >60 03/13/2016 1418   Estimated Creatinine Clearance: 174.8 mL/min (by C-G formula based on SCr of 0.69 mg/dL).  COAG Lab Results  Component Value Date   INR 1.06 03/13/2016   INR 1.85 03/12/2016   Radiology Dg Chest 2 View  Result Date: 03/12/2016 CLINICAL DATA:  Chest pain under the right breast EXAM: CHEST  2 VIEW COMPARISON:  None. FINDINGS: The heart size and mediastinal contours are within normal limits. Both lungs are  clear. The visualized skeletal structures are unremarkable. Surgical clips in the right upper quadrant. IMPRESSION: No active cardiopulmonary disease. Electronically Signed   By: Donavan Foil M.D.   On: 03/12/2016 18:34   Ct Angio Chest Pe W Or Wo Contrast  Result Date: 03/12/2016 CLINICAL DATA:  Chest pain with inspiration. On blood thinner for RIGHT lower extremity deep vein thrombosis diagnosed February 24, 2016. EXAM: CT ANGIOGRAPHY CHEST WITH CONTRAST TECHNIQUE: Multidetector CT imaging of the chest was performed using the standard protocol during bolus administration of intravenous contrast. Multiplanar CT image reconstructions and MIPs were obtained to evaluate the vascular anatomy. CONTRAST:  75 cc Isovue 370 COMPARISON:  Chest radiograph March 12, 2016 at 1829 hours FINDINGS: Large body habitus results in overall noisy image quality. CARDIOVASCULAR: Suboptimal contrast opacification of the pulmonary artery's (164 Hounsfield units, target is 250 Hounsfield units. Isodense pulmonary artery, aorta and pulmonary veins. Main pulmonary artery is not enlarged. Linear filling defect RIGHT lower lobar pulmonary artery, acute RIGHT lower lobe segmental nonocclusive pulmonary emboli. Probable RIGHT upper lobe nonocclusive subsegmental PE. Heart size is normal, mild RIGHT heart strain (RV/ LV = 1). No pericardial effusions. Thoracic aorta is normal course and caliber, unremarkable. MEDIASTINUM/NODES: No lymphadenopathy by CT size criteria. LUNGS/PLEURA: Tracheobronchial tree is patent, no pneumothorax. No pleural effusions, focal consolidations, pulmonary nodules or masses. Minimal lingular atelectasis. UPPER ABDOMEN: Included view of the abdomen status post cholecystectomy. Mild suspected hepatomegaly. MUSCULOSKELETAL: Visualized soft tissues and included osseous structures appear normal. Scattered Schmorl's nodes. Review of the MIP images confirms the above findings. IMPRESSION: Technically limited  examination due to bolus timing and habitus. Acute nonocclusive RIGHT segmental to subsegmental pulmonary emboli. CT evidence of right heart strain (RV/LV Ratio = 1) associated with an increased risk of morbidity and mortality. Please activate Code PE by paging [phone]. Critical Value/emergent results were called by telephone at the time of interpretation on 03/12/2016 at 7:15 pm to Dr. Hinda Kehr , who verbally acknowledged these results. Electronically Signed   By: Elon Alas M.D.   On: 03/12/2016 19:16  US Venous Img Lower Unilateral Right  Result Date: 02/24/2016 CLINICAL DATA:  Acute right calf pain for the past 12 hours. Evaluate for DVT. EXAM: RIGHT LOWER EXTREMITY VENOUS DOPPLER ULTRASOUND TECHNIQUE: Gray-scale sonography with graded compression, as well as color Doppler and duplex ultrasound were performed to evaluate the lower extremity deep venous systems from the level of the common femoral vein and including the common femoral, femoral, profunda femoral, popliteal and calf veins including the posterior tibial, peroneal and gastrocnemius veins when visible. The superficial great saphenous vein was also interrogated. Spectral Doppler was utilized to evaluate flow at rest and with distal augmentation maneuvers in the common femoral, femoral and popliteal veins. COMPARISON:  None. FINDINGS: Examination is degraded due to patient body habitus and poor sonographic window. Contralateral Common Femoral Vein: Respiratory phasicity is normal and symmetric with the symptomatic side. No evidence of thrombus. Normal compressibility. Common Femoral Vein: No evidence of thrombus. Normal compressibility, respiratory phasicity and response to augmentation. Saphenofemoral Junction: No evidence of thrombus. Normal compressibility and flow on color Doppler imaging. Profunda Femoral Vein: No evidence of thrombus. Normal compressibility and flow on color Doppler imaging. Femoral Vein: No evidence of  thrombus. Normal compressibility, respiratory phasicity and response to augmentation. Popliteal Vein: There is a mixed echogenic occlusive thrombus within the distal aspect of the right popliteal vein, distal to the confluence of the lesser saphenous vein (image 31). Calf Veins: There is echogenic occlusive thrombus within the right posterior tibial vein (representative images 34 and 35). The peroneal vein is suboptimally imaged. Superficial Great Saphenous Vein: No evidence of thrombus. Normal compressibility and flow on color Doppler imaging. Venous Reflux:  None. Other Findings:  None. 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 Electronically Signed   By: Sandi Mariscal M.D.   On: 02/24/2016 16:56   Assessment/Plan 30 year old female with acute PE and diagnosis of DVT two weeks ago treated with daily Xarelto 1. Failure of Xarelto. Continue Heparin gtt. Recommend switching anticoagulation. Patient would most likely benefit from Coumadin as INR can be monitored and adjusted accordingly. 2. Venous Lysis: No indication. Patient is stable. 3. Compression and elevation. Patient was strongly recommended to wear 20-30 mmHg compression stockings on a daily basis and elevate her extremities to prevent post phlebitic symptoms.  Discussed with Dr. Mayme Genta, PA-C  03/13/2016 4:59 PM

## 2016-03-14 LAB — CBC
HCT: 28.6 % — ABNORMAL LOW (ref 35.0–47.0)
Hemoglobin: 8.9 g/dL — ABNORMAL LOW (ref 12.0–16.0)
MCH: 21 pg — ABNORMAL LOW (ref 26.0–34.0)
MCHC: 31.1 g/dL — ABNORMAL LOW (ref 32.0–36.0)
MCV: 67.8 fL — AB (ref 80.0–100.0)
PLATELETS: 337 10*3/uL (ref 150–440)
RBC: 4.22 MIL/uL (ref 3.80–5.20)
RDW: 20.9 % — AB (ref 11.5–14.5)
WBC: 8 10*3/uL (ref 3.6–11.0)

## 2016-03-14 LAB — BASIC METABOLIC PANEL
ANION GAP: 5 (ref 5–15)
BUN: 10 mg/dL (ref 6–20)
CALCIUM: 8.4 mg/dL — AB (ref 8.9–10.3)
CO2: 23 mmol/L (ref 22–32)
Chloride: 109 mmol/L (ref 101–111)
Creatinine, Ser: 0.7 mg/dL (ref 0.44–1.00)
Glucose, Bld: 139 mg/dL — ABNORMAL HIGH (ref 65–99)
POTASSIUM: 3.8 mmol/L (ref 3.5–5.1)
Sodium: 137 mmol/L (ref 135–145)

## 2016-03-14 LAB — FERRITIN: FERRITIN: 9 ng/mL — AB (ref 11–307)

## 2016-03-14 LAB — ECHOCARDIOGRAM COMPLETE
HEIGHTINCHES: 69 in
WEIGHTICAEL: 5907.2 [oz_av]

## 2016-03-14 LAB — HEPARIN LEVEL (UNFRACTIONATED): Heparin Unfractionated: 0.16 IU/mL — ABNORMAL LOW (ref 0.30–0.70)

## 2016-03-14 LAB — PROTIME-INR
INR: 1.01
PROTHROMBIN TIME: 13.3 s (ref 11.4–15.2)

## 2016-03-14 LAB — MAGNESIUM: MAGNESIUM: 2.2 mg/dL (ref 1.7–2.4)

## 2016-03-14 LAB — APTT
APTT: 65 s — AB (ref 24–36)
aPTT: 58 seconds — ABNORMAL HIGH (ref 24–36)
aPTT: 52 seconds — ABNORMAL HIGH (ref 24–36)

## 2016-03-14 MED ORDER — WARFARIN SODIUM 5 MG PO TABS
10.0000 mg | ORAL_TABLET | Freq: Once | ORAL | Status: AC
  Administered 2016-03-14: 10 mg via ORAL
  Filled 2016-03-14: qty 2

## 2016-03-14 MED ORDER — ZOLPIDEM TARTRATE 5 MG PO TABS
10.0000 mg | ORAL_TABLET | Freq: Every day | ORAL | Status: DC
  Administered 2016-03-14 – 2016-03-16 (×3): 10 mg via ORAL
  Filled 2016-03-14 (×3): qty 2

## 2016-03-14 MED ORDER — KETOROLAC TROMETHAMINE 30 MG/ML IJ SOLN
30.0000 mg | Freq: Once | INTRAMUSCULAR | Status: AC
  Administered 2016-03-14: 30 mg via INTRAVENOUS
  Filled 2016-03-14: qty 1

## 2016-03-14 MED ORDER — METFORMIN HCL ER 500 MG PO TB24
1000.0000 mg | ORAL_TABLET | Freq: Every day | ORAL | Status: DC
  Administered 2016-03-15 – 2016-03-17 (×3): 1000 mg via ORAL
  Filled 2016-03-14 (×3): qty 2

## 2016-03-14 MED ORDER — HEPARIN (PORCINE) IN NACL 100-0.45 UNIT/ML-% IJ SOLN
2900.0000 [IU]/h | INTRAMUSCULAR | Status: DC
  Administered 2016-03-14: 2200 [IU]/h via INTRAVENOUS
  Administered 2016-03-15 (×2): 2700 [IU]/h via INTRAVENOUS
  Administered 2016-03-15: 2300 [IU]/h via INTRAVENOUS
  Administered 2016-03-16 (×2): 2700 [IU]/h via INTRAVENOUS
  Administered 2016-03-17: 2900 [IU]/h via INTRAVENOUS
  Administered 2016-03-17: 2700 [IU]/h via INTRAVENOUS
  Filled 2016-03-14 (×6): qty 250

## 2016-03-14 MED ORDER — HYDROMORPHONE HCL 1 MG/ML IJ SOLN
1.0000 mg | INTRAMUSCULAR | Status: DC | PRN
  Administered 2016-03-14 – 2016-03-17 (×7): 1 mg via INTRAVENOUS
  Filled 2016-03-14 (×7): qty 1

## 2016-03-14 MED ORDER — KETOROLAC TROMETHAMINE 15 MG/ML IJ SOLN
30.0000 mg | Freq: Four times a day (QID) | INTRAMUSCULAR | Status: AC
  Administered 2016-03-14: 30 mg via INTRAVENOUS
  Filled 2016-03-14 (×2): qty 2

## 2016-03-14 NOTE — Progress Notes (Signed)
Patient refusing to have second IV site.

## 2016-03-14 NOTE — Progress Notes (Addendum)
ANTICOAGULATION CONSULT NOTE - FOLLOW UP  Pharmacy Consult for heparin drip Indication: pulmonary embolus  Allergies  Allergen Reactions  . Lisinopril Cough    Patient Measurements: Height: 5\' 9"  (175.3 cm) Weight: (!) 369 lb 3.2 oz (167.5 kg) IBW/kg (Calculated) : 66.2 Heparin Dosing Weight: 106 kg  Vital Signs: Temp: 98.1 F (36.7 C) (12/02 1148) Temp Source: Oral (12/02 1148) BP: 142/85 (12/02 1148) Pulse Rate: 109 (12/02 1148)  Labs:  Recent Labs  03/12/16 1715 03/13/16 0249 03/13/16 0956 03/13/16 1418 03/13/16 1824 03/14/16 0229 03/14/16 0902 03/14/16 1708  HGB 9.6* 8.6*  --   --   --  8.9*  --   --   HCT 29.8* 27.3*  --   --   --  28.6*  --   --   PLT 347 335  --   --   --  337  --   --   APTT 60* 53* 51*  --  49* 58* 52* 65*  LABPROT 21.6*  --   --  13.8  --  13.3  --   --   INR 1.85  --   --  1.06  --  1.01  --   --   HEPARINUNFRC >3.60* 1.13* 0.57  --  0.22* 0.16*  --   --   CREATININE 0.77 0.82  --  0.69  --  0.70  --   --   TROPONINI <0.03  --   --   --   --   --   --   --     Estimated Creatinine Clearance: 174.8 mL/min (by C-G formula based on SCr of 0.7 mg/dL).   Medical History: Past Medical History:  Diagnosis Date  . Abnormal vaginal bleeding   . Anxiety   . Depression   . DVT (deep venous thrombosis) ([place])   . Fibroid   . Hypertension   . Obesity   . Sinus congestion     Medications:  Prescriptions Prior to Admission  Medication Sig Dispense Refill Last Dose  . acetaZOLAMIDE (DIAMOX) 250 MG tablet Take 500 mg by mouth 2 (two) times daily.   03/12/2016 at 0830  . amLODipine (NORVASC) 5 MG tablet Take 5 mg by mouth daily.   03/12/2016 at 0830  . clonazePAM (KLONOPIN) 0.5 MG tablet Take 0.5 mg by mouth 2 (two) times daily as needed for anxiety.   prn at prn  . ferrous sulfate 325 (65 FE) MG tablet Take 325 mg by mouth 3 (three) times daily with meals.   03/12/2016 at 1430  . gabapentin (NEURONTIN) 300 MG capsule Take 300 mg by mouth 3  (three) times daily.   03/12/2016 at 1430  . hydrochlorothiazide (HYDRODIURIL) 25 MG tablet Take 25 mg by mouth daily.    03/12/2016 at 0830  . HYDROcodone-acetaminophen (NORCO/VICODIN) 5-325 MG tablet Take 1-2 tablets by mouth every 4 (four) hours as needed for moderate pain.   Past Week at Unknown time  . metFORMIN (GLUCOPHAGE-XR) 500 MG 24 hr tablet Take 1,000 mg by mouth daily with breakfast.   03/12/2016 at 0830  . omeprazole (PRILOSEC) 20 MG capsule Take 20 mg by mouth daily.   03/12/2016 at 1200  . Rivaroxaban 15 & 20 MG TBPK Take as directed on package: Start with one 15mg  tablet by mouth twice a day with food. On Day 22, switch to one 20mg  tablet once a day with food. (Patient taking differently: Take 15-20 mg by mouth 2 (two) times daily. Take as directed  on package: Start with one 15mg  tablet by mouth twice a day with food. On Day 22, switch to one 20mg  tablet once a day with food.) 51 each 0 03/12/2016 at 0830   Scheduled:  . acetaZOLAMIDE  500 mg Oral BID  . amLODipine  5 mg Oral Daily  . ferrous sulfate  325 mg Oral TID WC  . gabapentin  300 mg Oral TID  . hydrochlorothiazide  25 mg Oral Daily  . ketorolac  30 mg Intravenous Q6H  . [START ON 03/15/2016] metFORMIN  1,000 mg Oral Q breakfast  . pantoprazole  40 mg Oral Daily  . sodium chloride flush  3 mL Intravenous Q12H  . warfarin  10 mg Oral ONCE-1800  . Warfarin - Pharmacist Dosing Inpatient   Does not apply q1800  . zolpidem  10 mg Oral QHS   Infusions:  . heparin 2,200 Units/hr (03/14/16 1204)    Assessment: Pharmacy consulted to dose and monitor heparin drip in this 30 year old female for a newly diagnosed PE. Patient was recently diagnosed with a DVT and prescribed rivaroxaban 15 mg PO BID x 21 days followed by 20 mg once daily.  Today is day #17 of rivaroxaban BID treatment dose. Patient reports compliance with last dose taken at 0830 this morning. She presents with SOB and is diagnosed with a new PE this evening. Per  MD, this is a rivaroxaban treatment failure.  11/30 Gave 3000 units bolus x 1 (half-bolus as patient took rivaroxaban that am) Started heparin infusion at 1700 units/hr  12/1 03:00 aPTT 53, heparin level 1.13. Increase rate to 1800 units/hr and recheck in 6 hours.  12/1: aPTT 51; Heparin level 0.51. Will increase heparin gtt rate to 1900 units/hr and will recheck heparin level and APTT @ 17:00.   Goal of Therapy:  Goal APTT 66 - 102 s Heparin level 0.3-0.7 units/ml Monitor platelets by anticoagulation protocol: Yes   Plan:  Check anti-Xa level in 6 hours and daily while on heparin Continue to monitor H&H and platelets Need to monitor APTT due to elevated baseline antiXa level  12/1 1830 APTT = 49. Per RN, no disruptions in therapy and rate was running as ordered. Will increase rate to 2000 units/hr and recheck 6 hours from rate change (12/2 0130)  12/2 02:30 aPTT 58, heparin level 0.16. Increase to 2100 units/hr and recheck in 6 hours.  12/2 11:30 aPTT = 52. Increased drip rate to 2200 units/hr. Recheck in 6 hours at 17:30.    12/2 1730 aPTT= 65. Will increase infusion to 2300 units/hr. Recheck in 6 hours.   12/3 01:00 aPTT 54, heparin level 0.11.  Since heparin level appears to continue to coinicide with aPTT as subtherapeutic, will dose by heparin levels alone. 3200 unit bolus and increase to 2700 units/hr. Recheck heparin level in 6 hours. CBC in AM.  Pernell Dupre, [facility] Clinical Pharmacist 03/14/2016 5:51 PM

## 2016-03-14 NOTE — Progress Notes (Signed)
Patient ID: Heather Cherry, female   DOB: [birth year], 30 y.o.   MRN: [id number]  [facility] PROGRESS NOTE  Heather Cherry [id number] DOB: 05/15/1985 DOA: 03/12/2016 PCP: Suzanna Obey, MD  HPI/Subjective: Currently denies any chest pains states that she has a severe headache could not sleep last night  Objective: Vitals:   03/14/16 0350 03/14/16 1148  BP: (!) 110/53 (!) 142/85  Pulse: 97 (!) 109  Resp: 18 16  Temp: 97.9 F (36.6 C) 98.1 F (36.7 C)    Filed Weights   03/12/16 1704 03/12/16 2246  Weight: (!) 358 lb (162.4 kg) (!) 369 lb 3.2 oz (167.5 kg)    ROS: Review of Systems  Constitutional: Negative for chills and fever.  Eyes: Negative for blurred vision.  Respiratory: Positive for shortness of breath. Negative for cough.   Cardiovascular: Positive for chest pain.  Gastrointestinal: Negative for abdominal pain, constipation, diarrhea, nausea and vomiting.  Genitourinary: Negative for dysuria.  Musculoskeletal: Positive for back pain. Negative for joint pain.  Neurological: Positive for headaches. Negative for dizziness.   Exam: Physical Exam  Constitutional: She is oriented to person, place, and time.  HENT:  Nose: No mucosal edema.  Mouth/Throat: No oropharyngeal exudate or posterior oropharyngeal edema.  Eyes: Conjunctivae, EOM and lids are normal. Pupils are equal, round, and reactive to light.  Neck: No JVD present. Carotid bruit is not present. No edema present. No thyroid mass and no thyromegaly present.  Cardiovascular: S1 normal and S2 normal.  Exam reveals no gallop.   No murmur heard. Pulses:      Dorsalis pedis pulses are 2+ on the right side, and 2+ on the left side.  Respiratory: No respiratory distress. She has no wheezes. She has no rhonchi. She has no rales.  GI: Soft. Bowel sounds are normal. There is no tenderness.  Musculoskeletal:       Right ankle: She exhibits swelling.       Left ankle: She exhibits swelling.  Lymphadenopathy:   She has no cervical adenopathy.  Neurological: She is alert and oriented to person, place, and time. No cranial nerve deficit.  Skin: Skin is warm. No rash noted. Nails show no clubbing.  Psychiatric: She has a normal mood and affect.      Data Reviewed: Basic Metabolic Panel:  Recent Labs Lab 03/12/16 1715 03/13/16 0249 03/13/16 1418 03/14/16 0229  NA 137 137 138 137  K 2.3* 2.6* 3.5 3.8  CL 102 105 108 109  CO2 26 25 24 23   GLUCOSE 123* 151* 129* 139*  BUN 10 10 9 10   CREATININE 0.77 0.82 0.69 0.70  CALCIUM 9.1 8.5* 8.4* 8.4*  MG 1.8  --   --  2.2   Liver Function Tests:  Recent Labs Lab 03/12/16 1715  AST 17  ALT 12*  ALKPHOS 66  BILITOT 0.5  PROT 7.5  ALBUMIN 3.4*   CBC:  Recent Labs Lab 03/12/16 1715 03/13/16 0249 03/14/16 0229  WBC 9.3 8.8 8.0  HGB 9.6* 8.6* 8.9*  HCT 29.8* 27.3* 28.6*  MCV 66.1* 66.2* 67.8*  PLT 347 335 337   Cardiac Enzymes:  Recent Labs Lab 03/12/16 1715  TROPONINI <0.03     Studies: Dg Chest 2 View  Result Date: 03/12/2016 CLINICAL DATA:  Chest pain under the right breast EXAM: CHEST  2 VIEW COMPARISON:  None. FINDINGS: The heart size and mediastinal contours are within normal limits. Both lungs are clear. The visualized skeletal structures are unremarkable. Surgical clips in the right  upper quadrant. IMPRESSION: No active cardiopulmonary disease. Electronically Signed   By: Donavan Foil M.D.   On: 03/12/2016 18:34   Ct Angio Chest Pe W Or Wo Contrast  Result Date: 03/12/2016 CLINICAL DATA:  Chest pain with inspiration. On blood thinner for RIGHT lower extremity deep vein thrombosis diagnosed February 24, 2016. EXAM: CT ANGIOGRAPHY CHEST WITH CONTRAST TECHNIQUE: Multidetector CT imaging of the chest was performed using the standard protocol during bolus administration of intravenous contrast. Multiplanar CT image reconstructions and MIPs were obtained to evaluate the vascular anatomy. CONTRAST:  75 cc Isovue 370  COMPARISON:  Chest radiograph March 12, 2016 at 1829 hours FINDINGS: Large body habitus results in overall noisy image quality. CARDIOVASCULAR: Suboptimal contrast opacification of the pulmonary artery's (164 Hounsfield units, target is 250 Hounsfield units. Isodense pulmonary artery, aorta and pulmonary veins. Main pulmonary artery is not enlarged. Linear filling defect RIGHT lower lobar pulmonary artery, acute RIGHT lower lobe segmental nonocclusive pulmonary emboli. Probable RIGHT upper lobe nonocclusive subsegmental PE. Heart size is normal, mild RIGHT heart strain (RV/ LV = 1). No pericardial effusions. Thoracic aorta is normal course and caliber, unremarkable. MEDIASTINUM/NODES: No lymphadenopathy by CT size criteria. LUNGS/PLEURA: Tracheobronchial tree is patent, no pneumothorax. No pleural effusions, focal consolidations, pulmonary nodules or masses. Minimal lingular atelectasis. UPPER ABDOMEN: Included view of the abdomen status post cholecystectomy. Mild suspected hepatomegaly. MUSCULOSKELETAL: Visualized soft tissues and included osseous structures appear normal. Scattered Schmorl's nodes. Review of the MIP images confirms the above findings. IMPRESSION: Technically limited examination due to bolus timing and habitus. Acute nonocclusive RIGHT segmental to subsegmental pulmonary emboli. CT evidence of right heart strain (RV/LV Ratio = 1) associated with an increased risk of morbidity and mortality. Please activate Code PE by paging [phone]. Critical Value/emergent results were called by telephone at the time of interpretation on 03/12/2016 at 7:15 pm to Dr. Hinda Kehr , who verbally acknowledged these results. Electronically Signed   By: Elon Alas M.D.   On: 03/12/2016 19:16    Scheduled Meds: . acetaZOLAMIDE  500 mg Oral BID  . amLODipine  5 mg Oral Daily  . ferrous sulfate  325 mg Oral TID WC  . gabapentin  300 mg Oral TID  . hydrochlorothiazide  25 mg Oral Daily  . ketorolac   30 mg Intravenous Q6H  . [START ON 03/15/2016] metFORMIN  1,000 mg Oral Q breakfast  . pantoprazole  40 mg Oral Daily  . sodium chloride flush  3 mL Intravenous Q12H  . warfarin  10 mg Oral ONCE-1800  . Warfarin - Pharmacist Dosing Inpatient   Does not apply q1800  . zolpidem  10 mg Oral QHS   Continuous Infusions: . heparin 2,200 Units/hr (03/14/16 1204)    Assessment/Plan:  1. Pulmonary embolism with right heart strain. Continue therapy with heparin and Coumadin pharmacy adjusting her Coumadin dosage 2. Pseudotumor cerebri on Diamox complains of headache I will give dose of Toradol and  diludid 3. Hypokalemia and hypomagnesemia. Replace IV magnesium and potassium and oral potassium. Recheck electrolytes tomorrow morning. Recheck electrolytes tomorrow morning. 4. Essential hypertension on amlodipine 5. Neuropathy on gabapentin 6. Heavy menstrual bleeding which will be worse being on blood thinner. 7. Iron deficiency anemia. Ferritin very low we'll give her iron tomorrow  Code Status:     Code Status Orders        Start     Ordered   03/12/16 2245  Full code  Continuous     03/12/16 2244  Code Status History    Date Active Date Inactive Code Status Order ID Comments User Context   This patient has a current code status but no historical code status.      Disposition Plan: Once INR therapeutic and go home  Consultants:  Vascular surgery  Time spent: 30 minutes  [place], [facility]

## 2016-03-14 NOTE — Progress Notes (Signed)
ANTICOAGULATION CONSULT NOTE - Follow Up  Pharmacy Consult for warfarin  Indication: Newly diagnosed PE   Allergies  Allergen Reactions  . Lisinopril Cough    Patient Measurements: Height: 5\' 9"  (175.3 cm) Weight: (!) 369 lb 3.2 oz (167.5 kg) IBW/kg (Calculated) : 66.2  Vital Signs: Temp: 97.9 F (36.6 C) (12/02 0350) Temp Source: Oral (12/02 0350) BP: 110/53 (12/02 0350) Pulse Rate: 97 (12/02 0350)  Labs:  Recent Labs  03/12/16 1715 03/13/16 0249 03/13/16 0956 03/13/16 1418 03/13/16 1824 03/14/16 0229 03/14/16 0902  HGB 9.6* 8.6*  --   --   --  8.9*  --   HCT 29.8* 27.3*  --   --   --  28.6*  --   PLT 347 335  --   --   --  337  --   APTT 60* 53* 51*  --  49* 58* 52*  LABPROT 21.6*  --   --  13.8  --  13.3  --   INR 1.85  --   --  1.06  --  1.01  --   HEPARINUNFRC >3.60* 1.13* 0.57  --  0.22* 0.16*  --   CREATININE 0.77 0.82  --  0.69  --  0.70  --   TROPONINI <0.03  --   --   --   --   --   --     Estimated Creatinine Clearance: 174.8 mL/min (by C-G formula based on SCr of 0.7 mg/dL).   Medical History: Past Medical History:  Diagnosis Date  . Abnormal vaginal bleeding   . Anxiety   . Depression   . DVT (deep venous thrombosis) ([place])   . Fibroid   . Hypertension   . Obesity   . Sinus congestion     Medications:  Prescriptions Prior to Admission  Medication Sig Dispense Refill Last Dose  . acetaZOLAMIDE (DIAMOX) 250 MG tablet Take 500 mg by mouth 2 (two) times daily.   03/12/2016 at 0830  . amLODipine (NORVASC) 5 MG tablet Take 5 mg by mouth daily.   03/12/2016 at 0830  . clonazePAM (KLONOPIN) 0.5 MG tablet Take 0.5 mg by mouth 2 (two) times daily as needed for anxiety.   prn at prn  . ferrous sulfate 325 (65 FE) MG tablet Take 325 mg by mouth 3 (three) times daily with meals.   03/12/2016 at 1430  . gabapentin (NEURONTIN) 300 MG capsule Take 300 mg by mouth 3 (three) times daily.   03/12/2016 at 1430  . hydrochlorothiazide (HYDRODIURIL) 25 MG  tablet Take 25 mg by mouth daily.    03/12/2016 at 0830  . HYDROcodone-acetaminophen (NORCO/VICODIN) 5-325 MG tablet Take 1-2 tablets by mouth every 4 (four) hours as needed for moderate pain.   Past Week at Unknown time  . metFORMIN (GLUCOPHAGE-XR) 500 MG 24 hr tablet Take 1,000 mg by mouth daily with breakfast.   03/12/2016 at 0830  . omeprazole (PRILOSEC) 20 MG capsule Take 20 mg by mouth daily.   03/12/2016 at 1200  . Rivaroxaban 15 & 20 MG TBPK Take as directed on package: Start with one 15mg  tablet by mouth twice a day with food. On Day 22, switch to one 20mg  tablet once a day with food. (Patient taking differently: Take 15-20 mg by mouth 2 (two) times daily. Take as directed on package: Start with one 15mg  tablet by mouth twice a day with food. On Day 22, switch to one 20mg  tablet once a day with food.) 51 each 0  03/12/2016 at 0830   Scheduled:  . acetaZOLAMIDE  500 mg Oral BID  . amLODipine  5 mg Oral Daily  . ferrous sulfate  325 mg Oral TID WC  . gabapentin  300 mg Oral TID  . hydrochlorothiazide  25 mg Oral Daily  . ketorolac  30 mg Intravenous Q6H  . [START ON 03/15/2016] metFORMIN  1,000 mg Oral Q breakfast  . pantoprazole  40 mg Oral Daily  . sodium chloride flush  3 mL Intravenous Q12H  . Warfarin - Pharmacist Dosing Inpatient   Does not apply q1800  . zolpidem  10 mg Oral QHS    Assessment: Pharmacy consulted to dose and monitor warfarin therapy in this 30 year old female who was previously on xarelto therapy. Per patient, she was complaint; however still developed a PE. Patient is currently on heparin gtt and being transitoned to warfarin therapy.             11/30  INR 1.85  12/1    INR 1.06  Warfarin 10mg  12/2    INR 1.01             Goal of Therapy:  INR 2-3 Monitor platelets by anticoagulation protocol: Yes   Plan:  Will give warfarin 10 mg PO x 1 dose today. Will recheck PT/INR with am labs.   Olivia Canter, RPh Clinical Pharmacist 03/14/2016,11:26  AM

## 2016-03-14 NOTE — Progress Notes (Signed)
ANTICOAGULATION CONSULT NOTE - FOLLOW UP  Pharmacy Consult for heparin drip Indication: pulmonary embolus  Allergies  Allergen Reactions  . Lisinopril Cough    Patient Measurements: Height: 5\' 9"  (175.3 cm) Weight: (!) 369 lb 3.2 oz (167.5 kg) IBW/kg (Calculated) : 66.2 Heparin Dosing Weight: 106 kg  Vital Signs: Temp: 97.9 F (36.6 C) (12/02 0350) Temp Source: Oral (12/02 0350) BP: 110/53 (12/02 0350) Pulse Rate: 97 (12/02 0350)  Labs:  Recent Labs  03/12/16 1715 03/13/16 0249 03/13/16 0956 03/13/16 1418 03/13/16 1824 03/14/16 0229 03/14/16 0902  HGB 9.6* 8.6*  --   --   --  8.9*  --   HCT 29.8* 27.3*  --   --   --  28.6*  --   PLT 347 335  --   --   --  337  --   APTT 60* 53* 51*  --  49* 58* 52*  LABPROT 21.6*  --   --  13.8  --  13.3  --   INR 1.85  --   --  1.06  --  1.01  --   HEPARINUNFRC >3.60* 1.13* 0.57  --  0.22* 0.16*  --   CREATININE 0.77 0.82  --  0.69  --  0.70  --   TROPONINI <0.03  --   --   --   --   --   --     Estimated Creatinine Clearance: 174.8 mL/min (by C-G formula based on SCr of 0.7 mg/dL).   Medical History: Past Medical History:  Diagnosis Date  . Abnormal vaginal bleeding   . Anxiety   . Depression   . DVT (deep venous thrombosis) ([place])   . Fibroid   . Hypertension   . Obesity   . Sinus congestion     Medications:  Prescriptions Prior to Admission  Medication Sig Dispense Refill Last Dose  . acetaZOLAMIDE (DIAMOX) 250 MG tablet Take 500 mg by mouth 2 (two) times daily.   03/12/2016 at 0830  . amLODipine (NORVASC) 5 MG tablet Take 5 mg by mouth daily.   03/12/2016 at 0830  . clonazePAM (KLONOPIN) 0.5 MG tablet Take 0.5 mg by mouth 2 (two) times daily as needed for anxiety.   prn at prn  . ferrous sulfate 325 (65 FE) MG tablet Take 325 mg by mouth 3 (three) times daily with meals.   03/12/2016 at 1430  . gabapentin (NEURONTIN) 300 MG capsule Take 300 mg by mouth 3 (three) times daily.   03/12/2016 at 1430  .  hydrochlorothiazide (HYDRODIURIL) 25 MG tablet Take 25 mg by mouth daily.    03/12/2016 at 0830  . HYDROcodone-acetaminophen (NORCO/VICODIN) 5-325 MG tablet Take 1-2 tablets by mouth every 4 (four) hours as needed for moderate pain.   Past Week at Unknown time  . metFORMIN (GLUCOPHAGE-XR) 500 MG 24 hr tablet Take 1,000 mg by mouth daily with breakfast.   03/12/2016 at 0830  . omeprazole (PRILOSEC) 20 MG capsule Take 20 mg by mouth daily.   03/12/2016 at 1200  . Rivaroxaban 15 & 20 MG TBPK Take as directed on package: Start with one 15mg  tablet by mouth twice a day with food. On Day 22, switch to one 20mg  tablet once a day with food. (Patient taking differently: Take 15-20 mg by mouth 2 (two) times daily. Take as directed on package: Start with one 15mg  tablet by mouth twice a day with food. On Day 22, switch to one 20mg  tablet once a day with food.)  51 each 0 03/12/2016 at 0830   Scheduled:  . acetaZOLAMIDE  500 mg Oral BID  . amLODipine  5 mg Oral Daily  . ferrous sulfate  325 mg Oral TID WC  . gabapentin  300 mg Oral TID  . hydrochlorothiazide  25 mg Oral Daily  . ketorolac  30 mg Intravenous Q6H  . [START ON 03/15/2016] metFORMIN  1,000 mg Oral Q breakfast  . pantoprazole  40 mg Oral Daily  . sodium chloride flush  3 mL Intravenous Q12H  . Warfarin - Pharmacist Dosing Inpatient   Does not apply q1800  . zolpidem  10 mg Oral QHS   Infusions:  . heparin      Assessment: Pharmacy consulted to dose and monitor heparin drip in this 30 year old female for a newly diagnosed PE. Patient was recently diagnosed with a DVT and prescribed rivaroxaban 15 mg PO BID x 21 days followed by 20 mg once daily.  Today is day #17 of rivaroxaban BID treatment dose. Patient reports compliance with last dose taken at 0830 this morning. She presents with SOB and is diagnosed with a new PE this evening. Per MD, this is a rivaroxaban treatment failure.  11/30 Gave 3000 units bolus x 1 (half-bolus as patient took  rivaroxaban that am) Started heparin infusion at 1700 units/hr  12/1 03:00 aPTT 53, heparin level 1.13. Increase rate to 1800 units/hr and recheck in 6 hours.  12/1: aPTT 51; Heparin level 0.51. Will increase heparin gtt rate to 1900 units/hr and will recheck heparin level and APTT @ 17:00.   Goal of Therapy:  Goal APTT 66 - 102 s Heparin level 0.3-0.7 units/ml Monitor platelets by anticoagulation protocol: Yes   Plan:  Check anti-Xa level in 6 hours and daily while on heparin Continue to monitor H&H and platelets Need to monitor APTT due to elevated baseline antiXa level  12/1 1830 APTT = 49. Per RN, no disruptions in therapy and rate was running as ordered. Will increase rate to 2000 units/hr and recheck 6 hours from rate change (12/2 0130)  12/2 02:30 aPTT 58, heparin level 0.16. Increase to 2100 units/hr and recheck in 6 hours.  12/2 11:30 aPTT = 52. Increased drip rate to 2200 units/hr. Recheck in 6 hours at 17:30.    Olivia Canter [facility] Clinical Pharmacist 03/14/2016 11:22 AM

## 2016-03-15 LAB — BASIC METABOLIC PANEL
ANION GAP: 6 (ref 5–15)
BUN: 9 mg/dL (ref 6–20)
CALCIUM: 8.8 mg/dL — AB (ref 8.9–10.3)
CO2: 23 mmol/L (ref 22–32)
Chloride: 105 mmol/L (ref 101–111)
Creatinine, Ser: 0.73 mg/dL (ref 0.44–1.00)
Glucose, Bld: 129 mg/dL — ABNORMAL HIGH (ref 65–99)
POTASSIUM: 3.4 mmol/L — AB (ref 3.5–5.1)
Sodium: 134 mmol/L — ABNORMAL LOW (ref 135–145)

## 2016-03-15 LAB — CBC
HEMATOCRIT: 28.5 % — AB (ref 35.0–47.0)
Hemoglobin: 9 g/dL — ABNORMAL LOW (ref 12.0–16.0)
MCH: 21.4 pg — ABNORMAL LOW (ref 26.0–34.0)
MCHC: 31.5 g/dL — ABNORMAL LOW (ref 32.0–36.0)
MCV: 68.1 fL — ABNORMAL LOW (ref 80.0–100.0)
PLATELETS: 367 10*3/uL (ref 150–440)
RBC: 4.18 MIL/uL (ref 3.80–5.20)
RDW: 21.2 % — AB (ref 11.5–14.5)
WBC: 8.4 10*3/uL (ref 3.6–11.0)

## 2016-03-15 LAB — HEPARIN LEVEL (UNFRACTIONATED)
HEPARIN UNFRACTIONATED: 0.32 [IU]/mL (ref 0.30–0.70)
Heparin Unfractionated: 0.11 IU/mL — ABNORMAL LOW (ref 0.30–0.70)
Heparin Unfractionated: 0.47 IU/mL (ref 0.30–0.70)

## 2016-03-15 LAB — PROTIME-INR
INR: 1.36
Prothrombin Time: 16.9 seconds — ABNORMAL HIGH (ref 11.4–15.2)

## 2016-03-15 LAB — APTT: APTT: 54 s — AB (ref 24–36)

## 2016-03-15 MED ORDER — WARFARIN SODIUM 5 MG PO TABS
10.0000 mg | ORAL_TABLET | Freq: Once | ORAL | Status: AC
  Administered 2016-03-15: 10 mg via ORAL
  Filled 2016-03-15: qty 2

## 2016-03-15 MED ORDER — POTASSIUM CHLORIDE 20 MEQ PO PACK
20.0000 meq | PACK | Freq: Two times a day (BID) | ORAL | Status: AC
  Administered 2016-03-15 (×2): 20 meq via ORAL
  Filled 2016-03-15 (×2): qty 1

## 2016-03-15 MED ORDER — HEPARIN BOLUS VIA INFUSION
3200.0000 [IU] | Freq: Once | INTRAVENOUS | Status: AC
  Administered 2016-03-15: 3200 [IU] via INTRAVENOUS
  Filled 2016-03-15: qty 3200

## 2016-03-15 MED ORDER — SODIUM CHLORIDE 0.9 % IV SOLN
500.0000 mg | Freq: Once | INTRAVENOUS | Status: AC
  Administered 2016-03-15: 500 mg via INTRAVENOUS
  Filled 2016-03-15: qty 25

## 2016-03-15 NOTE — Progress Notes (Signed)
ANTICOAGULATION CONSULT NOTE - FOLLOW UP  Pharmacy Consult for heparin drip Indication: pulmonary embolus  Allergies  Allergen Reactions  . Lisinopril Cough    Patient Measurements: Height: 5\' 9"  (175.3 cm) Weight: (!) 369 lb 3.2 oz (167.5 kg) IBW/kg (Calculated) : 66.2 Heparin Dosing Weight: 106 kg  Vital Signs: Temp: 98.4 F (36.9 C) (12/03 0504) Temp Source: Oral (12/03 0504) BP: 108/89 (12/03 0504) Pulse Rate: 126 (12/03 0504)  Labs:  Recent Labs  03/12/16 1715 03/13/16 0249  03/13/16 1418  03/14/16 0229 03/14/16 0902 03/14/16 1708 03/15/16 0054 03/15/16 0802  HGB 9.6* 8.6*  --   --   --  8.9*  --   --   --  9.0*  HCT 29.8* 27.3*  --   --   --  28.6*  --   --   --  28.5*  PLT 347 335  --   --   --  337  --   --   --  367  APTT 60* 53*  < >  --   < > 58* 52* 65* 54*  --   LABPROT 21.6*  --   --  13.8  --  13.3  --   --   --  16.9*  INR 1.85  --   --  1.06  --  1.01  --   --   --  1.36  HEPARINUNFRC >3.60* 1.13*  < >  --   < > 0.16*  --   --  0.11* 0.32  CREATININE 0.77 0.82  --  0.69  --  0.70  --   --   --  0.73  TROPONINI <0.03  --   --   --   --   --   --   --   --   --   < > = values in this interval not displayed.  Estimated Creatinine Clearance: 174.8 mL/min (by C-G formula based on SCr of 0.73 mg/dL).   Medical History: Past Medical History:  Diagnosis Date  . Abnormal vaginal bleeding   . Anxiety   . Depression   . DVT (deep venous thrombosis) ([place])   . Fibroid   . Hypertension   . Obesity   . Sinus congestion     Medications:  Prescriptions Prior to Admission  Medication Sig Dispense Refill Last Dose  . acetaZOLAMIDE (DIAMOX) 250 MG tablet Take 500 mg by mouth 2 (two) times daily.   03/12/2016 at 0830  . amLODipine (NORVASC) 5 MG tablet Take 5 mg by mouth daily.   03/12/2016 at 0830  . clonazePAM (KLONOPIN) 0.5 MG tablet Take 0.5 mg by mouth 2 (two) times daily as needed for anxiety.   prn at prn  . ferrous sulfate 325 (65 FE) MG tablet  Take 325 mg by mouth 3 (three) times daily with meals.   03/12/2016 at 1430  . gabapentin (NEURONTIN) 300 MG capsule Take 300 mg by mouth 3 (three) times daily.   03/12/2016 at 1430  . hydrochlorothiazide (HYDRODIURIL) 25 MG tablet Take 25 mg by mouth daily.    03/12/2016 at 0830  . HYDROcodone-acetaminophen (NORCO/VICODIN) 5-325 MG tablet Take 1-2 tablets by mouth every 4 (four) hours as needed for moderate pain.   Past Week at Unknown time  . metFORMIN (GLUCOPHAGE-XR) 500 MG 24 hr tablet Take 1,000 mg by mouth daily with breakfast.   03/12/2016 at 0830  . omeprazole (PRILOSEC) 20 MG capsule Take 20 mg by mouth daily.   03/12/2016 at  1200  . Rivaroxaban 15 & 20 MG TBPK Take as directed on package: Start with one 15mg  tablet by mouth twice a day with food. On Day 22, switch to one 20mg  tablet once a day with food. (Patient taking differently: Take 15-20 mg by mouth 2 (two) times daily. Take as directed on package: Start with one 15mg  tablet by mouth twice a day with food. On Day 22, switch to one 20mg  tablet once a day with food.) 51 each 0 03/12/2016 at 0830   Scheduled:  . acetaZOLAMIDE  500 mg Oral BID  . amLODipine  5 mg Oral Daily  . gabapentin  300 mg Oral TID  . hydrochlorothiazide  25 mg Oral Daily  . iron sucrose  500 mg Intravenous Once  . metFORMIN  1,000 mg Oral Q breakfast  . pantoprazole  40 mg Oral Daily  . potassium chloride  20 mEq Oral BID  . sodium chloride flush  3 mL Intravenous Q12H  . Warfarin - Pharmacist Dosing Inpatient   Does not apply q1800  . zolpidem  10 mg Oral QHS   Infusions:  . heparin 2,700 Units/hr (03/15/16 0827)    Assessment: Pharmacy consulted to dose and monitor heparin drip in this 30 year old female for a newly diagnosed PE. Patient was recently diagnosed with a DVT and prescribed rivaroxaban 15 mg PO BID x 21 days followed by 20 mg once daily.  Today is day #17 of rivaroxaban BID treatment dose. Patient reports compliance with last dose taken at  0830 this morning. She presents with SOB and is diagnosed with a new PE this evening. Per MD, this is a rivaroxaban treatment failure.  11/30 Gave 3000 units bolus x 1 (half-bolus as patient took rivaroxaban that am) Started heparin infusion at 1700 units/hr  12/1 03:00 aPTT 53, heparin level 1.13. Increase rate to 1800 units/hr and recheck in 6 hours.  12/1: aPTT 51; Heparin level 0.51. Will increase heparin gtt rate to 1900 units/hr and will recheck heparin level and APTT @ 17:00.   Goal of Therapy:  Goal APTT 66 - 102 s Heparin level 0.3-0.7 units/ml Monitor platelets by anticoagulation protocol: Yes   Plan:  Check anti-Xa level in 6 hours and daily while on heparin Continue to monitor H&H and platelets Need to monitor APTT due to elevated baseline antiXa level  12/1 1830 APTT = 49. Per RN, no disruptions in therapy and rate was running as ordered. Will increase rate to 2000 units/hr and recheck 6 hours from rate change (12/2 0130)  12/2 02:30 aPTT 58, heparin level 0.16. Increase to 2100 units/hr and recheck in 6 hours.  12/2 11:30 aPTT = 52. Increased drip rate to 2200 units/hr. Recheck in 6 hours at 17:30.    12/2 1730 aPTT= 65. Will increase infusion to 2300 units/hr. Recheck in 6 hours.   12/3 01:00 aPTT 54, heparin level 0.11.  Since heparin level appears to continue to coinicide with aPTT as subtherapeutic, will dose by heparin levels alone. 3200 unit bolus and increase to 2700 units/hr. Recheck heparin level in 6 hours. CBC in AM.  12/3 0800 HL = 0.32.  Will recheck HL at 14:00.  Olivia Canter [facility] Clinical Pharmacist 03/15/2016 9:18 AM

## 2016-03-15 NOTE — Progress Notes (Signed)
Patient ID: Heather Cherry, female   DOB: February 22, 1986, 30 y.o.   MRN: [id number]  [facility] PROGRESS NOTE  Heather Cherry [id number] DOB: [birth year] DOA: 03/12/2016 PCP: Heather Obey, MD  HPI/Subjective: Headache is improved. Slept better last night. INR still not therapeutic. Objective: Vitals:   03/14/16 1953 03/15/16 0504  BP: (!) 139/50 108/89  Pulse: 95 (!) 126  Resp: 16 18  Temp: 98.8 F (37.1 C) 98.4 F (36.9 C)    Filed Weights   03/12/16 1704 03/12/16 2246  Weight: (!) 358 lb (162.4 kg) (!) 369 lb 3.2 oz (167.5 kg)    ROS: Review of Systems  Constitutional: Negative for chills and fever.  Eyes: Negative for blurred vision.  Respiratory: Negative for cough and shortness of breath.   Cardiovascular: Negative for chest pain.  Gastrointestinal: Negative for abdominal pain, constipation, diarrhea, nausea and vomiting.  Genitourinary: Negative for dysuria.  Musculoskeletal: Negative for back pain and joint pain.  Neurological: Positive for headaches. Negative for dizziness.   Exam: Physical Exam  Constitutional: She is oriented to person, place, and time.  HENT:  Nose: No mucosal edema.  Mouth/Throat: No oropharyngeal exudate or posterior oropharyngeal edema.  Eyes: Conjunctivae, EOM and lids are normal. Pupils are equal, round, and reactive to light.  Neck: No JVD present. Carotid bruit is not present. No edema present. No thyroid mass and no thyromegaly present.  Cardiovascular: S1 normal and S2 normal.  Exam reveals no gallop.   No murmur heard. Pulses:      Dorsalis pedis pulses are 2+ on the right side, and 2+ on the left side.  Respiratory: No respiratory distress. She has no wheezes. She has no rhonchi. She has no rales.  GI: Soft. Bowel sounds are normal. There is no tenderness.  Musculoskeletal:       Right ankle: She exhibits swelling.       Left ankle: She exhibits swelling.  Lymphadenopathy:    She has no cervical adenopathy.  Neurological:  She is alert and oriented to person, place, and time. No cranial nerve deficit.  Skin: Skin is warm. No rash noted. Nails show no clubbing.  Psychiatric: She has a normal mood and affect.      Data Reviewed: Basic Metabolic Panel:  Recent Labs Lab 03/12/16 1715 03/13/16 0249 03/13/16 1418 03/14/16 0229 03/15/16 0802  NA 137 137 138 137 134*  K 2.3* 2.6* 3.5 3.8 3.4*  CL 102 105 108 109 105  CO2 26 25 24 23 23   GLUCOSE 123* 151* 129* 139* 129*  BUN 10 10 9 10 9   CREATININE 0.77 0.82 0.69 0.70 0.73  CALCIUM 9.1 8.5* 8.4* 8.4* 8.8*  MG 1.8  --   --  2.2  --    Liver Function Tests:  Recent Labs Lab 03/12/16 1715  AST 17  ALT 12*  ALKPHOS 66  BILITOT 0.5  PROT 7.5  ALBUMIN 3.4*   CBC:  Recent Labs Lab 03/12/16 1715 03/13/16 0249 03/14/16 0229 03/15/16 0802  WBC 9.3 8.8 8.0 8.4  HGB 9.6* 8.6* 8.9* 9.0*  HCT 29.8* 27.3* 28.6* 28.5*  MCV 66.1* 66.2* 67.8* 68.1*  PLT 347 335 337 367   Cardiac Enzymes:  Recent Labs Lab 03/12/16 1715  TROPONINI <0.03     Studies: No results found.  Scheduled Meds: . acetaZOLAMIDE  500 mg Oral BID  . amLODipine  5 mg Oral Daily  . gabapentin  300 mg Oral TID  . hydrochlorothiazide  25 mg Oral Daily  .  iron sucrose  500 mg Intravenous Once  . metFORMIN  1,000 mg Oral Q breakfast  . pantoprazole  40 mg Oral Daily  . potassium chloride  20 mEq Oral BID  . sodium chloride flush  3 mL Intravenous Q12H  . Warfarin - Pharmacist Dosing Inpatient   Does not apply q1800  . zolpidem  10 mg Oral QHS   Continuous Infusions: . heparin 2,700 Units/hr (03/15/16 0827)    Assessment/Plan:  1. Pulmonary embolism with right heart strain. Continue therapy with heparin and Coumadin, INR going up slowly. 2. Pseudotumor cerebri on Diamox complains of headache continue when necessary pain medications  3. Hypokalemia and hypomagnesemia. Oral potassium supplement 4. Essential hypertension on amlodipine 5. Neuropathy on  gabapentin 6. Heavy menstrual bleeding which will be worse being on blood thinner. Iron deficiency anemia. Ferritin very low IV iron infusion today   Code Status:     Code Status Orders        Start     Ordered   03/12/16 2245  Full code  Continuous     03/12/16 2244    Code Status History    Date Active Date Inactive Code Status Order ID Comments User Context   This patient has a current code status but no historical code status.      Disposition Plan: Once INR therapeutic and go home  Consultants:  Vascular surgery  Time spent: 46min   Valkyrie Guardiola, [facility]

## 2016-03-15 NOTE — Progress Notes (Signed)
ANTICOAGULATION CONSULT NOTE - FOLLOW UP  Pharmacy Consult for heparin drip Indication: pulmonary embolus  Allergies  Allergen Reactions  . Lisinopril Cough    Patient Measurements: Height: 5\' 9"  (175.3 cm) Weight: (!) 369 lb 3.2 oz (167.5 kg) IBW/kg (Calculated) : 66.2 Heparin Dosing Weight: 106 kg  Vital Signs: Temp: 98.7 F (37.1 C) (12/03 1439) Temp Source: Oral (12/03 1439) BP: 112/57 (12/03 1439) Pulse Rate: 96 (12/03 1439)  Labs:  Recent Labs  03/12/16 1715 03/13/16 0249  03/13/16 1418  03/14/16 0229 03/14/16 0902 03/14/16 1708 03/15/16 0054 03/15/16 0802 03/15/16 1409  HGB 9.6* 8.6*  --   --   --  8.9*  --   --   --  9.0*  --   HCT 29.8* 27.3*  --   --   --  28.6*  --   --   --  28.5*  --   PLT 347 335  --   --   --  337  --   --   --  367  --   APTT 60* 53*  < >  --   < > 58* 52* 65* 54*  --   --   LABPROT 21.6*  --   --  13.8  --  13.3  --   --   --  16.9*  --   INR 1.85  --   --  1.06  --  1.01  --   --   --  1.36  --   HEPARINUNFRC >3.60* 1.13*  < >  --   < > 0.16*  --   --  0.11* 0.32 0.47  CREATININE 0.77 0.82  --  0.69  --  0.70  --   --   --  0.73  --   TROPONINI <0.03  --   --   --   --   --   --   --   --   --   --   < > = values in this interval not displayed.  Estimated Creatinine Clearance: 174.8 mL/min (by C-G formula based on SCr of 0.73 mg/dL).   Medical History: Past Medical History:  Diagnosis Date  . Abnormal vaginal bleeding   . Anxiety   . Depression   . DVT (deep venous thrombosis) ([place])   . Fibroid   . Hypertension   . Obesity   . Sinus congestion     Medications:  Prescriptions Prior to Admission  Medication Sig Dispense Refill Last Dose  . acetaZOLAMIDE (DIAMOX) 250 MG tablet Take 500 mg by mouth 2 (two) times daily.   03/12/2016 at 0830  . amLODipine (NORVASC) 5 MG tablet Take 5 mg by mouth daily.   03/12/2016 at 0830  . clonazePAM (KLONOPIN) 0.5 MG tablet Take 0.5 mg by mouth 2 (two) times daily as needed for  anxiety.   prn at prn  . ferrous sulfate 325 (65 FE) MG tablet Take 325 mg by mouth 3 (three) times daily with meals.   03/12/2016 at 1430  . gabapentin (NEURONTIN) 300 MG capsule Take 300 mg by mouth 3 (three) times daily.   03/12/2016 at 1430  . hydrochlorothiazide (HYDRODIURIL) 25 MG tablet Take 25 mg by mouth daily.    03/12/2016 at 0830  . HYDROcodone-acetaminophen (NORCO/VICODIN) 5-325 MG tablet Take 1-2 tablets by mouth every 4 (four) hours as needed for moderate pain.   Past Week at Unknown time  . metFORMIN (GLUCOPHAGE-XR) 500 MG 24 hr tablet Take 1,000 mg  by mouth daily with breakfast.   03/12/2016 at 0830  . omeprazole (PRILOSEC) 20 MG capsule Take 20 mg by mouth daily.   03/12/2016 at 1200  . Rivaroxaban 15 & 20 MG TBPK Take as directed on package: Start with one 15mg  tablet by mouth twice a day with food. On Day 22, switch to one 20mg  tablet once a day with food. (Patient taking differently: Take 15-20 mg by mouth 2 (two) times daily. Take as directed on package: Start with one 15mg  tablet by mouth twice a day with food. On Day 22, switch to one 20mg  tablet once a day with food.) 51 each 0 03/12/2016 at 0830   Scheduled:  . acetaZOLAMIDE  500 mg Oral BID  . amLODipine  5 mg Oral Daily  . gabapentin  300 mg Oral TID  . hydrochlorothiazide  25 mg Oral Daily  . metFORMIN  1,000 mg Oral Q breakfast  . pantoprazole  40 mg Oral Daily  . potassium chloride  20 mEq Oral BID  . sodium chloride flush  3 mL Intravenous Q12H  . Warfarin - Pharmacist Dosing Inpatient   Does not apply q1800  . zolpidem  10 mg Oral QHS   Infusions:  . heparin 2,700 Units/hr (03/15/16 0827)    Assessment: Pharmacy consulted to dose and monitor heparin drip in this 30 year old female for a newly diagnosed PE. Patient was recently diagnosed with a DVT and prescribed rivaroxaban 15 mg PO BID x 21 days followed by 20 mg once daily.  Today is day #17 of rivaroxaban BID treatment dose. Patient reports compliance  with last dose taken at 0830 this morning. She presents with SOB and is diagnosed with a new PE this evening. Per MD, this is a rivaroxaban treatment failure.  11/30 Gave 3000 units bolus x 1 (half-bolus as patient took rivaroxaban that am) Started heparin infusion at 1700 units/hr  12/1 03:00 aPTT 53, heparin level 1.13. Increase rate to 1800 units/hr and recheck in 6 hours.  12/1: aPTT 51; Heparin level 0.51. Will increase heparin gtt rate to 1900 units/hr and will recheck heparin level and APTT @ 17:00.   Goal of Therapy:  Goal APTT 66 - 102 s Heparin level 0.3-0.7 units/ml Monitor platelets by anticoagulation protocol: Yes   Plan:  Check anti-Xa level in 6 hours and daily while on heparin Continue to monitor H&H and platelets Need to monitor APTT due to elevated baseline antiXa level  12/1 1830 APTT = 49. Per RN, no disruptions in therapy and rate was running as ordered. Will increase rate to 2000 units/hr and recheck 6 hours from rate change (12/2 0130)  12/2 02:30 aPTT 58, heparin level 0.16. Increase to 2100 units/hr and recheck in 6 hours.  12/2 11:30 aPTT = 52. Increased drip rate to 2200 units/hr. Recheck in 6 hours at 17:30.    12/2 1730 aPTT= 65. Will increase infusion to 2300 units/hr. Recheck in 6 hours.   12/3 01:00 aPTT 54, heparin level 0.11.  Since heparin level appears to continue to coinicide with aPTT as subtherapeutic, will dose by heparin levels alone. 3200 unit bolus and increase to 2700 units/hr. Recheck heparin level in 6 hours. CBC in AM.  12/3 0800 HL = 0.32.  Will recheck HL at 14:00. 12/3 1409 HL = 0.47.  Will recheck HL with am labs.  Olivia Canter [facility] Clinical Pharmacist 03/15/2016 3:12 PM

## 2016-03-15 NOTE — Progress Notes (Signed)
ANTICOAGULATION CONSULT NOTE - Follow Up  Pharmacy Consult for warfarin  Indication: Newly diagnosed PE   Allergies  Allergen Reactions  . Lisinopril Cough    Patient Measurements: Height: 5\' 9"  (175.3 cm) Weight: (!) 369 lb 3.2 oz (167.5 kg) IBW/kg (Calculated) : 66.2  Vital Signs: Temp: 98.7 F (37.1 C) (12/03 1439) Temp Source: Oral (12/03 1439) BP: 112/57 (12/03 1439) Pulse Rate: 96 (12/03 1439)  Labs:  Recent Labs  03/13/16 0249  03/13/16 1418  03/14/16 0229 03/14/16 0902 03/14/16 1708 03/15/16 0054 03/15/16 0802 03/15/16 1409  HGB 8.6*  --   --   --  8.9*  --   --   --  9.0*  --   HCT 27.3*  --   --   --  28.6*  --   --   --  28.5*  --   PLT 335  --   --   --  337  --   --   --  367  --   APTT 53*  < >  --   < > 58* 52* 65* 54*  --   --   LABPROT  --   --  13.8  --  13.3  --   --   --  16.9*  --   INR  --   --  1.06  --  1.01  --   --   --  1.36  --   HEPARINUNFRC 1.13*  < >  --   < > 0.16*  --   --  0.11* 0.32 0.47  CREATININE 0.82  --  0.69  --  0.70  --   --   --  0.73  --   < > = values in this interval not displayed.  Estimated Creatinine Clearance: 174.8 mL/min (by C-G formula based on SCr of 0.73 mg/dL).   Medical History: Past Medical History:  Diagnosis Date  . Abnormal vaginal bleeding   . Anxiety   . Depression   . DVT (deep venous thrombosis) ([place])   . Fibroid   . Hypertension   . Obesity   . Sinus congestion     Medications:  Prescriptions Prior to Admission  Medication Sig Dispense Refill Last Dose  . acetaZOLAMIDE (DIAMOX) 250 MG tablet Take 500 mg by mouth 2 (two) times daily.   03/12/2016 at 0830  . amLODipine (NORVASC) 5 MG tablet Take 5 mg by mouth daily.   03/12/2016 at 0830  . clonazePAM (KLONOPIN) 0.5 MG tablet Take 0.5 mg by mouth 2 (two) times daily as needed for anxiety.   prn at prn  . ferrous sulfate 325 (65 FE) MG tablet Take 325 mg by mouth 3 (three) times daily with meals.   03/12/2016 at 1430  . gabapentin  (NEURONTIN) 300 MG capsule Take 300 mg by mouth 3 (three) times daily.   03/12/2016 at 1430  . hydrochlorothiazide (HYDRODIURIL) 25 MG tablet Take 25 mg by mouth daily.    03/12/2016 at 0830  . HYDROcodone-acetaminophen (NORCO/VICODIN) 5-325 MG tablet Take 1-2 tablets by mouth every 4 (four) hours as needed for moderate pain.   Past Week at Unknown time  . metFORMIN (GLUCOPHAGE-XR) 500 MG 24 hr tablet Take 1,000 mg by mouth daily with breakfast.   03/12/2016 at 0830  . omeprazole (PRILOSEC) 20 MG capsule Take 20 mg by mouth daily.   03/12/2016 at 1200  . Rivaroxaban 15 & 20 MG TBPK Take as directed on package: Start with one 15mg  tablet  by mouth twice a day with food. On Day 22, switch to one 20mg  tablet once a day with food. (Patient taking differently: Take 15-20 mg by mouth 2 (two) times daily. Take as directed on package: Start with one 15mg  tablet by mouth twice a day with food. On Day 22, switch to one 20mg  tablet once a day with food.) 51 each 0 03/12/2016 at 0830   Scheduled:  . acetaZOLAMIDE  500 mg Oral BID  . amLODipine  5 mg Oral Daily  . gabapentin  300 mg Oral TID  . hydrochlorothiazide  25 mg Oral Daily  . metFORMIN  1,000 mg Oral Q breakfast  . pantoprazole  40 mg Oral Daily  . potassium chloride  20 mEq Oral BID  . sodium chloride flush  3 mL Intravenous Q12H  . warfarin  10 mg Oral ONCE-1800  . Warfarin - Pharmacist Dosing Inpatient   Does not apply q1800  . zolpidem  10 mg Oral QHS    Assessment: Pharmacy consulted to dose and monitor warfarin therapy in this 30 year old female who was previously on xarelto therapy. Per patient, she was complaint; however still developed a PE. Patient is currently on heparin gtt and being transitoned to warfarin therapy.             11/30  INR 1.85  12/1    INR 1.06  Warfarin 10mg  12/2    INR 1.01  Warfarin 10mg   12/3    INR 1.36           Goal of Therapy:  INR 2-3 Monitor platelets by anticoagulation protocol: Yes   Plan:   Patient has had two doses of warfarin 10mg , Will give warfarin 10 mg PO x 1 again today. Will recheck PT/INR with am labs.   Pernell Dupre, PharmD, BCPS Clinical Pharmacist 03/15/2016 6:16 PM

## 2016-03-16 LAB — CBC
HEMATOCRIT: 27.8 % — AB (ref 35.0–47.0)
HEMOGLOBIN: 8.7 g/dL — AB (ref 12.0–16.0)
MCH: 21.2 pg — ABNORMAL LOW (ref 26.0–34.0)
MCHC: 31.3 g/dL — ABNORMAL LOW (ref 32.0–36.0)
MCV: 67.5 fL — ABNORMAL LOW (ref 80.0–100.0)
Platelets: 324 10*3/uL (ref 150–440)
RBC: 4.12 MIL/uL (ref 3.80–5.20)
RDW: 21.4 % — ABNORMAL HIGH (ref 11.5–14.5)
WBC: 8.4 10*3/uL (ref 3.6–11.0)

## 2016-03-16 LAB — BASIC METABOLIC PANEL
ANION GAP: 7 (ref 5–15)
BUN: 8 mg/dL (ref 6–20)
CHLORIDE: 107 mmol/L (ref 101–111)
CO2: 23 mmol/L (ref 22–32)
Calcium: 8.8 mg/dL — ABNORMAL LOW (ref 8.9–10.3)
Creatinine, Ser: 0.74 mg/dL (ref 0.44–1.00)
GFR calc non Af Amer: >60 mL/min (ref >60)
Glucose, Bld: 130 mg/dL — ABNORMAL HIGH (ref 65–99)
POTASSIUM: 3.6 mmol/L (ref 3.5–5.1)
Sodium: 137 mmol/L (ref 135–145)

## 2016-03-16 LAB — HEPARIN LEVEL (UNFRACTIONATED): Heparin Unfractionated: 0.33 IU/mL (ref 0.30–0.70)

## 2016-03-16 LAB — PROTIME-INR
INR: 1.63
Prothrombin Time: 19.5 seconds — ABNORMAL HIGH (ref 11.4–15.2)

## 2016-03-16 MED ORDER — BUTALBITAL-APAP-CAFFEINE 50-325-40 MG PO TABS
1.0000 | ORAL_TABLET | Freq: Four times a day (QID) | ORAL | Status: DC | PRN
  Administered 2016-03-16: 1 via ORAL
  Filled 2016-03-16 (×2): qty 1

## 2016-03-16 MED ORDER — POLYETHYLENE GLYCOL 3350 17 G PO PACK
17.0000 g | PACK | Freq: Every day | ORAL | Status: DC
  Administered 2016-03-16 – 2016-03-17 (×2): 17 g via ORAL
  Filled 2016-03-16 (×2): qty 1

## 2016-03-16 MED ORDER — DOCUSATE SODIUM 100 MG PO CAPS
200.0000 mg | ORAL_CAPSULE | Freq: Two times a day (BID) | ORAL | Status: DC
  Administered 2016-03-16 – 2016-03-17 (×3): 200 mg via ORAL
  Filled 2016-03-16 (×3): qty 2

## 2016-03-16 MED ORDER — WARFARIN SODIUM 5 MG PO TABS
10.0000 mg | ORAL_TABLET | Freq: Once | ORAL | Status: AC
  Administered 2016-03-16: 10 mg via ORAL
  Filled 2016-03-16: qty 2

## 2016-03-16 NOTE — Progress Notes (Signed)
ANTICOAGULATION CONSULT NOTE - Follow Up  Pharmacy Consult for warfarin  Indication: Newly diagnosed PE   Allergies  Allergen Reactions  . Lisinopril Cough    Patient Measurements: Height: 5\' 9"  (175.3 cm) Weight: (!) 369 lb 3.2 oz (167.5 kg) IBW/kg (Calculated) : 66.2  Vital Signs: Temp: 99.2 F (37.3 C) (12/04 1151) Temp Source: Oral (12/04 1151) BP: 109/67 (12/04 1151) Pulse Rate: 107 (12/04 1151)  Labs:  Recent Labs  03/14/16 0229 03/14/16 0902 03/14/16 1708 03/15/16 0054 03/15/16 0802 03/15/16 1409 03/16/16 0436  HGB 8.9*  --   --   --  9.0*  --  8.7*  HCT 28.6*  --   --   --  28.5*  --  27.8*  PLT 337  --   --   --  367  --  324  APTT 58* 52* 65* 54*  --   --   --   LABPROT 13.3  --   --   --  16.9*  --  19.5*  INR 1.01  --   --   --  1.36  --  1.63  HEPARINUNFRC 0.16*  --   --  0.11* 0.32 0.47 0.33  CREATININE 0.70  --   --   --  0.73  --  0.74    Estimated Creatinine Clearance: 174.8 mL/min (by C-G formula based on SCr of 0.74 mg/dL).   Medical History: Past Medical History:  Diagnosis Date  . Abnormal vaginal bleeding   . Anxiety   . Depression   . DVT (deep venous thrombosis) ([place])   . Fibroid   . Hypertension   . Obesity   . Sinus congestion     Assessment: Pharmacy consulted to dose and monitor warfarin therapy in this 30 year old female who was previously on xarelto therapy. Per patient, she was complaint; however still developed a PE. Patient is currently on heparin gtt and being transitoned to warfarin therapy.             11/30  INR 1.85  12/1    INR 1.06  Warfarin 10mg  12/2    INR 1.01  Warfarin 10mg   12/3    INR 1.36  Warfarin 10 mg 12/4    INR 1.63           Goal of Therapy:  INR 2-3 Monitor platelets by anticoagulation protocol: Yes   Plan:  INR still subtherapeutic but trending up. Due to young age, increased body habitus, and relative slow increase in INR will repeat warfarin 10 mg for tonight.  Will recheck PT/INR with am  labs.   Rocky Morel, PharmD, BCPS Clinical Pharmacist 03/16/2016 1:29 PM

## 2016-03-16 NOTE — Progress Notes (Signed)
ANTICOAGULATION CONSULT NOTE - FOLLOW UP  Pharmacy Consult for heparin drip Indication: pulmonary embolus  Allergies  Allergen Reactions  . Lisinopril Cough    Patient Measurements: Height: 5\' 9"  (175.3 cm) Weight: (!) 369 lb 3.2 oz (167.5 kg) IBW/kg (Calculated) : 66.2 Heparin Dosing Weight: 106 kg  Vital Signs: Temp: 97.8 F (36.6 C) (12/04 0416) Temp Source: Oral (12/04 0416) BP: 109/58 (12/04 0416) Pulse Rate: 91 (12/04 0416)  Labs:  Recent Labs  03/14/16 0229 03/14/16 0902 03/14/16 1708 03/15/16 0054 03/15/16 0802 03/15/16 1409 03/16/16 0436  HGB 8.9*  --   --   --  9.0*  --  8.7*  HCT 28.6*  --   --   --  28.5*  --  27.8*  PLT 337  --   --   --  367  --  324  APTT 58* 52* 65* 54*  --   --   --   LABPROT 13.3  --   --   --  16.9*  --  19.5*  INR 1.01  --   --   --  1.36  --  1.63  HEPARINUNFRC 0.16*  --   --  0.11* 0.32 0.47 0.33  CREATININE 0.70  --   --   --  0.73  --  0.74    Estimated Creatinine Clearance: 174.8 mL/min (by C-G formula based on SCr of 0.74 mg/dL).   Medical History: Past Medical History:  Diagnosis Date  . Abnormal vaginal bleeding   . Anxiety   . Depression   . DVT (deep venous thrombosis) ([place])   . Fibroid   . Hypertension   . Obesity   . Sinus congestion     Medications:  Prescriptions Prior to Admission  Medication Sig Dispense Refill Last Dose  . acetaZOLAMIDE (DIAMOX) 250 MG tablet Take 500 mg by mouth 2 (two) times daily.   03/12/2016 at 0830  . amLODipine (NORVASC) 5 MG tablet Take 5 mg by mouth daily.   03/12/2016 at 0830  . clonazePAM (KLONOPIN) 0.5 MG tablet Take 0.5 mg by mouth 2 (two) times daily as needed for anxiety.   prn at prn  . ferrous sulfate 325 (65 FE) MG tablet Take 325 mg by mouth 3 (three) times daily with meals.   03/12/2016 at 1430  . gabapentin (NEURONTIN) 300 MG capsule Take 300 mg by mouth 3 (three) times daily.   03/12/2016 at 1430  . hydrochlorothiazide (HYDRODIURIL) 25 MG tablet Take 25 mg by  mouth daily.    03/12/2016 at 0830  . HYDROcodone-acetaminophen (NORCO/VICODIN) 5-325 MG tablet Take 1-2 tablets by mouth every 4 (four) hours as needed for moderate pain.   Past Week at Unknown time  . metFORMIN (GLUCOPHAGE-XR) 500 MG 24 hr tablet Take 1,000 mg by mouth daily with breakfast.   03/12/2016 at 0830  . omeprazole (PRILOSEC) 20 MG capsule Take 20 mg by mouth daily.   03/12/2016 at 1200  . Rivaroxaban 15 & 20 MG TBPK Take as directed on package: Start with one 15mg  tablet by mouth twice a day with food. On Day 22, switch to one 20mg  tablet once a day with food. (Patient taking differently: Take 15-20 mg by mouth 2 (two) times daily. Take as directed on package: Start with one 15mg  tablet by mouth twice a day with food. On Day 22, switch to one 20mg  tablet once a day with food.) 51 each 0 03/12/2016 at 0830   Scheduled:  . acetaZOLAMIDE  500 mg  Oral BID  . amLODipine  5 mg Oral Daily  . gabapentin  300 mg Oral TID  . hydrochlorothiazide  25 mg Oral Daily  . metFORMIN  1,000 mg Oral Q breakfast  . pantoprazole  40 mg Oral Daily  . sodium chloride flush  3 mL Intravenous Q12H  . Warfarin - Pharmacist Dosing Inpatient   Does not apply q1800  . zolpidem  10 mg Oral QHS   Infusions:  . heparin 2,700 Units/hr (03/16/16 0349)    Assessment: Pharmacy consulted to dose and monitor heparin drip in this 30 year old female for a newly diagnosed PE. Patient was recently diagnosed with a DVT and prescribed rivaroxaban 15 mg PO BID x 21 days followed by 20 mg once daily.  Today is day #17 of rivaroxaban BID treatment dose. Patient reports compliance with last dose taken at 0830 this morning. She presents with SOB and is diagnosed with a new PE this evening. Per MD, this is a rivaroxaban treatment failure.  11/30 Gave 3000 units bolus x 1 (half-bolus as patient took rivaroxaban that am) Started heparin infusion at 1700 units/hr  12/1 03:00 aPTT 53, heparin level 1.13. Increase rate to 1800  units/hr and recheck in 6 hours.  12/1: aPTT 51; Heparin level 0.51. Will increase heparin gtt rate to 1900 units/hr and will recheck heparin level and APTT @ 17:00.   Goal of Therapy:  Goal APTT 66 - 102 s Heparin level 0.3-0.7 units/ml Monitor platelets by anticoagulation protocol: Yes   Plan:  Check anti-Xa level in 6 hours and daily while on heparin Continue to monitor H&H and platelets Need to monitor APTT due to elevated baseline antiXa level  12/1 1830 APTT = 49. Per RN, no disruptions in therapy and rate was running as ordered. Will increase rate to 2000 units/hr and recheck 6 hours from rate change (12/2 0130)  12/2 02:30 aPTT 58, heparin level 0.16. Increase to 2100 units/hr and recheck in 6 hours.  12/2 11:30 aPTT = 52. Increased drip rate to 2200 units/hr. Recheck in 6 hours at 17:30.    12/2 1730 aPTT= 65. Will increase infusion to 2300 units/hr. Recheck in 6 hours.   12/3 01:00 aPTT 54, heparin level 0.11.  Since heparin level appears to continue to coinicide with aPTT as subtherapeutic, will dose by heparin levels alone. 3200 unit bolus and increase to 2700 units/hr. Recheck heparin level in 6 hours. CBC in AM.  12/3 0800 HL = 0.32.  Will recheck HL at 14:00. 12/3 1409 HL = 0.47.  Will recheck HL with am labs. 12/4 0500 HL = 0.33.  Continue current regimen. Recheck heparin level and CBC tomorrow AM.  Eloise Harman, [facility] Clinical Pharmacist 03/16/2016 6:16 AM

## 2016-03-16 NOTE — Progress Notes (Signed)
Patient ID: Heather Cherry, female   DOB: Dec 22, 1985, 30 y.o.   MRN: [id number]  [facility] PROGRESS NOTE  Heather Cherry [id number] DOB: September 21, 1985 DOA: 03/12/2016 PCP: Suzanna Obey, MD  HPI/Subjective: Headache is improved. Slept better last night. INR still not therapeutic. Wants to go home Objective: Vitals:   03/16/16 1001 03/16/16 1151  BP: 112/60 109/67  Pulse:  (!) 107  Resp:  20  Temp:  99.2 F (37.3 C)    Filed Weights   03/12/16 1704 03/12/16 2246  Weight: (!) 358 lb (162.4 kg) (!) 369 lb 3.2 oz (167.5 kg)    ROS: Review of Systems  Constitutional: Negative for chills and fever.  Eyes: Negative for blurred vision.  Respiratory: Negative for cough and shortness of breath.   Cardiovascular: Negative for chest pain.  Gastrointestinal: Negative for abdominal pain, constipation, diarrhea, nausea and vomiting.  Genitourinary: Negative for dysuria.  Musculoskeletal: Negative for back pain and joint pain.  Neurological: Positive for headaches. Negative for dizziness.   Exam: Physical Exam  Constitutional: She is oriented to person, place, and time.  HENT:  Nose: No mucosal edema.  Mouth/Throat: No oropharyngeal exudate or posterior oropharyngeal edema.  Eyes: Conjunctivae, EOM and lids are normal. Pupils are equal, round, and reactive to light.  Neck: No JVD present. Carotid bruit is not present. No edema present. No thyroid mass and no thyromegaly present.  Cardiovascular: S1 normal and S2 normal.  Exam reveals no gallop.   No murmur heard. Pulses:      Dorsalis pedis pulses are 2+ on the right side, and 2+ on the left side.  Respiratory: No respiratory distress. She has no wheezes. She has no rhonchi. She has no rales.  GI: Soft. Bowel sounds are normal. There is no tenderness.  Musculoskeletal:       Right ankle: She exhibits swelling.       Left ankle: She exhibits swelling.  Lymphadenopathy:    She has no cervical adenopathy.  Neurological: She is  alert and oriented to person, place, and time. No cranial nerve deficit.  Skin: Skin is warm. No rash noted. Nails show no clubbing.  Psychiatric: She has a normal mood and affect.      Data Reviewed: Basic Metabolic Panel:  Recent Labs Lab 03/12/16 1715 03/13/16 0249 03/13/16 1418 03/14/16 0229 03/15/16 0802 03/16/16 0436  NA 137 137 138 137 134* 137  K 2.3* 2.6* 3.5 3.8 3.4* 3.6  CL 102 105 108 109 105 107  CO2 26 25 24 23 23 23   GLUCOSE 123* 151* 129* 139* 129* 130*  BUN 10 10 9 10 9 8   CREATININE 0.77 0.82 0.69 0.70 0.73 0.74  CALCIUM 9.1 8.5* 8.4* 8.4* 8.8* 8.8*  MG 1.8  --   --  2.2  --   --    Liver Function Tests:  Recent Labs Lab 03/12/16 1715  AST 17  ALT 12*  ALKPHOS 66  BILITOT 0.5  PROT 7.5  ALBUMIN 3.4*   CBC:  Recent Labs Lab 03/12/16 1715 03/13/16 0249 03/14/16 0229 03/15/16 0802 03/16/16 0436  WBC 9.3 8.8 8.0 8.4 8.4  HGB 9.6* 8.6* 8.9* 9.0* 8.7*  HCT 29.8* 27.3* 28.6* 28.5* 27.8*  MCV 66.1* 66.2* 67.8* 68.1* 67.5*  PLT 347 335 337 367 324   Cardiac Enzymes:  Recent Labs Lab 03/12/16 1715  TROPONINI <0.03     Studies: No results found.  Scheduled Meds: . acetaZOLAMIDE  500 mg Oral BID  . amLODipine  5 mg  Oral Daily  . docusate sodium  200 mg Oral BID  . gabapentin  300 mg Oral TID  . hydrochlorothiazide  25 mg Oral Daily  . metFORMIN  1,000 mg Oral Q breakfast  . pantoprazole  40 mg Oral Daily  . polyethylene glycol  17 g Oral Daily  . sodium chloride flush  3 mL Intravenous Q12H  . Warfarin - Pharmacist Dosing Inpatient   Does not apply q1800  . zolpidem  10 mg Oral QHS   Continuous Infusions: . heparin 2,700 Units/hr (03/16/16 1154)    Assessment/Plan:  1. Pulmonary embolism with right heart strain. Continue therapy with heparin and Coumadin, INR still not therapeutic continue Coumadin therapy and heparin 2. Pseudotumor cerebri on Diamox complains of headache continue when necessary pain medications   3. Hypokalemia and hypomagnesemia replaced 4. Essential hypertension on amlodipine 5. Neuropathy on gabapentin 6. Heavy menstrual bleeding which will be worse being on blood thinner. 7. Iron deficiency anemia. Replaced  \Code Status:     Code Status Orders        Start     Ordered   03/12/16 2245  Full code  Continuous     03/12/16 2244    Code Status History    Date Active Date Inactive Code Status Order ID Comments User Context   This patient has a current code status but no historical code status.      Disposition Plan: Once INR therapeutic and go home  Consultants:  Vascular surgery  Time spent: 60min   Katilyn Miltenberger, [facility]

## 2016-03-17 ENCOUNTER — Emergency Department (HOSPITAL_COMMUNITY): Payer: BC Managed Care – PPO

## 2016-03-17 ENCOUNTER — Observation Stay (HOSPITAL_COMMUNITY)
Admission: EM | Admit: 2016-03-17 | Discharge: 2016-03-20 | Disposition: A | Discharge status: Home / Self Care | Payer: BC Managed Care – PPO | Attending: Internal Medicine | Admitting: Internal Medicine

## 2016-03-17 ENCOUNTER — Encounter (HOSPITAL_COMMUNITY): Payer: Self-pay | Admitting: Emergency Medicine

## 2016-03-17 DIAGNOSIS — R609 Edema, unspecified: Secondary | ICD-10-CM

## 2016-03-17 DIAGNOSIS — Z7901 Long term (current) use of anticoagulants: Secondary | ICD-10-CM | POA: Insufficient documentation

## 2016-03-17 DIAGNOSIS — R52 Pain, unspecified: Secondary | ICD-10-CM

## 2016-03-17 DIAGNOSIS — I2699 Other pulmonary embolism without acute cor pulmonale: Secondary | ICD-10-CM | POA: Diagnosis not present

## 2016-03-17 DIAGNOSIS — E119 Type 2 diabetes mellitus without complications: Secondary | ICD-10-CM | POA: Insufficient documentation

## 2016-03-17 DIAGNOSIS — I272 Pulmonary hypertension, unspecified: Secondary | ICD-10-CM | POA: Insufficient documentation

## 2016-03-17 DIAGNOSIS — J90 Pleural effusion, not elsewhere classified: Secondary | ICD-10-CM | POA: Insufficient documentation

## 2016-03-17 DIAGNOSIS — J209 Acute bronchitis, unspecified: Secondary | ICD-10-CM | POA: Insufficient documentation

## 2016-03-17 DIAGNOSIS — F329 Major depressive disorder, single episode, unspecified: Secondary | ICD-10-CM | POA: Insufficient documentation

## 2016-03-17 DIAGNOSIS — R079 Chest pain, unspecified: Secondary | ICD-10-CM | POA: Diagnosis present

## 2016-03-17 DIAGNOSIS — R000 Tachycardia, unspecified: Secondary | ICD-10-CM

## 2016-03-17 DIAGNOSIS — D509 Iron deficiency anemia, unspecified: Secondary | ICD-10-CM | POA: Insufficient documentation

## 2016-03-17 DIAGNOSIS — Z7984 Long term (current) use of oral hypoglycemic drugs: Secondary | ICD-10-CM | POA: Insufficient documentation

## 2016-03-17 DIAGNOSIS — F419 Anxiety disorder, unspecified: Secondary | ICD-10-CM | POA: Insufficient documentation

## 2016-03-17 DIAGNOSIS — K219 Gastro-esophageal reflux disease without esophagitis: Secondary | ICD-10-CM | POA: Insufficient documentation

## 2016-03-17 DIAGNOSIS — G932 Benign intracranial hypertension: Secondary | ICD-10-CM | POA: Diagnosis present

## 2016-03-17 DIAGNOSIS — N920 Excessive and frequent menstruation with regular cycle: Secondary | ICD-10-CM | POA: Insufficient documentation

## 2016-03-17 DIAGNOSIS — Z6841 Body Mass Index (BMI) 40.0 and over, adult: Secondary | ICD-10-CM | POA: Insufficient documentation

## 2016-03-17 DIAGNOSIS — I82401 Acute embolism and thrombosis of unspecified deep veins of right lower extremity: Secondary | ICD-10-CM | POA: Insufficient documentation

## 2016-03-17 DIAGNOSIS — R0781 Pleurodynia: Secondary | ICD-10-CM | POA: Diagnosis present

## 2016-03-17 DIAGNOSIS — I10 Essential (primary) hypertension: Secondary | ICD-10-CM | POA: Insufficient documentation

## 2016-03-17 HISTORY — DX: Other pulmonary embolism without acute cor pulmonale: I26.99

## 2016-03-17 LAB — PROTIME-INR
INR: 2.3
INR: 2.08
PROTHROMBIN TIME: 25.7 s — AB (ref 11.4–15.2)
Prothrombin Time: 23.7 seconds — ABNORMAL HIGH (ref 11.4–15.2)

## 2016-03-17 LAB — CBC
HCT: 30.3 % — ABNORMAL LOW (ref 35.0–47.0)
HCT: 31.1 % — ABNORMAL LOW (ref 36.0–46.0)
Hemoglobin: 9.3 g/dL — ABNORMAL LOW (ref 12.0–16.0)
Hemoglobin: 9.2 g/dL — ABNORMAL LOW (ref 12.0–15.0)
MCH: 21.1 pg — ABNORMAL LOW (ref 26.0–34.0)
MCH: 20.7 pg — AB (ref 26.0–34.0)
MCHC: 30.6 g/dL — AB (ref 32.0–36.0)
MCHC: 29.6 g/dL — AB (ref 30.0–36.0)
MCV: 68.8 fL — ABNORMAL LOW (ref 80.0–100.0)
MCV: 70 fL — ABNORMAL LOW (ref 78.0–100.0)
PLATELETS: 309 10*3/uL (ref 150–440)
PLATELETS: 318 10*3/uL (ref 150–400)
RBC: 4.4 MIL/uL (ref 3.80–5.20)
RBC: 4.44 MIL/uL (ref 3.87–5.11)
RDW: 22.7 % — AB (ref 11.5–14.5)
RDW: 21.3 % — ABNORMAL HIGH (ref 11.5–15.5)
WBC: 8.6 10*3/uL (ref 3.6–11.0)
WBC: 9.4 10*3/uL (ref 4.0–10.5)

## 2016-03-17 LAB — BASIC METABOLIC PANEL
Anion gap: 12 (ref 5–15)
BUN: 7 mg/dL (ref 6–20)
CALCIUM: 9.3 mg/dL (ref 8.9–10.3)
CO2: 20 mmol/L — AB (ref 22–32)
CREATININE: 0.79 mg/dL (ref 0.44–1.00)
Chloride: 106 mmol/L (ref 101–111)
GFR calc Af Amer: >60 mL/min (ref >60)
GFR calc non Af Amer: >60 mL/min (ref >60)
GLUCOSE: 119 mg/dL — AB (ref 65–99)
Potassium: 3.9 mmol/L (ref 3.5–5.1)
Sodium: 138 mmol/L (ref 135–145)

## 2016-03-17 LAB — I-STAT TROPONIN, ED: TROPONIN I, POC: 0 ng/mL (ref 0.00–0.08)

## 2016-03-17 LAB — HEPARIN LEVEL (UNFRACTIONATED): HEPARIN UNFRACTIONATED: 0.23 [IU]/mL — AB (ref 0.30–0.70)

## 2016-03-17 LAB — I-STAT BETA HCG BLOOD, ED (MC, WL, AP ONLY)

## 2016-03-17 IMAGING — CR DG CHEST 2V
2 series · 2 of 2 positions shown · non-contrast
Comparison: [DATE]

CLINICAL DATA: Chest pain and dyspnea for 5 days.

EXAM:
CHEST  2 VIEW

[chest pa]
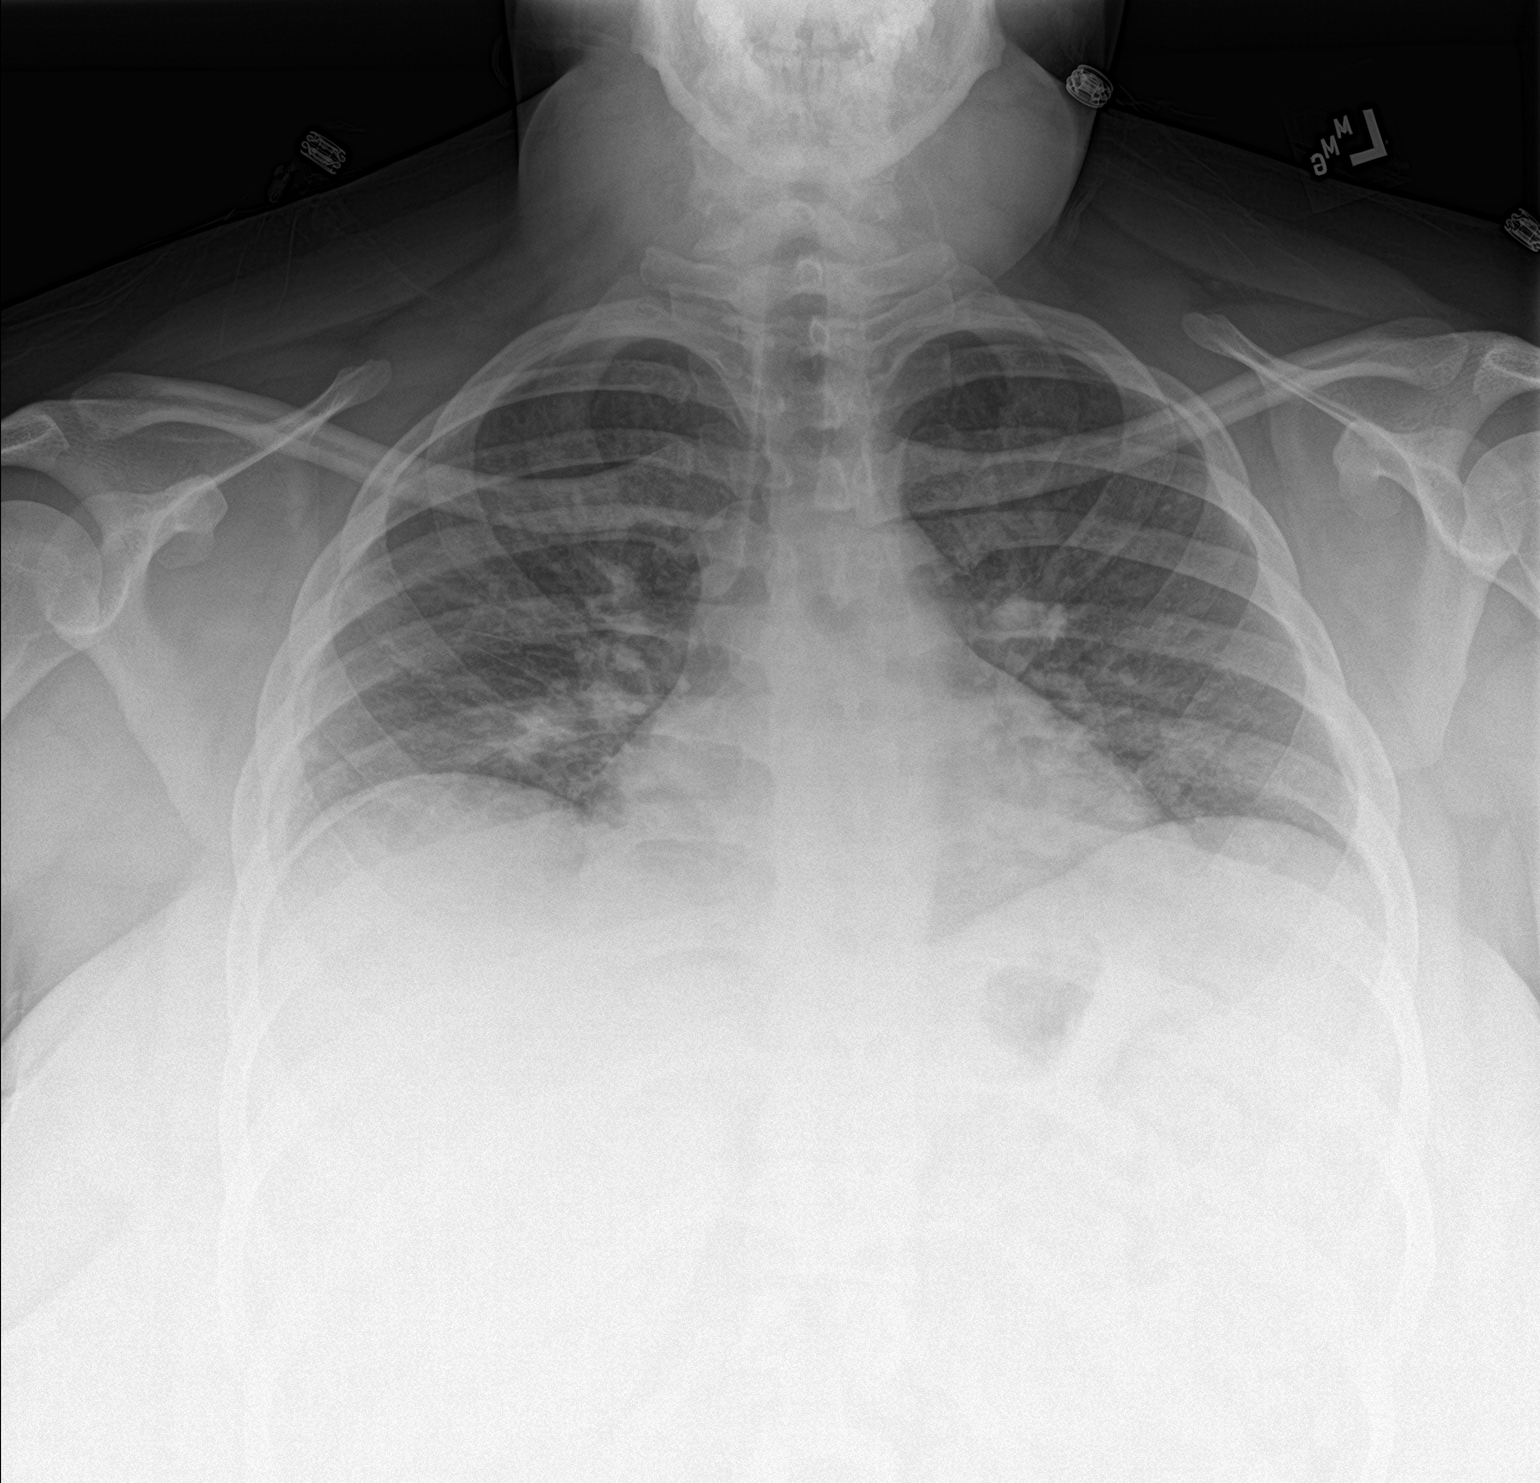

[chest lat]
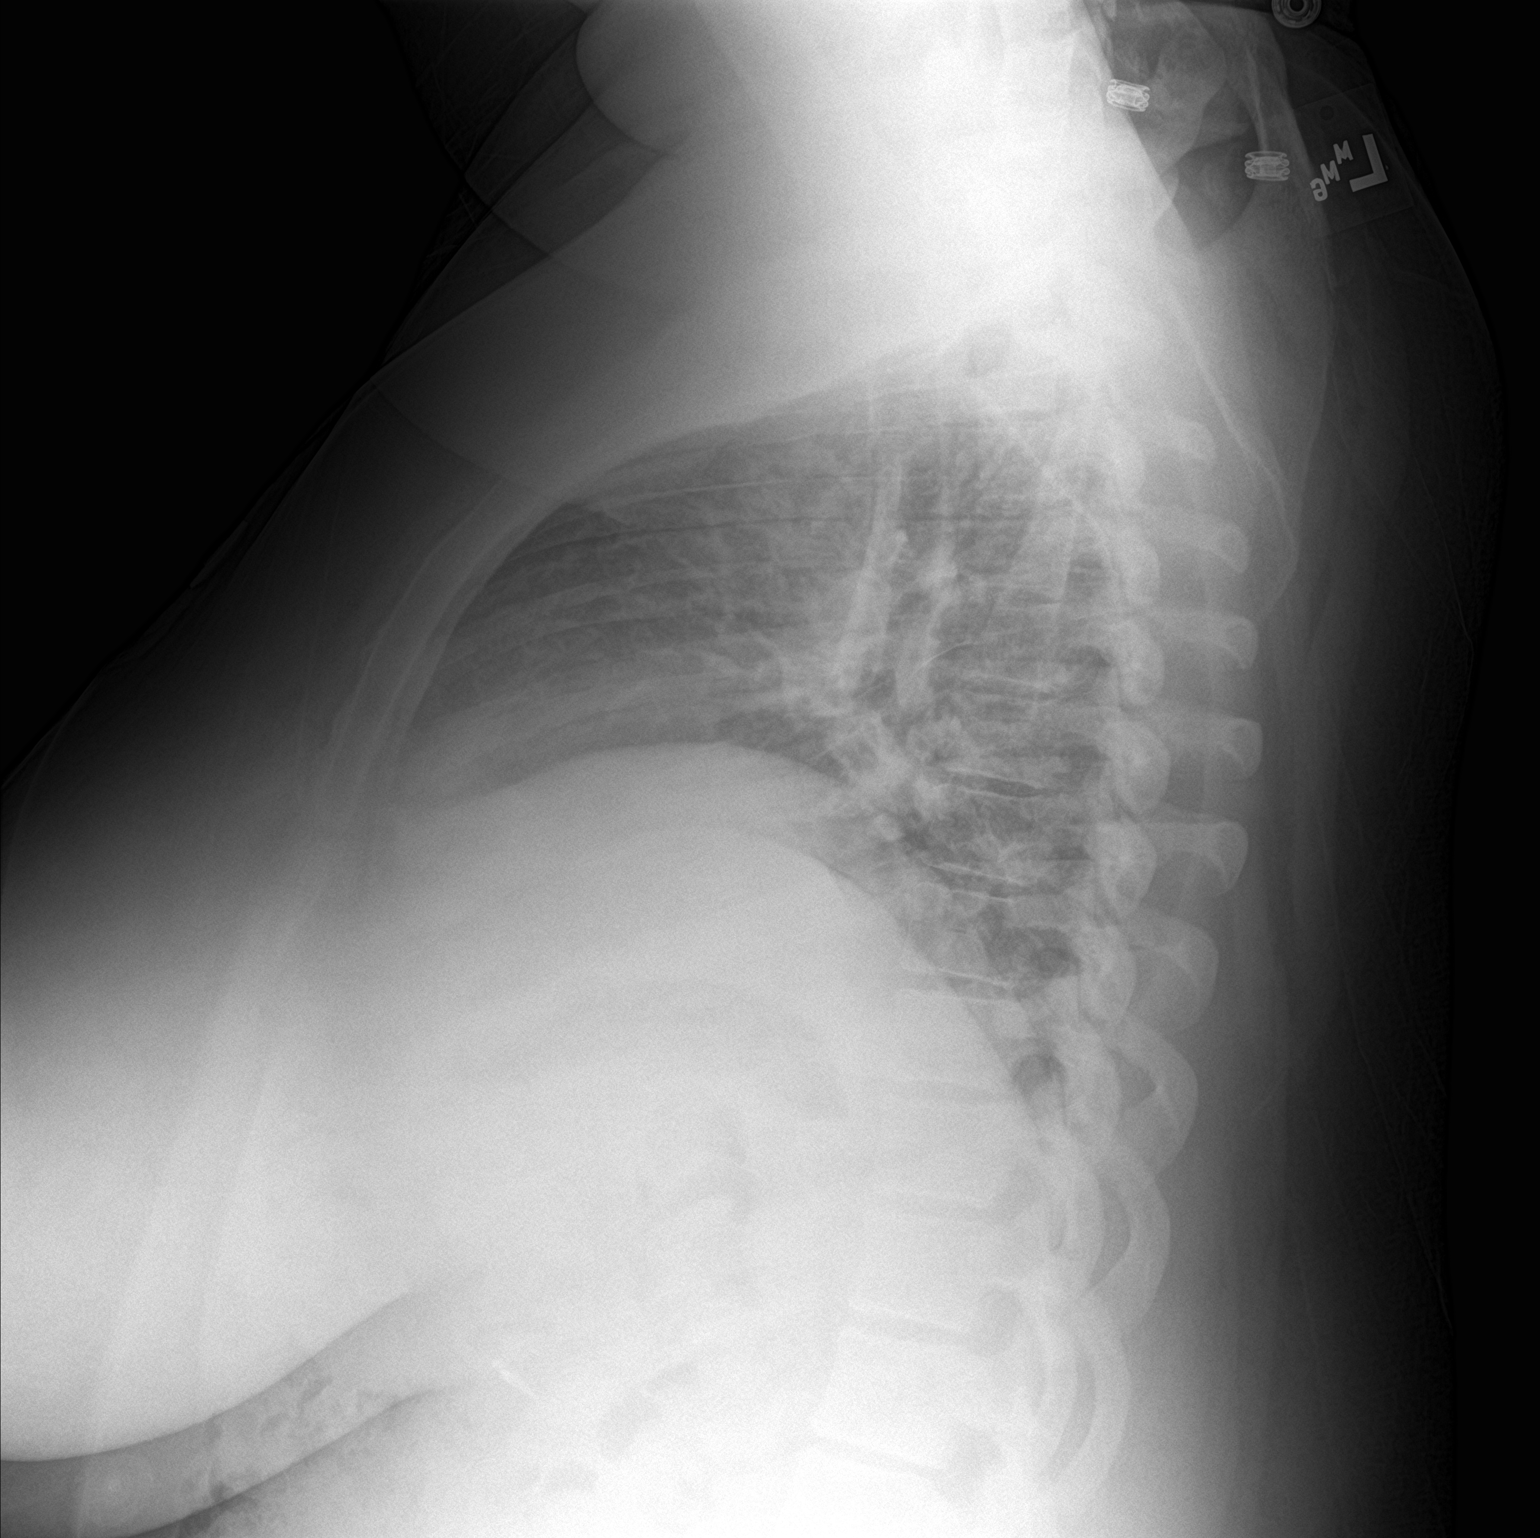

[2 of 2 positions shown; findings below may reference images not displayed]

FINDINGS: Slight patchy opacity in the central right lung may represent
infectious infiltrate, atelectasis, pulmonary infarction. This is
accentuated by a shallow degree of inspiration. The left lung is
clear. There is no pleural effusion. Hilar and mediastinal contours
are unremarkable and unchanged.
IMPRESSION: Patchy airspace opacity in the right lung. Considerations include
infectious infiltrate, atelectasis, pulmonary infarction.

## 2016-03-17 MED ORDER — HYDROCODONE-ACETAMINOPHEN 5-325 MG PO TABS: 1.0000 | ORAL_TABLET | ORAL | 0 refills | Status: DC | PRN

## 2016-03-17 MED ORDER — HEPARIN BOLUS VIA INFUSION
1600.0000 [IU] | Freq: Once | INTRAVENOUS | Status: AC
  Administered 2016-03-17: 1600 [IU] via INTRAVENOUS
  Filled 2016-03-17: qty 1600

## 2016-03-17 MED ORDER — WARFARIN SODIUM 7.5 MG PO TABS: 7.5000 mg | ORAL_TABLET | Freq: Every day | ORAL | 0 refills | Status: DC

## 2016-03-17 MED ORDER — ACETAMINOPHEN 500 MG PO TABS
1000.0000 mg | ORAL_TABLET | Freq: Once | ORAL | Status: AC
  Administered 2016-03-17: 1000 mg via ORAL
  Filled 2016-03-17: qty 2

## 2016-03-17 MED ORDER — OXYCODONE HCL 5 MG PO TABS
5.0000 mg | ORAL_TABLET | Freq: Once | ORAL | Status: AC
  Administered 2016-03-17: 5 mg via ORAL
  Filled 2016-03-17: qty 1

## 2016-03-17 MED ORDER — WARFARIN SODIUM 5 MG PO TABS: 5.0000 mg | ORAL_TABLET | Freq: Once | ORAL | Status: DC

## 2016-03-17 MED ORDER — DIPHENHYDRAMINE HCL 50 MG/ML IJ SOLN
25.0000 mg | Freq: Once | INTRAMUSCULAR | Status: DC
  Filled 2016-03-17: qty 0.5

## 2016-03-17 NOTE — ED Provider Notes (Signed)
[facility] DEPT Provider Note   CSN: [id number] Arrival date & time: 03/17/16  1857     History   Chief Complaint Chief Complaint  Patient presents with  . Chest Pain    HPI Heather Cherry is a 30 y.o. female.  30 year old female with a history of anxiety, depression, hypertension, pseudotumor cerebri, DVT and PE presents to the emergency department for right-sided chest pain. Chest pain has been constant since this afternoon. It is worse with coughing and deep breathing. Patient reports similar pain which was intermittent and worse with deep breathing over the last week. She was discharged today from [facility] after discovery of a right sided pulmonary embolus. Patient diagnosed with DVT 2 weeks ago. She was initially on Xarelto, but was switched to Coumadin during this admission when there was concern for failed management with Xarelto. Patient c/o worsening DOE since discharge. She has had no syncope or vomiting. No fevers. Tachycardic on arrival.   The history is provided by the patient and a parent. No language interpreter was used.  Chest Pain      Past Medical History:  Diagnosis Date  . Abnormal vaginal bleeding   . Anxiety   . Depression   . DVT (deep venous thrombosis) ([place])   . Fibroid   . Hypertension   . Obesity   . Pulmonary emboli ([place])   . Sinus congestion     Patient Active Problem List   Diagnosis Date Noted  . PE (pulmonary thromboembolism) ([place]) 03/12/2016  . DVT (deep venous thrombosis) ([place]) 03/12/2016  . HTN (hypertension) 03/12/2016  . Depression 03/12/2016  . Anxiety 03/12/2016  . GERD (gastroesophageal reflux disease) 03/12/2016  . Hyperglycemia 03/12/2016    Past Surgical History:  Procedure Laterality Date  . CHOLECYSTECTOMY    . WISDOM TOOTH EXTRACTION      OB History    Gravida Para Term Preterm AB Living   0             SAB TAB Ectopic Multiple Live Births                   Home Medications    Prior to Admission  medications   Medication Sig Start Date End Date Taking? Authorizing Provider  acetaZOLAMIDE (DIAMOX) 250 MG tablet Take 500 mg by mouth 2 (two) times daily.    Historical Provider, MD  amLODipine (NORVASC) 5 MG tablet Take 5 mg by mouth daily.    Historical Provider, MD  clonazePAM (KLONOPIN) 0.5 MG tablet Take 0.5 mg by mouth 2 (two) times daily as needed for anxiety.    Historical Provider, MD  ferrous sulfate 325 (65 FE) MG tablet Take 325 mg by mouth 3 (three) times daily with meals.    Historical Provider, MD  gabapentin (NEURONTIN) 300 MG capsule Take 300 mg by mouth 3 (three) times daily.    Historical Provider, MD  hydrochlorothiazide (HYDRODIURIL) 25 MG tablet Take 25 mg by mouth daily.     Historical Provider, MD  HYDROcodone-acetaminophen (NORCO/VICODIN) 5-325 MG tablet Take 1-2 tablets by mouth every 4 (four) hours as needed for moderate pain. 03/17/16   Dustin Flock, MD  metFORMIN (GLUCOPHAGE-XR) 500 MG 24 hr tablet Take 1,000 mg by mouth daily with breakfast.    Historical Provider, MD  omeprazole (PRILOSEC) 20 MG capsule Take 20 mg by mouth daily.    Historical Provider, MD  warfarin (COUMADIN) 7.5 MG tablet Take 1 tablet (7.5 mg total) by mouth daily. 03/17/16   Shreyang Posey Pronto,  MD    Family History Family History  Problem Relation Age of Onset  . Cancer Paternal Grandfather     Social History Social History  Substance Use Topics  . Smoking status: Never Smoker  . Smokeless tobacco: Never Used  . Alcohol use Yes     Comment: occas. social     Allergies   Lisinopril   Review of Systems Review of Systems  Cardiovascular: Positive for chest pain.   Ten systems reviewed and are negative for acute change, except as noted in the HPI.    Physical Exam Updated Vital Signs BP 134/88   Pulse 108   Temp 98.8 F (37.1 C) (Oral)   Resp 16   LMP 02/27/2016   SpO2 99%   Physical Exam  Constitutional: She is oriented to person, place, and time. She appears  well-developed and well-nourished. No distress.  Nontoxic and in NAD  HENT:  Head: Normocephalic and atraumatic.  Eyes: Conjunctivae and EOM are normal. No scleral icterus.  Neck: Normal range of motion.  No JVD  Cardiovascular: Normal rate, regular rhythm and intact distal pulses.   92bpm  Pulmonary/Chest: Effort normal. No respiratory distress. She has no wheezes. She has no rales.  Lungs CTAB. No hypoxia on room air. SpO2 98-100%.  Musculoskeletal: Normal range of motion.  Neurological: She is alert and oriented to person, place, and time. She exhibits normal muscle tone. Coordination normal.  Skin: Skin is warm and dry. No rash noted. She is not diaphoretic. No erythema. No pallor.  Psychiatric: She has a normal mood and affect. Her behavior is normal.  Nursing note and vitals reviewed.    ED Treatments / Results  Labs (all labs ordered are listed, but only abnormal results are displayed) Labs Reviewed  BASIC METABOLIC PANEL - Abnormal; Notable for the following:       Result Value   CO2 20 (*)    Glucose, Bld 119 (*)    All other components within normal limits  CBC - Abnormal; Notable for the following:    Hemoglobin 9.2 (*)    HCT 31.1 (*)    MCV 70.0 (*)    MCH 20.7 (*)    MCHC 29.6 (*)    RDW 21.3 (*)    All other components within normal limits  PROTIME-INR - Abnormal; Notable for the following:    Prothrombin Time 23.7 (*)    All other components within normal limits  I-STAT TROPOININ, ED  I-STAT BETA HCG BLOOD, ED ([facility], [facility], [facility] ONLY)    EKG  EKG Interpretation None       Radiology Dg Chest 2 View  Result Date: 03/17/2016 CLINICAL DATA:  Chest pain and dyspnea for 5 days. EXAM: CHEST  2 VIEW COMPARISON:  03/12/2016 FINDINGS: Slight patchy opacity in the central right lung may represent infectious infiltrate, atelectasis, pulmonary infarction. This is accentuated by a shallow degree of inspiration. The left lung is clear. There is no pleural effusion.  Hilar and mediastinal contours are unremarkable and unchanged. IMPRESSION: Patchy airspace opacity in the right lung. Considerations include infectious infiltrate, atelectasis, pulmonary infarction. Electronically Signed   By: Andreas Newport M.D.   On: 03/17/2016 21:11    Procedures Procedures (including critical care time)  Medications Ordered in ED Medications  acetaminophen (TYLENOL) tablet 1,000 mg (not administered)     Initial Impression / Assessment and Plan / ED Course  I have reviewed the triage vital signs and the nursing notes.  Pertinent labs & imaging  results that were available during my care of the patient were reviewed by me and considered in my medical decision making (see chart for details).  Clinical Course     2100 - Plan to assess pulse ox with ambulation. Location of pain does correlate with PE on CTA. Question worsening clot burden given change to a constant pain; remains pleuritic. CXR findings likely correlate with atelectasis from known PE. Normal troponin.  2130 - Patient ambulated without hypoxia, however, HR in 130's when ambulatory. She complained of dyspnea and feeling like she "was going to die". Patient did have evidence of R heart strain on CT PE study from 03/12/16. Tachycardia is concerning. Plan for observation admission. Warfarin is therapeutic.  2210 - Dr. Hal Hope to admit. Oxycodone and tylenol given for pain.   Final Clinical Impressions(s) / ED Diagnoses   Final diagnoses:  Right pulmonary embolus (HCC)  Tachycardia  Pleuritic chest pain    New Prescriptions New Prescriptions   No medications on file     Antonietta Breach, PA-C 03/17/16 2305    Courteney Lyn Mackuen, MD 03/19/16 2305

## 2016-03-17 NOTE — Care Management (Addendum)
INR today is 2.30.  She is not requiring supplemental 02. Low grade temp 99.2 over last 24 hours and heart rate low 100's.

## 2016-03-17 NOTE — Progress Notes (Signed)
ANTICOAGULATION CONSULT NOTE - FOLLOW UP  Pharmacy Consult for heparin drip Indication: pulmonary embolus  Allergies  Allergen Reactions  . Lisinopril Cough    Patient Measurements: Height: 5\' 9"  (175.3 cm) Weight: (!) 369 lb 3.2 oz (167.5 kg) IBW/kg (Calculated) : 66.2 Heparin Dosing Weight: 106 kg  Vital Signs: Temp: 98.1 F (36.7 C) (12/05 0511) Temp Source: Oral (12/05 0511) BP: 104/35 (12/05 0511) Pulse Rate: 102 (12/05 0511)  Labs:  Recent Labs  03/14/16 0902 03/14/16 1708  03/15/16 0054  03/15/16 0802 03/15/16 1409 03/16/16 0436 03/17/16 0513  HGB  --   --   --   --   < > 9.0*  --  8.7* 9.3*  HCT  --   --   --   --   --  28.5*  --  27.8* 30.3*  PLT  --   --   --   --   --  367  --  324 309  APTT 52* 65*  --  54*  --   --   --   --   --   LABPROT  --   --   --   --   --  16.9*  --  19.5* 25.7*  INR  --   --   --   --   --  1.36  --  1.63 2.30  HEPARINUNFRC  --   --   < > 0.11*  --  0.32 0.47 0.33 0.23*  CREATININE  --   --   --   --   --  0.73  --  0.74  --   < > = values in this interval not displayed.  Estimated Creatinine Clearance: 174.8 mL/min (by C-G formula based on SCr of 0.74 mg/dL).   Medical History: Past Medical History:  Diagnosis Date  . Abnormal vaginal bleeding   . Anxiety   . Depression   . DVT (deep venous thrombosis) ([place])   . Fibroid   . Hypertension   . Obesity   . Sinus congestion     Medications:  Prescriptions Prior to Admission  Medication Sig Dispense Refill Last Dose  . acetaZOLAMIDE (DIAMOX) 250 MG tablet Take 500 mg by mouth 2 (two) times daily.   03/12/2016 at 0830  . amLODipine (NORVASC) 5 MG tablet Take 5 mg by mouth daily.   03/12/2016 at 0830  . clonazePAM (KLONOPIN) 0.5 MG tablet Take 0.5 mg by mouth 2 (two) times daily as needed for anxiety.   prn at prn  . ferrous sulfate 325 (65 FE) MG tablet Take 325 mg by mouth 3 (three) times daily with meals.   03/12/2016 at 1430  . gabapentin (NEURONTIN) 300 MG capsule  Take 300 mg by mouth 3 (three) times daily.   03/12/2016 at 1430  . hydrochlorothiazide (HYDRODIURIL) 25 MG tablet Take 25 mg by mouth daily.    03/12/2016 at 0830  . HYDROcodone-acetaminophen (NORCO/VICODIN) 5-325 MG tablet Take 1-2 tablets by mouth every 4 (four) hours as needed for moderate pain.   Past Week at Unknown time  . metFORMIN (GLUCOPHAGE-XR) 500 MG 24 hr tablet Take 1,000 mg by mouth daily with breakfast.   03/12/2016 at 0830  . omeprazole (PRILOSEC) 20 MG capsule Take 20 mg by mouth daily.   03/12/2016 at 1200  . Rivaroxaban 15 & 20 MG TBPK Take as directed on package: Start with one 15mg  tablet by mouth twice a day with food. On Day 22, switch to one 20mg  tablet  once a day with food. (Patient taking differently: Take 15-20 mg by mouth 2 (two) times daily. Take as directed on package: Start with one 15mg  tablet by mouth twice a day with food. On Day 22, switch to one 20mg  tablet once a day with food.) 51 each 0 03/12/2016 at 0830   Scheduled:  . acetaZOLAMIDE  500 mg Oral BID  . amLODipine  5 mg Oral Daily  . diphenhydrAMINE  25 mg Intravenous Once  . docusate sodium  200 mg Oral BID  . gabapentin  300 mg Oral TID  . hydrochlorothiazide  25 mg Oral Daily  . metFORMIN  1,000 mg Oral Q breakfast  . pantoprazole  40 mg Oral Daily  . polyethylene glycol  17 g Oral Daily  . sodium chloride flush  3 mL Intravenous Q12H  . Warfarin - Pharmacist Dosing Inpatient   Does not apply q1800  . zolpidem  10 mg Oral QHS   Infusions:  . heparin 2,700 Units/hr (03/17/16 0044)    Assessment: Pharmacy consulted to dose and monitor heparin drip in this 30 year old female for a newly diagnosed PE. Patient was recently diagnosed with a DVT and prescribed rivaroxaban 15 mg PO BID x 21 days followed by 20 mg once daily.  Today is day #17 of rivaroxaban BID treatment dose. Patient reports compliance with last dose taken at 0830 this morning. She presents with SOB and is diagnosed with a new PE this  evening. Per MD, this is a rivaroxaban treatment failure.  11/30 Gave 3000 units bolus x 1 (half-bolus as patient took rivaroxaban that am) Started heparin infusion at 1700 units/hr  12/1 03:00 aPTT 53, heparin level 1.13. Increase rate to 1800 units/hr and recheck in 6 hours.  12/1: aPTT 51; Heparin level 0.51. Will increase heparin gtt rate to 1900 units/hr and will recheck heparin level and APTT @ 17:00.   Goal of Therapy:  Goal APTT 66 - 102 s Heparin level 0.3-0.7 units/ml Monitor platelets by anticoagulation protocol: Yes   Plan:  Check anti-Xa level in 6 hours and daily while on heparin Continue to monitor H&H and platelets Need to monitor APTT due to elevated baseline antiXa level  12/1 1830 APTT = 49. Per RN, no disruptions in therapy and rate was running as ordered. Will increase rate to 2000 units/hr and recheck 6 hours from rate change (12/2 0130)  12/2 02:30 aPTT 58, heparin level 0.16. Increase to 2100 units/hr and recheck in 6 hours.  12/2 11:30 aPTT = 52. Increased drip rate to 2200 units/hr. Recheck in 6 hours at 17:30.    12/2 1730 aPTT= 65. Will increase infusion to 2300 units/hr. Recheck in 6 hours.   12/3 01:00 aPTT 54, heparin level 0.11.  Since heparin level appears to continue to coinicide with aPTT as subtherapeutic, will dose by heparin levels alone. 3200 unit bolus and increase to 2700 units/hr. Recheck heparin level in 6 hours. CBC in AM.  12/3 0800 HL = 0.32.  Will recheck HL at 14:00. 12/3 1409 HL = 0.47.  Will recheck HL with am labs. 12/4 0500 HL = 0.33.  Continue current regimen. Recheck heparin level and CBC tomorrow AM.  12/5 0513 HL subtherapeutic, no aPTT. Give 1600 units IV x 1 bolus and increase rate to 2900 units/hr. Will recheck aPTT and HL in 6 hours.  Laural Benes, [facility] Clinical Pharmacist 03/17/2016 6:49 AM

## 2016-03-17 NOTE — Progress Notes (Signed)
[place] at [place] was admitted to the [facility] on 03/12/2016 and Discharged  03/17/2016 and should be excused from work/school   for 10  days starting 03/12/2016 , may return to work/school without any restrictions.  Call Dustin Flock MD with questions.  Dustin Flock M.D on 03/17/2016,at 12:47 PM  [place] at [facility]  [phone]

## 2016-03-17 NOTE — ED Triage Notes (Signed)
Pt sts chest pain on right side and SOB after being discharged today from [facility] for PE

## 2016-03-17 NOTE — ED Notes (Signed)
Repeat BP done to verify actual pressure pt remains on monitor in no distress

## 2016-03-17 NOTE — ED Notes (Signed)
Pt ambulated in hallway with pulse oximetry HR throughout was in the 120's-130's SPO2 remained in the mid 90's pt appeared to hold her breath at times, though pt stated that she ws trying to catch her breath.  Pt remained focused on vital sign readings throughout trial

## 2016-03-17 NOTE — Discharge Summary (Signed)
[place] at [facility], 30 y.o., DOB 1986/04/12, MRN [id number]. Admission date: 03/12/2016 Discharge Date 03/17/2016 Primary MD Suzanna Obey, MD Admitting Physician Lance Coon, MD  Admission Diagnosis  Hypokalemia [E87.6] Other acute pulmonary embolism with acute cor pulmonale ([place]) [I26.09]  Discharge Diagnosis   Principal Problem:   PE (pulmonary thromboembolism) (HCC)   DVT (deep venous thrombosis) (HCC)   HTN (hypertension)   Anxiety   GERD (gastroesophageal reflux disease)   Hyperglycemia   Headaches related to pseudotumor cerebri        [place]  is a 30 y.o. female who presents with Acute onset chest pain and shortness of breath. Patient was diagnosed a couple weeks ago with a DVT and is been on Xarelto since that time. This DVT was likely provoked as the patient was on oral contraception and had also recently taken long trip in her car. Patient was treated with Xarelto however she did not obtain that on time after the diagnosis of DVT but was on it prior to being seen in the ED for chest pain patient was seen in the ER and underwent CT scan of the chest which did confirm pulmonary embolism. Echocardiogram showed right-sided ventricular strain. Therefore she was admitted and started on a heparin drip. Patient's INR was slow to become therapeutic. Since she already had PE while on Xarelto she was switched over to Coumadin. Patient also has iron deficiency anemia and her ferritin was low therefore received iron transfusion. Patient currently feeling better denies any chest pain or shortness of breath           Consults  None  Significant Tests:  See full reports for all details     Dg Chest 2 View  Result Date: 03/12/2016 CLINICAL DATA:  Chest pain under the right breast EXAM: CHEST  2 VIEW COMPARISON:  None. FINDINGS: The heart size and mediastinal contours are within normal limits. Both lungs are  clear. The visualized skeletal structures are unremarkable. Surgical clips in the right upper quadrant. IMPRESSION: No active cardiopulmonary disease. Electronically Signed   By: Donavan Foil M.D.   On: 03/12/2016 18:34   Ct Angio Chest Pe W Or Wo Contrast  Result Date: 03/12/2016 CLINICAL DATA:  Chest pain with inspiration. On blood thinner for RIGHT lower extremity deep vein thrombosis diagnosed February 24, 2016. EXAM: CT ANGIOGRAPHY CHEST WITH CONTRAST TECHNIQUE: Multidetector CT imaging of the chest was performed using the standard protocol during bolus administration of intravenous contrast. Multiplanar CT image reconstructions and MIPs were obtained to evaluate the vascular anatomy. CONTRAST:  75 cc Isovue 370 COMPARISON:  Chest radiograph March 12, 2016 at 1829 hours FINDINGS: Large body habitus results in overall noisy image quality. CARDIOVASCULAR: Suboptimal contrast opacification of the pulmonary artery's (164 Hounsfield units, target is 250 Hounsfield units. Isodense pulmonary artery, aorta and pulmonary veins. Main pulmonary artery is not enlarged. Linear filling defect RIGHT lower lobar pulmonary artery, acute RIGHT lower lobe segmental nonocclusive pulmonary emboli. Probable RIGHT upper lobe nonocclusive subsegmental PE. Heart size is normal, mild RIGHT heart strain (RV/ LV = 1). No pericardial effusions. Thoracic aorta is normal course and caliber, unremarkable. MEDIASTINUM/NODES: No lymphadenopathy by CT size criteria. LUNGS/PLEURA: Tracheobronchial tree is patent, no pneumothorax. No pleural effusions, focal consolidations, pulmonary nodules or masses. Minimal lingular atelectasis. UPPER ABDOMEN: Included view of the abdomen status post cholecystectomy. Mild suspected hepatomegaly. MUSCULOSKELETAL: Visualized soft tissues and included osseous structures appear normal. Scattered Schmorl's nodes.  Review of the MIP images confirms the above findings. IMPRESSION: Technically limited  examination due to bolus timing and habitus. Acute nonocclusive RIGHT segmental to subsegmental pulmonary emboli. CT evidence of right heart strain (RV/LV Ratio = 1) associated with an increased risk of morbidity and mortality. Please activate Code PE by paging [phone]. Critical Value/emergent results were called by telephone at the time of interpretation on 03/12/2016 at 7:15 pm to Dr. Hinda Kehr , who verbally acknowledged these results. Electronically Signed   By: Elon Alas M.D.   On: 03/12/2016 19:16   US Venous Img Lower Unilateral Right  Result Date: 02/24/2016 CLINICAL DATA:  Acute right calf pain for the past 12 hours. Evaluate for DVT. EXAM: RIGHT LOWER EXTREMITY VENOUS DOPPLER ULTRASOUND TECHNIQUE: Gray-scale sonography with graded compression, as well as color Doppler and duplex ultrasound were performed to evaluate the lower extremity deep venous systems from the level of the common femoral vein and including the common femoral, femoral, profunda femoral, popliteal and calf veins including the posterior tibial, peroneal and gastrocnemius veins when visible. The superficial great saphenous vein was also interrogated. Spectral Doppler was utilized to evaluate flow at rest and with distal augmentation maneuvers in the common femoral, femoral and popliteal veins. COMPARISON:  None. FINDINGS: Examination is degraded due to patient body habitus and poor sonographic window. Contralateral Common Femoral Vein: Respiratory phasicity is normal and symmetric with the symptomatic side. No evidence of thrombus. Normal compressibility. Common Femoral Vein: No evidence of thrombus. Normal compressibility, respiratory phasicity and response to augmentation. Saphenofemoral Junction: No evidence of thrombus. Normal compressibility and flow on color Doppler imaging. Profunda Femoral Vein: No evidence of thrombus. Normal compressibility and flow on color Doppler imaging. Femoral Vein: No evidence of  thrombus. Normal compressibility, respiratory phasicity and response to augmentation. Popliteal Vein: There is a mixed echogenic occlusive thrombus within the distal aspect of the right popliteal vein, distal to the confluence of the lesser saphenous vein (image 31). Calf Veins: There is echogenic occlusive thrombus within the right posterior tibial vein (representative images 34 and 35). The peroneal vein is suboptimally imaged. Superficial Great Saphenous Vein: No evidence of thrombus. Normal compressibility and flow on color Doppler imaging. Venous Reflux:  None. Other Findings:  None. 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 Electronically Signed   By: Sandi Mariscal M.D.   On: 02/24/2016 16:56       Today   Subjective:   Heather Cherry feels better denies any chest pain or shortness of breath  Objective:   Blood pressure (!) 111/48, pulse 97, temperature 97.9 F (36.6 C), temperature source Oral, resp. rate 16, height 5\' 9"  (1.753 m), weight (!) 369 lb 3.2 oz (167.5 kg), last menstrual period 02/27/2016, SpO2 96 %.  .  Intake/Output Summary (Last 24 hours) at 03/17/16 1533 Last data filed at 03/17/16 0300  Gross per 24 hour  Intake              216 ml  Output             1500 ml  Net            -1284 ml    Exam VITAL SIGNS: Blood pressure (!) 111/48, pulse 97, temperature 97.9 F (36.6 C), temperature source Oral, resp. rate 16, height 5\' 9"  (1.753 m), weight (!) 369 lb 3.2 oz (167.5 kg), last menstrual  period 02/27/2016, SpO2 96 %.  GENERAL:  30 y.o.-year-old patient lying in the bed with no acute distress.  EYES: Pupils equal, round, reactive to light and accommodation. No scleral icterus. Extraocular muscles intact.  HEENT: Head atraumatic, normocephalic. Oropharynx and nasopharynx clear.  NECK:  Supple, no jugular venous  distention. No thyroid enlargement, no tenderness.  LUNGS: Normal breath sounds bilaterally, no wheezing, rales,rhonchi or crepitation. No use of accessory muscles of respiration.  CARDIOVASCULAR: S1, S2 normal. No murmurs, rubs, or gallops.  ABDOMEN: Soft, nontender, nondistended. Bowel sounds present. No organomegaly or mass.  EXTREMITIES: No pedal edema, cyanosis, or clubbing.  NEUROLOGIC: Cranial nerves II through XII are intact. Muscle strength 5/5 in all extremities. Sensation intact. Gait not checked.  PSYCHIATRIC: The patient is alert and oriented x 3.  SKIN: No obvious rash, lesion, or ulcer.   Data Review     CBC w Diff: Lab Results  Component Value Date   WBC 8.6 03/17/2016   HGB 9.3 (L) 03/17/2016   HCT 30.3 (L) 03/17/2016   PLT 309 03/17/2016   LYMPHOPCT 13 02/24/2016   MONOPCT 6 02/24/2016   EOSPCT 1 02/24/2016   BASOPCT 0 02/24/2016   CMP: Lab Results  Component Value Date   NA 137 03/16/2016   K 3.6 03/16/2016   CL 107 03/16/2016   CO2 23 03/16/2016   BUN 8 03/16/2016   CREATININE 0.74 03/16/2016   PROT 7.5 03/12/2016   ALBUMIN 3.4 (L) 03/12/2016   BILITOT 0.5 03/12/2016   ALKPHOS 66 03/12/2016   AST 17 03/12/2016   ALT 12 (L) 03/12/2016  .  Micro Results No results found for this or any previous visit (from the past 240 hour(s)).      Code Status Orders        Start     Ordered   03/12/16 2245  Full code  Continuous     03/12/16 2244    Code Status History    Date Active Date Inactive Code Status Order ID Comments User Context   This patient has a current code status but no historical code status.          Follow-up Information    Suzanna Obey, MD Follow up in 2 day(s).   Specialty:  Family Medicine Why:  needs INR Check and coumadin monitoring Contact information: [address] [place] [address] [phone]           Discharge Medications     Medication List    STOP taking these medications    Rivaroxaban 15 & 20 MG Tbpk     TAKE these medications   acetaZOLAMIDE 250 MG tablet Commonly known as:  DIAMOX Take 500 mg by mouth 2 (two) times daily.   amLODipine 5 MG tablet Commonly known as:  NORVASC Take 5 mg by mouth daily.   clonazePAM 0.5 MG tablet Commonly known as:  KLONOPIN Take 0.5 mg by mouth 2 (two) times daily as needed for anxiety.   ferrous sulfate 325 (65 FE) MG tablet Take 325 mg by mouth 3 (three) times daily with meals.   gabapentin 300 MG capsule Commonly known as:  NEURONTIN Take 300 mg by mouth 3 (three) times daily.   hydrochlorothiazide 25 MG tablet Commonly known as:  HYDRODIURIL Take 25 mg by mouth daily.   HYDROcodone-acetaminophen 5-325 MG tablet Commonly known as:  NORCO/VICODIN Take 1-2 tablets by mouth every 4 (four) hours as needed for moderate pain.   metFORMIN 500 MG  24 hr tablet Commonly known as:  GLUCOPHAGE-XR Take 1,000 mg by mouth daily with breakfast.   omeprazole 20 MG capsule Commonly known as:  PRILOSEC Take 20 mg by mouth daily.   warfarin 7.5 MG tablet Commonly known as:  COUMADIN Take 1 tablet (7.5 mg total) by mouth daily.            Durable Medical Equipment        Start     Ordered   03/14/16 [phone]  For home use only DME oxygen  Once    Question Answer Comment  Mode or (Route) Nasal cannula   Liters per Minute 2   Frequency Continuous (stationary and portable oxygen unit needed)   Oxygen delivery system Gas      03/14/16 0953         Total Time in preparing paper work, data evaluation and todays exam - 35 minutes  Dustin Flock M.D on 03/17/2016 at 3:33 PM  [facility]   Office  [phone]

## 2016-03-17 NOTE — Progress Notes (Addendum)
ANTICOAGULATION CONSULT NOTE - Follow Up  Pharmacy Consult for warfarin  Indication: Newly diagnosed PE   Allergies  Allergen Reactions  . Lisinopril Cough    Patient Measurements: Height: 5\' 9"  (175.3 cm) Weight: (!) 369 lb 3.2 oz (167.5 kg) IBW/kg (Calculated) : 66.2  Vital Signs: Temp: 97.9 F (36.6 C) (12/05 1151) Temp Source: Oral (12/05 1151) BP: 111/48 (12/05 1151) Pulse Rate: 97 (12/05 1151)  Labs:  Recent Labs  03/14/16 1708  03/15/16 0054  03/15/16 0802 03/15/16 1409 03/16/16 0436 03/17/16 0513  HGB  --   --   --   < > 9.0*  --  8.7* 9.3*  HCT  --   --   --   --  28.5*  --  27.8* 30.3*  PLT  --   --   --   --  367  --  324 309  APTT 65*  --  54*  --   --   --   --   --   LABPROT  --   --   --   --  16.9*  --  19.5* 25.7*  INR  --   --   --   --  1.36  --  1.63 2.30  HEPARINUNFRC  --   < > 0.11*  --  0.32 0.47 0.33 0.23*  CREATININE  --   --   --   --  0.73  --  0.74  --   < > = values in this interval not displayed.  Estimated Creatinine Clearance: 174.8 mL/min (by C-G formula based on SCr of 0.74 mg/dL).   Medical History: Past Medical History:  Diagnosis Date  . Abnormal vaginal bleeding   . Anxiety   . Depression   . DVT (deep venous thrombosis) ([place])   . Fibroid   . Hypertension   . Obesity   . Sinus congestion     Assessment: Pharmacy consulted to dose and monitor warfarin therapy in this 30 year old female who was previously on xarelto therapy. Per patient, she was complaint; however still developed a PE. Patient is currently on heparin gtt and being transitoned to warfarin therapy.             11/30  INR 1.85  12/1    INR 1.06  Warfarin 10mg  12/2    INR 1.01  Warfarin 10mg   12/3    INR 1.36  Warfarin 10 mg 12/4    INR 1.63  Warfarin 10 mg 12/5    INR 2.30           Goal of Therapy:  INR 2-3 Monitor platelets by anticoagulation protocol: Yes   Plan:  INR now therapeutic with large increase in INR. Will order warfarin 5 mg for  tonight as will likely continue to see effects of yesterday's 10 mg dose.  Will recheck PT/INR with am labs.  Hgb/plt count stable from yesterday. Pt will need two consecutive INR's >2 before discontinuing heparin. Discussed with MD who stated ok for pt to dc today.   Rocky Morel, PharmD, BCPS Clinical Pharmacist 03/17/2016 1:06 PM

## 2016-03-17 NOTE — Discharge Instructions (Signed)
[place] at [place]:  Cardiac diet  DISCHARGE CONDITION:  Stable  ACTIVITY:  Activity as tolerated  OXYGEN:  Home Oxygen: No.   Oxygen Delivery: room air  DISCHARGE LOCATION:  home    ADDITIONAL DISCHARGE INSTRUCTION: needs to have INR check this Thursday primary md office, primary md needs to adjust comadin based on INR.    If you experience worsening of your admission symptoms, develop shortness of breath, life threatening emergency, suicidal or homicidal thoughts you must seek medical attention immediately by calling 911 or calling your MD immediately  if symptoms less severe.  You Must read complete instructions/literature along with all the possible adverse reactions/side effects for all the Medicines you take and that have been prescribed to you. Take any new Medicines after you have completely understood and accpet all the possible adverse reactions/side effects.   Please note  You were cared for by a hospitalist during your hospital stay. If you have any questions about your discharge medications or the care you received while you were in the hospital after you are discharged, you can call the unit and asked to speak with the hospitalist on call if the hospitalist that took care of you is not available. Once you are discharged, your primary care physician will handle any further medical issues. Please note that NO REFILLS for any discharge medications will be authorized once you are discharged, as it is imperative that you return to your primary care physician (or establish a relationship with a primary care physician if you do not have one) for your aftercare needs so that they can reassess your need for medications and monitor your lab values.

## 2016-03-17 NOTE — Progress Notes (Signed)
Pt had c/o pain on rt side that was not relieved by dilaudid. Pt says that "the dilaudid wore off almost right after you gave it to me." Pt had vomited earlier about one hour after being given Ambien. Pt c/o that she is having difficulty sleeping because of the constant pain in her side. Dr Marcille Blanco made aware and ordered Benadryl IV to help for sleep Pt refused the benadryl and all pain medications. During rounds at this time pt was asleep.

## 2016-03-17 NOTE — Progress Notes (Signed)
Benadryl IV given as patient has vomited her Ambien

## 2016-03-17 NOTE — Progress Notes (Signed)
Pt discharged to home via wc.  Instructions and rx given to pt.  Questions answered.  No distress.  

## 2016-03-18 ENCOUNTER — Observation Stay (HOSPITAL_COMMUNITY): Payer: BC Managed Care – PPO

## 2016-03-18 ENCOUNTER — Observation Stay (HOSPITAL_BASED_OUTPATIENT_CLINIC_OR_DEPARTMENT_OTHER): Payer: BC Managed Care – PPO

## 2016-03-18 ENCOUNTER — Encounter (HOSPITAL_COMMUNITY): Payer: Self-pay | Admitting: Internal Medicine

## 2016-03-18 DIAGNOSIS — R079 Chest pain, unspecified: Secondary | ICD-10-CM | POA: Diagnosis present

## 2016-03-18 DIAGNOSIS — R0781 Pleurodynia: Secondary | ICD-10-CM | POA: Diagnosis present

## 2016-03-18 DIAGNOSIS — I2699 Other pulmonary embolism without acute cor pulmonale: Secondary | ICD-10-CM | POA: Diagnosis not present

## 2016-03-18 DIAGNOSIS — R918 Other nonspecific abnormal finding of lung field: Secondary | ICD-10-CM | POA: Diagnosis not present

## 2016-03-18 DIAGNOSIS — J90 Pleural effusion, not elsewhere classified: Secondary | ICD-10-CM

## 2016-03-18 LAB — COMPREHENSIVE METABOLIC PANEL
ALBUMIN: 2.9 g/dL — AB (ref 3.5–5.0)
ALK PHOS: 111 U/L (ref 38–126)
ALT: 25 U/L (ref 14–54)
ANION GAP: 8 (ref 5–15)
AST: 24 U/L (ref 15–41)
BUN: 8 mg/dL (ref 6–20)
CALCIUM: 9 mg/dL (ref 8.9–10.3)
CHLORIDE: 106 mmol/L (ref 101–111)
CO2: 25 mmol/L (ref 22–32)
Creatinine, Ser: 0.77 mg/dL (ref 0.44–1.00)
GFR calc Af Amer: >60 mL/min (ref >60)
GFR calc non Af Amer: >60 mL/min (ref >60)
GLUCOSE: 135 mg/dL — AB (ref 65–99)
Potassium: 3.7 mmol/L (ref 3.5–5.1)
SODIUM: 139 mmol/L (ref 135–145)
Total Bilirubin: 0.6 mg/dL (ref 0.3–1.2)
Total Protein: 6.6 g/dL (ref 6.5–8.1)

## 2016-03-18 LAB — CBC WITH DIFFERENTIAL/PLATELET
BASOS PCT: 0 %
Basophils Absolute: 0 10*3/uL (ref 0.0–0.1)
EOS ABS: 0.2 10*3/uL (ref 0.0–0.7)
EOS PCT: 2 %
HEMATOCRIT: 29.5 % — AB (ref 36.0–46.0)
HEMOGLOBIN: 8.9 g/dL — AB (ref 12.0–15.0)
LYMPHS PCT: 25 %
Lymphs Abs: 2 10*3/uL (ref 0.7–4.0)
MCH: 21.4 pg — AB (ref 26.0–34.0)
MCHC: 30.2 g/dL (ref 30.0–36.0)
MCV: 71.1 fL — AB (ref 78.0–100.0)
MONOS PCT: 7 %
Monocytes Absolute: 0.6 10*3/uL (ref 0.1–1.0)
NEUTROS ABS: 5.3 10*3/uL (ref 1.7–7.7)
Neutrophils Relative %: 66 %
Platelets: 266 10*3/uL (ref 150–400)
RBC: 4.15 MIL/uL (ref 3.87–5.11)
RDW: 22.1 % — ABNORMAL HIGH (ref 11.5–15.5)
WBC: 8.1 10*3/uL (ref 4.0–10.5)

## 2016-03-18 LAB — MRSA PCR SCREENING: MRSA BY PCR: NEGATIVE

## 2016-03-18 LAB — ECHOCARDIOGRAM COMPLETE
HEIGHTINCHES: 69 in
WEIGHTICAEL: 5948.8 [oz_av]

## 2016-03-18 LAB — LACTIC ACID, PLASMA: LACTIC ACID, VENOUS: 1 mmol/L (ref 0.5–1.9)

## 2016-03-18 LAB — GLUCOSE, CAPILLARY
GLUCOSE-CAPILLARY: 117 mg/dL — AB (ref 65–99)
Glucose-Capillary: 128 mg/dL — ABNORMAL HIGH (ref 65–99)
Glucose-Capillary: 101 mg/dL — ABNORMAL HIGH (ref 65–99)
Glucose-Capillary: 96 mg/dL (ref 65–99)
Glucose-Capillary: 120 mg/dL — ABNORMAL HIGH (ref 65–99)

## 2016-03-18 LAB — C-REACTIVE PROTEIN: CRP: 6.6 mg/dL — ABNORMAL HIGH (ref <1.0)

## 2016-03-18 LAB — SEDIMENTATION RATE: Sed Rate: 62 mm/hr — ABNORMAL HIGH (ref 0–22)

## 2016-03-18 LAB — TROPONIN I
TROPONIN I: 1.35 ng/mL — AB (ref <0.03)
Troponin I: <0.03 ng/mL (ref <0.03)

## 2016-03-18 LAB — PROTIME-INR
INR: 2.28
Prothrombin Time: 25.5 seconds — ABNORMAL HIGH (ref 11.4–15.2)

## 2016-03-18 IMAGING — CT CT CHEST W/O CM
2 of 3 series · 15 of 36 positions shown, 18 images · non-contrast
Comparison: [DATE] CT

CLINICAL DATA: Severe dyspnea since yesterday. History of DVT.
Diagnostic pulmonary embolus last week.

EXAM:
CT CHEST WITHOUT CONTRAST
TECHNIQUE: Multidetector CT imaging of the chest was performed following the
standard protocol without IV contrast.

[Series 2: chest w/o 2mm st · axial · non-contrast · 0.73mm/px · z∈[+1308,+1520]mm · 12 of 126 slices shown, 15 images]
[im 10/126  mediastinal]
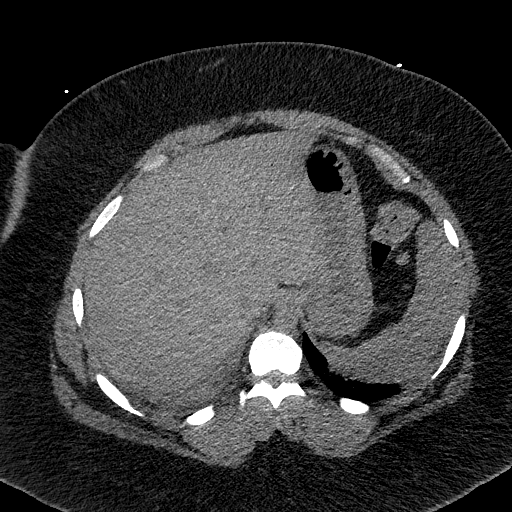
[im 10/126  lung]
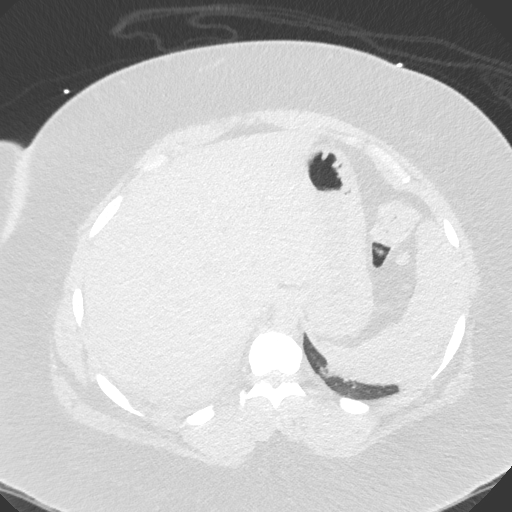
[im 19/126  lung]
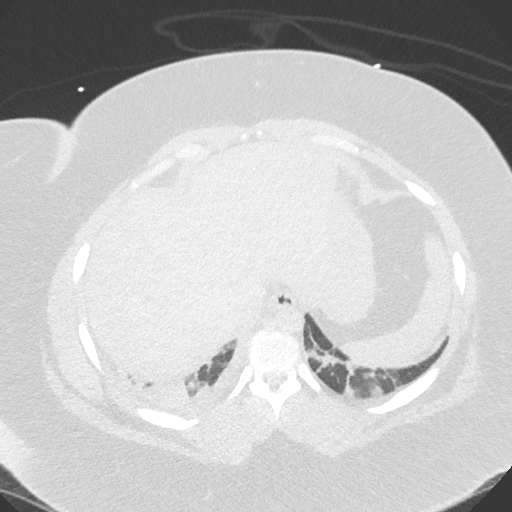
[im 28/126  lung]
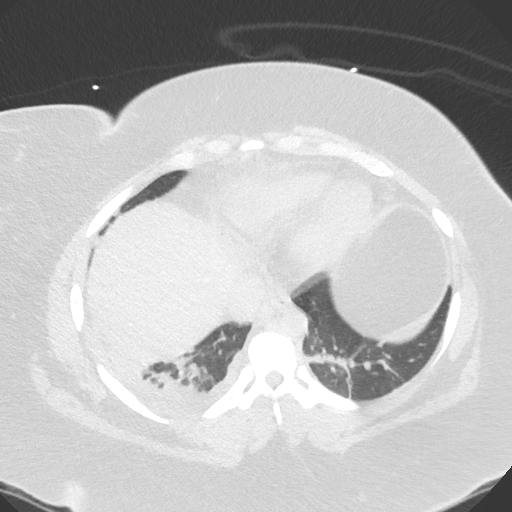
[im 38/126  lung]
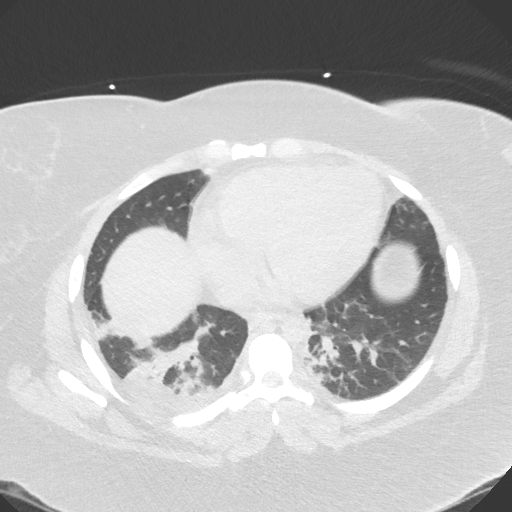
[im 47/126  mediastinal]
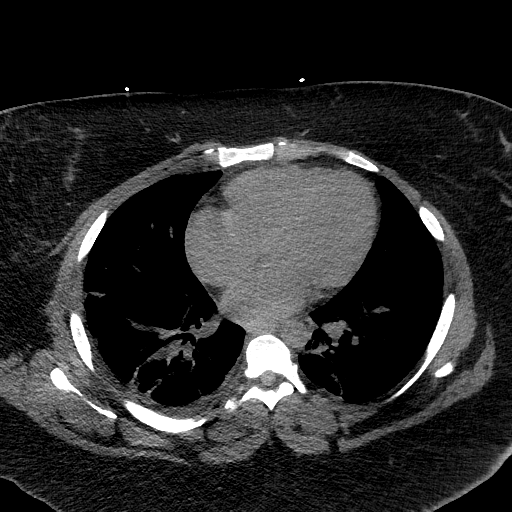
[im 47/126  lung]
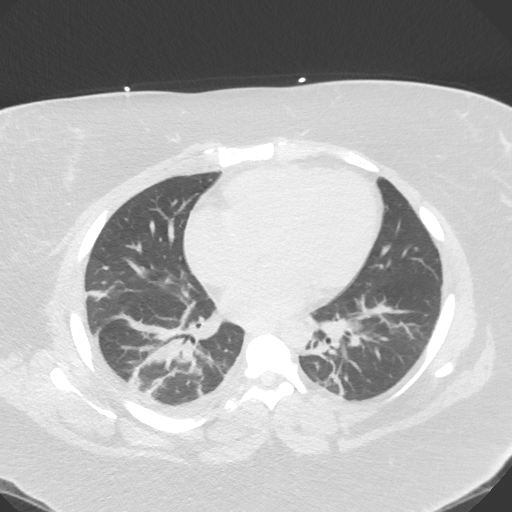
[im 56/126  lung]
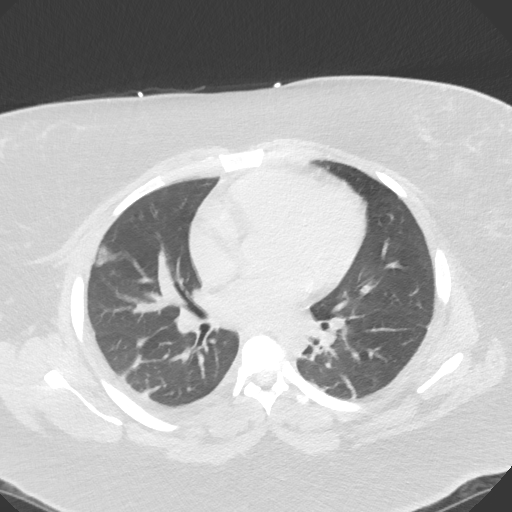
[im 70/126  lung]
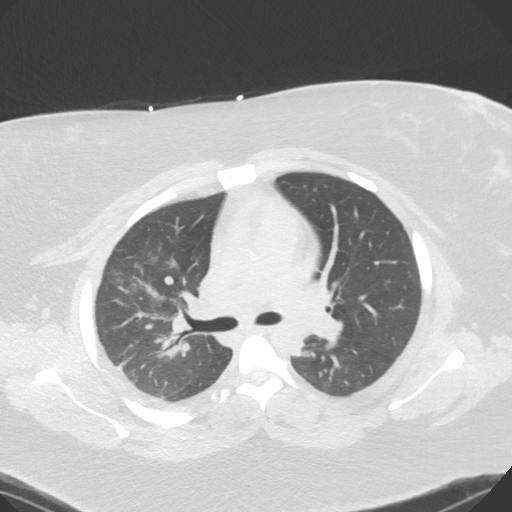
[im 79/126  lung]
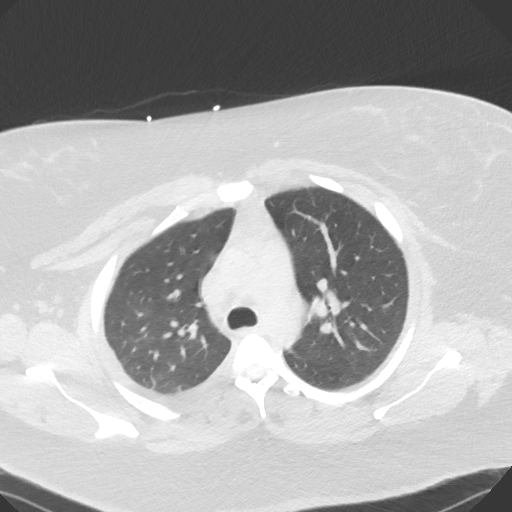
[im 88/126  mediastinal]
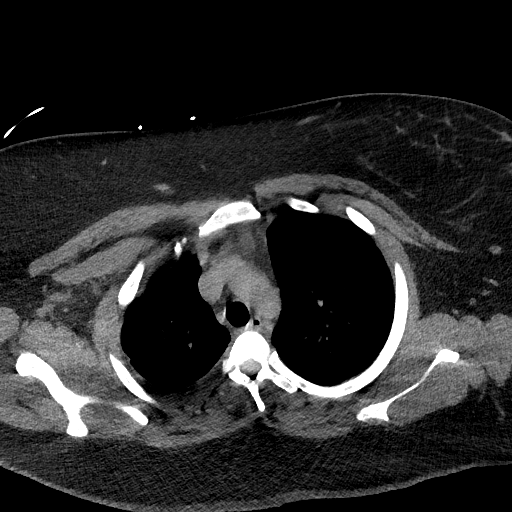
[im 88/126  lung]
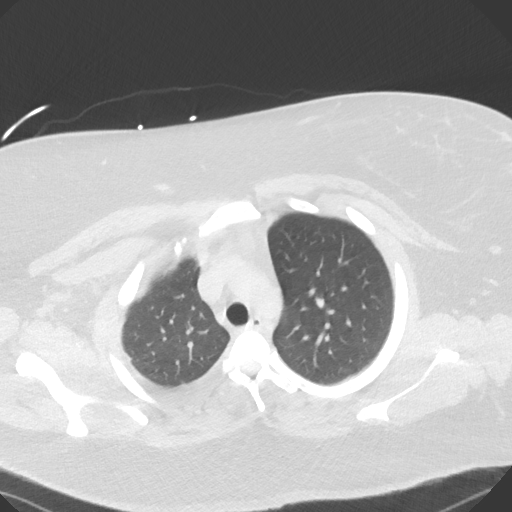
[im 98/126  lung]
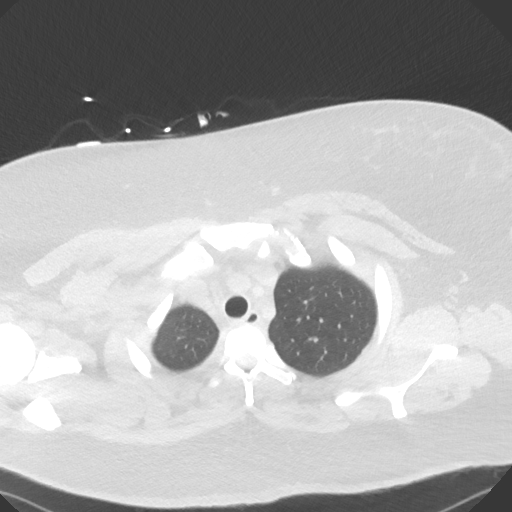
[im 107/126  lung]
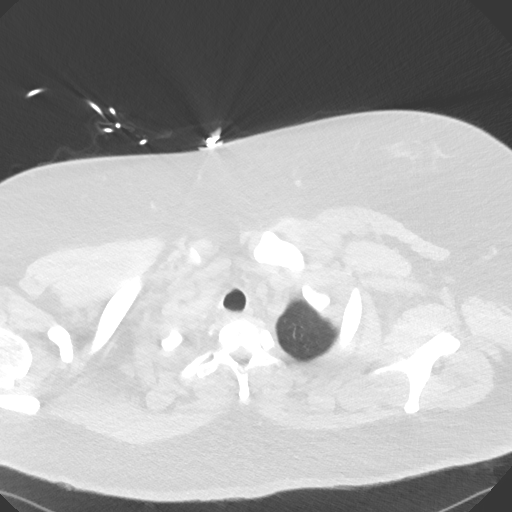
[im 116/126  lung]
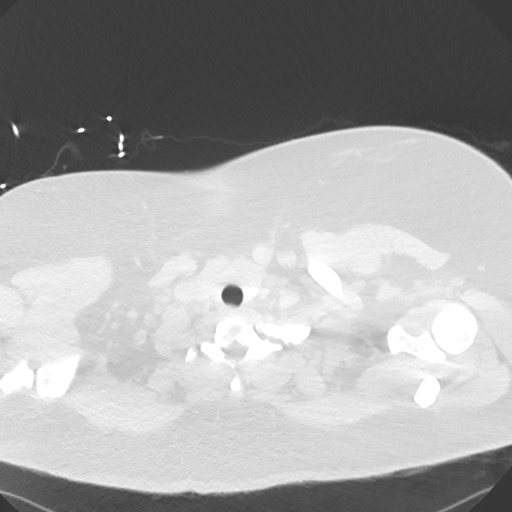

[Series 4: chest w/o 3mm st cor · coronal · non-contrast · 0.51mm/px · 3 of 79 slices shown]
[im 16/79  lung]
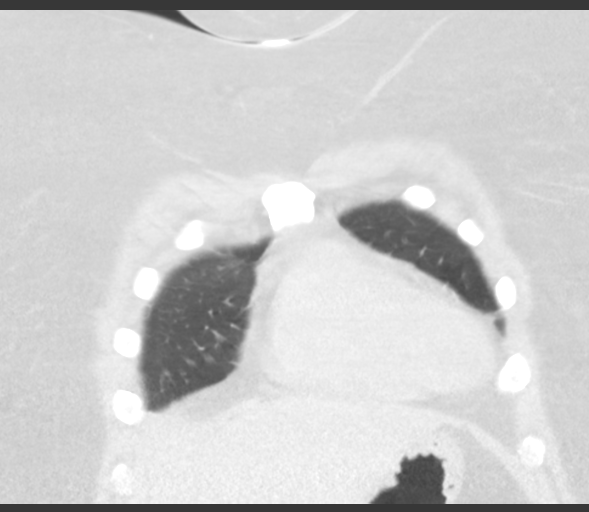
[im 32/79  lung]
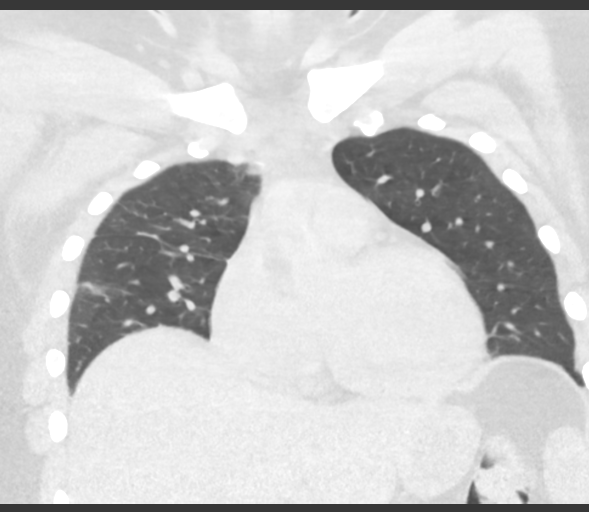
[im 47/79  lung]
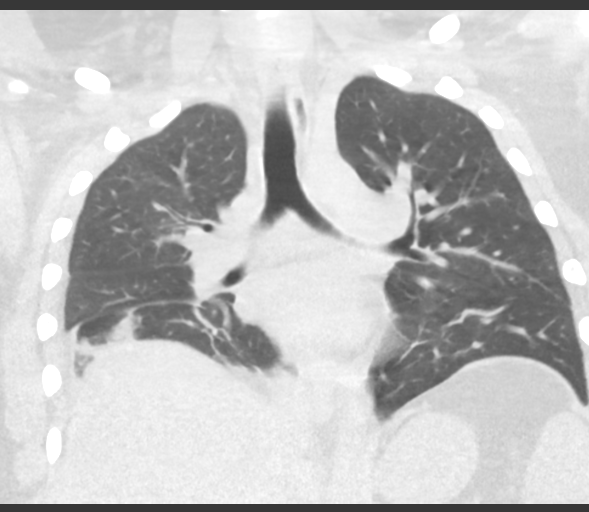

[15 of 36 positions shown; findings below may reference images not displayed]

FINDINGS: Cardiovascular: The heart is top-normal in size. No coronary
arteriosclerosis. Normal takeoff of the great vessels. No
pericardial effusion.

Mediastinum/Nodes: No supraclavicular, axillary nor mediastinal
adenopathy. Nor aortic aneurysm. Visualized trachea and mainstem
bronchi are patent. The esophagus is unremarkable.

Lungs/Pleura: New right upper lobe and right lower lobe pneumonic
consolidations with right middle and bilateral lower lobe
atelectasis. Small right parapneumonic effusion. No pneumothorax.

Upper Abdomen: Negative for acute abnormalities

Musculoskeletal: Negative
IMPRESSION: New right upper and lower lobe pneumonic consolidations with
atelectasis in the right middle lobe and both lower lobes. Small
right parapneumonic effusion.

## 2016-03-18 MED ORDER — INSULIN ASPART 100 UNIT/ML ~~LOC~~ SOLN
0.0000 [IU] | Freq: Three times a day (TID) | SUBCUTANEOUS | Status: DC
  Administered 2016-03-19: 1 [IU] via SUBCUTANEOUS

## 2016-03-18 MED ORDER — ACETAMINOPHEN 650 MG RE SUPP: 650.0000 mg | Freq: Four times a day (QID) | RECTAL | Status: DC | PRN

## 2016-03-18 MED ORDER — ONDANSETRON HCL 4 MG PO TABS: 4.0000 mg | ORAL_TABLET | Freq: Four times a day (QID) | ORAL | Status: DC | PRN

## 2016-03-18 MED ORDER — FERROUS SULFATE 325 (65 FE) MG PO TABS
325.0000 mg | ORAL_TABLET | Freq: Three times a day (TID) | ORAL | Status: DC
  Administered 2016-03-18 – 2016-03-20 (×7): 325 mg via ORAL
  Filled 2016-03-18 (×8): qty 1

## 2016-03-18 MED ORDER — WARFARIN SODIUM 5 MG PO TABS
7.5000 mg | ORAL_TABLET | Freq: Once | ORAL | Status: AC
  Administered 2016-03-18: 7.5 mg via ORAL
  Filled 2016-03-18: qty 2

## 2016-03-18 MED ORDER — CLONAZEPAM 0.5 MG PO TABS
0.5000 mg | ORAL_TABLET | Freq: Two times a day (BID) | ORAL | Status: DC | PRN
  Administered 2016-03-19: 0.5 mg via ORAL
  Filled 2016-03-18: qty 1

## 2016-03-18 MED ORDER — SODIUM CHLORIDE 0.9 % IV SOLN
INTRAVENOUS | Status: DC
  Administered 2016-03-18 (×2): via INTRAVENOUS

## 2016-03-18 MED ORDER — ACETAMINOPHEN 325 MG PO TABS: 650.0000 mg | ORAL_TABLET | Freq: Four times a day (QID) | ORAL | Status: DC | PRN

## 2016-03-18 MED ORDER — GABAPENTIN 300 MG PO CAPS
300.0000 mg | ORAL_CAPSULE | Freq: Three times a day (TID) | ORAL | Status: DC
  Administered 2016-03-18 – 2016-03-20 (×8): 300 mg via ORAL
  Filled 2016-03-18 (×8): qty 1

## 2016-03-18 MED ORDER — ONDANSETRON HCL 4 MG/2ML IJ SOLN: 4.0000 mg | Freq: Four times a day (QID) | INTRAMUSCULAR | Status: DC | PRN

## 2016-03-18 MED ORDER — ACETAZOLAMIDE 250 MG PO TABS
500.0000 mg | ORAL_TABLET | Freq: Two times a day (BID) | ORAL | Status: DC
Start: 2016-03-18 — End: 2016-03-20
  Administered 2016-03-18 – 2016-03-20 (×6): 500 mg via ORAL
  Filled 2016-03-18 (×8): qty 2

## 2016-03-18 MED ORDER — PANTOPRAZOLE SODIUM 40 MG PO TBEC
40.0000 mg | DELAYED_RELEASE_TABLET | Freq: Every day | ORAL | Status: DC
  Administered 2016-03-18 – 2016-03-20 (×3): 40 mg via ORAL
  Filled 2016-03-18 (×3): qty 1

## 2016-03-18 MED ORDER — WARFARIN - PHARMACIST DOSING INPATIENT: Freq: Every day | Status: DC

## 2016-03-18 MED ORDER — OXYCODONE HCL 5 MG PO TABS
5.0000 mg | ORAL_TABLET | ORAL | Status: DC | PRN
  Administered 2016-03-18 – 2016-03-19 (×7): 5 mg via ORAL
  Filled 2016-03-18 (×7): qty 1

## 2016-03-18 MED ORDER — KETOROLAC TROMETHAMINE 30 MG/ML IJ SOLN
30.0000 mg | Freq: Once | INTRAMUSCULAR | Status: AC
  Administered 2016-03-18: 30 mg via INTRAVENOUS
  Filled 2016-03-18 (×2): qty 1

## 2016-03-18 NOTE — Plan of Care (Signed)
Problem: Pain Managment: Goal: General experience of comfort will improve Outcome: Progressing Discussed plan of care and pain management with patient with some teach back displayed

## 2016-03-18 NOTE — Progress Notes (Addendum)
ANTICOAGULATION CONSULT NOTE - Initial Consult  Pharmacy Consult for Warfarin  Indication: pulmonary embolus, DVT  Allergies  Allergen Reactions  . Lisinopril Cough    Patient Measurements: Height: 5\' 9"  (175.3 cm) Weight: (!) 371 lb 12.8 oz (168.6 kg) IBW/kg (Calculated) : 66.2 Vital Signs: Temp: 98.2 F (36.8 C) (12/05 2329) Temp Source: Oral (12/05 2329) BP: 107/46 (12/05 2329) Pulse Rate: 88 (12/05 2300)  Labs:  Recent Labs  03/15/16 0054  03/15/16 0802 03/15/16 1409 03/16/16 0436 03/17/16 0513 03/17/16 1913  HGB  --   < > 9.0*  --  8.7* 9.3* 9.2*  HCT  --   < > 28.5*  --  27.8* 30.3* 31.1*  PLT  --   < > 367  --  324 309 318  APTT 54*  --   --   --   --   --   --   LABPROT  --   < > 16.9*  --  19.5* 25.7* 23.7*  INR  --   < > 1.36  --  1.63 2.30 2.08  HEPARINUNFRC 0.11*  --  0.32 0.47 0.33 0.23*  --   CREATININE  --   --  0.73  --  0.74  --  0.79  < > = values in this interval not displayed.  Estimated Creatinine Clearance: 175.6 mL/min (by C-G formula based on SCr of 0.79 mg/dL).   Medical History: Past Medical History:  Diagnosis Date  . Abnormal vaginal bleeding   . Anxiety   . Depression   . DVT (deep venous thrombosis) ([place])   . Fibroid   . Hypertension   . Obesity   . Pulmonary emboli ([place])   . Sinus congestion      Assessment: 30 y/o F just discharged from [facility], had a DVT and was placed on Xarelto, then found to have PE a few weeks later, Xarelto stopped and started on warfarin. INR is therapeutic at 2.08, Hgb 9.2.    Goal of Therapy:  INR 2-3 Monitor platelets by anticoagulation protocol: Yes   Plan:  -Warfarin 7.5 mg PO x 1 now -Daily PT/INR -Monitor for bleeding -IV heparin if INR becomes sub-therapeutic   Narda Bonds 03/18/2016,12:44 AM

## 2016-03-18 NOTE — Progress Notes (Signed)
  Echocardiogram 2D Echocardiogram has been performed.  Bobbye Charleston 03/18/2016, 9:05 AM

## 2016-03-18 NOTE — H&P (Signed)
History and Physical    Heather Cherry [id number] DOB: October 28, 1985 DOA: 03/17/2016  PCP: Suzanna Obey, MD  Patient coming from: Home.  Chief Complaint: Right-sided chest pain.  HPI: Heather Cherry is a 30 y.o. female with recently diagnosed pulmonary embolism and DVT discharged from [facility] yesterday presents to the ER because of worsening chest pain. Patient was diagnosed with DVT of the lower extremity 2 weeks ago and was on xarelto. Patient started developing right-sided pleuritic chest pain last week and had gone to the ER at [facility] and was diagnosed with right-sided segmental and subsegmental PE with possible strain pattern. 2-D echo done showed -   Impressions:  - The right ventricular systolic pressure was increased consistent   with mild pulmonary hypertension. Normal left ventricular   systolic function with mild diastolic dysfunction and mild   pulmonary hypertension. No clot seen in the right atrium or right   ventricle with mildly dilated right ventricle.   Since patient had developed PE despite being on xarelto patient was started on Coumadin with heparin bridging. Patient presents back to the ER because of worsening pain. Patient states initially her pain was pleuritic in nature but now it has become more constant and is on the right side. Patient in the ER was becoming easily tachycardic on exertion. Patient is being admitted for further management. Patient states her blood pressure has been running low normal now. Denies any dizziness or loss of consciousness.  ED Course: Chest x-ray shows possible infarct in the right side. EKG shows sinus tachycardia.  Review of Systems: As per HPI, rest all negative.   Past Medical History:  Diagnosis Date  . Abnormal vaginal bleeding   . Anxiety   . Depression   . DVT (deep venous thrombosis) ([place])   . Fibroid   . Hypertension   . Obesity   . Pulmonary emboli ([place])   .  Sinus congestion     Past Surgical History:  Procedure Laterality Date  . CHOLECYSTECTOMY    . WISDOM TOOTH EXTRACTION       reports that she has never smoked. She has never used smokeless tobacco. She reports that she drinks alcohol. She reports that she does not use drugs.  Allergies  Allergen Reactions  . Lisinopril Cough    Family History  Problem Relation Age of Onset  . Cancer Paternal Grandfather     Prior to Admission medications   Medication Sig Start Date End Date Taking? Authorizing Provider  acetaZOLAMIDE (DIAMOX) 250 MG tablet Take 500 mg by mouth 2 (two) times daily.   Yes Historical Provider, MD  amLODipine (NORVASC) 5 MG tablet Take 5 mg by mouth daily.   Yes Historical Provider, MD  clonazePAM (KLONOPIN) 0.5 MG tablet Take 0.5 mg by mouth 2 (two) times daily as needed for anxiety.   Yes Historical Provider, MD  ferrous sulfate 325 (65 FE) MG tablet Take 325 mg by mouth 3 (three) times daily with meals.   Yes Historical Provider, MD  gabapentin (NEURONTIN) 300 MG capsule Take 300 mg by mouth 3 (three) times daily.   Yes Historical Provider, MD  hydrochlorothiazide (HYDRODIURIL) 25 MG tablet Take 25 mg by mouth daily.    Yes Historical Provider, MD  HYDROcodone-acetaminophen (NORCO/VICODIN) 5-325 MG tablet Take 1-2 tablets by mouth every 4 (four) hours as needed for moderate pain. 03/17/16  Yes Dustin Flock, MD  metFORMIN (GLUCOPHAGE-XR) 500 MG 24 hr tablet Take 1,000 mg by mouth daily  with breakfast.   Yes Historical Provider, MD  omeprazole (PRILOSEC) 20 MG capsule Take 20 mg by mouth daily.   Yes Historical Provider, MD  warfarin (COUMADIN) 7.5 MG tablet Take 1 tablet (7.5 mg total) by mouth daily. 03/17/16  Yes Dustin Flock, MD    Physical Exam: Vitals:   03/17/16 2230 03/17/16 2245 03/17/16 2300 03/17/16 2329  BP: (!) 106/53 (!) 114/48 (!) 77/54 (!) 107/46  Pulse: 92 90 88   Resp: 19 16 21    Temp:    98.2 F (36.8 C)  TempSrc:    Oral  SpO2: 98% 100%  99%   Weight:    (!) 168.6 kg (371 lb 12.8 oz)  Height:    5\' 9"  (1.753 m)      Constitutional: Obese not in distress. Vitals:   03/17/16 2230 03/17/16 2245 03/17/16 2300 03/17/16 2329  BP: (!) 106/53 (!) 114/48 (!) 77/54 (!) 107/46  Pulse: 92 90 88   Resp: 19 16 21    Temp:    98.2 F (36.8 C)  TempSrc:    Oral  SpO2: 98% 100% 99%   Weight:    (!) 168.6 kg (371 lb 12.8 oz)  Height:    5\' 9"  (1.753 m)   Eyes: Anicteric no pallor. ENMT: No discharge from the ears eyes nose and mouth. Neck: No mass felt. No neck rigidity. Respiratory: No rhonchi or crepitations. Cardiovascular: S1 and S2 heard. No murmurs appreciated. Abdomen: Soft nontender bowel sounds present. No guarding or rigidity. Musculoskeletal: No edema. No joint effusion. Skin: Skin appears warm. No rash. Neurologic: Alert awake oriented to time place and person. Moves all extremities. Psychiatric: Appears normal. Normal affect.   Labs on Admission: I have personally reviewed following labs and imaging studies  CBC:  Recent Labs Lab 03/14/16 0229 03/15/16 0802 03/16/16 0436 03/17/16 0513 03/17/16 1913  WBC 8.0 8.4 8.4 8.6 9.4  HGB 8.9* 9.0* 8.7* 9.3* 9.2*  HCT 28.6* 28.5* 27.8* 30.3* 31.1*  MCV 67.8* 68.1* 67.5* 68.8* 70.0*  PLT 337 367 324 309 0000000   Basic Metabolic Panel:  Recent Labs Lab 03/12/16 1715  03/13/16 1418 03/14/16 0229 03/15/16 0802 03/16/16 0436 03/17/16 1913  NA 137  < > 138 137 134* 137 138  K 2.3*  < > 3.5 3.8 3.4* 3.6 3.9  CL 102  < > 108 109 105 107 106  CO2 26  < > 24 23 23 23  20*  GLUCOSE 123*  < > 129* 139* 129* 130* 119*  BUN 10  < > 9 10 9 8 7   CREATININE 0.77  < > 0.69 0.70 0.73 0.74 0.79  CALCIUM 9.1  < > 8.4* 8.4* 8.8* 8.8* 9.3  MG 1.8  --   --  2.2  --   --   --   < > = values in this interval not displayed. GFR: Estimated Creatinine Clearance: 175.6 mL/min (by C-G formula based on SCr of 0.79 mg/dL). Liver Function Tests:  Recent Labs Lab 03/12/16 1715    AST 17  ALT 12*  ALKPHOS 66  BILITOT 0.5  PROT 7.5  ALBUMIN 3.4*   No results for input(s): LIPASE, AMYLASE in the last 168 hours. No results for input(s): AMMONIA in the last 168 hours. Coagulation Profile:  Recent Labs Lab 03/14/16 0229 03/15/16 0802 03/16/16 0436 03/17/16 0513 03/17/16 1913  INR 1.01 1.36 1.63 2.30 2.08   Cardiac Enzymes:  Recent Labs Lab 03/12/16 1715  TROPONINI <0.03   BNP (last 3  results) No results for input(s): PROBNP in the last 8760 hours. HbA1C: No results for input(s): HGBA1C in the last 72 hours. CBG: No results for input(s): GLUCAP in the last 168 hours. Lipid Profile: No results for input(s): CHOL, HDL, LDLCALC, TRIG, CHOLHDL, LDLDIRECT in the last 72 hours. Thyroid Function Tests: No results for input(s): TSH, T4TOTAL, FREET4, T3FREE, THYROIDAB in the last 72 hours. Anemia Panel: No results for input(s): VITAMINB12, FOLATE, FERRITIN, TIBC, IRON, RETICCTPCT in the last 72 hours. Urine analysis:    Component Value Date/Time   COLORURINE YELLOW 02/21/2013 1700   APPEARANCEUR CLEAR 02/21/2013 1700   LABSPEC 1.020 02/21/2013 1700   PHURINE 6.0 02/21/2013 1700   GLUCOSEU NEGATIVE 02/21/2013 1700   HGBUR LARGE (A) 02/21/2013 1700   BILIRUBINUR NEGATIVE 02/21/2013 1700   KETONESUR 15 (A) 02/21/2013 1700   PROTEINUR 30 (A) 02/21/2013 1700   UROBILINOGEN 0.2 02/21/2013 1700   NITRITE NEGATIVE 02/21/2013 1700   LEUKOCYTESUR NEGATIVE 02/21/2013 1700   Sepsis Labs: @LABRCNTIP (procalcitonin:4,lacticidven:4) )No results found for this or any previous visit (from the past 240 hour(s)).   Radiological Exams on Admission: Dg Chest 2 View  Result Date: 03/17/2016 CLINICAL DATA:  Chest pain and dyspnea for 5 days. EXAM: CHEST  2 VIEW COMPARISON:  03/12/2016 FINDINGS: Slight patchy opacity in the central right lung may represent infectious infiltrate, atelectasis, pulmonary infarction. This is accentuated by a shallow degree of inspiration.  The left lung is clear. There is no pleural effusion. Hilar and mediastinal contours are unremarkable and unchanged. IMPRESSION: Patchy airspace opacity in the right lung. Considerations include infectious infiltrate, atelectasis, pulmonary infarction. Electronically Signed   By: Andreas Newport M.D.   On: 03/17/2016 21:11    EKG: Independently reviewed. Sinus tachycardia.  Assessment/Plan Principal Problem:   Pleuritic chest pain Active Problems:   HTN (hypertension)   Right pulmonary embolus (HCC)   Chest pain    1. Right-sided chest pain with recently diagnosed PE - patient's pain is most likely from infarct. However patient is still tachycardic and has low normal blood pressure with recent 2-D echo showing pulmonary hypertension. I have consulted pulmonary critical care. We will cycle cardiac markers and pulmonary has recommended repeat 2-D echo. Patient is on Coumadin per pharmacy. One dose of Toradol has been ordered and patient is placed on oxycodone for pain. Will also place patient on gentle hydration and hold hydrochlorothiazide. 2. Hypertension - since patient's blood pressure is in the low-normal will hold antihypertensives. 3. Diabetes mellitus type 2 - on sliding-scale coverage. Will hold metformin while inpatient. 4. History of pseudotumor cerebri on Diamox. 5. Anemia - had received iron infusion last week.   DVT prophylaxis: Coumadin. Code Status: Full code.  Family Communication: Discussed with patient.  Disposition Plan: Home.  Consults called: Pulmonary critical care.  Admission status: Observation.    Rise Patience MD [facility] Pager [phone].  If 7PM-7AM, please contact night-coverage [email or web address] Password [email or web address]  03/18/2016, 12:41 AM

## 2016-03-18 NOTE — Progress Notes (Signed)
CRITICAL VALUE ALERT  Critical value received:  Troponin 1.35  Date of notification: 03/18/16  Time of notification:  02:36  Critical value read back: yes  Nurse who received alert:  Malen Gauze  MD notified (1st page):  Raliegh Ip Schorr  Time of first page:  02:51  MD notified (2nd page):  Time of second page:  Responding MD:  03:00  Time MD responded:  K.Schorr

## 2016-03-18 NOTE — Consult Note (Signed)
Name: Heather Cherry MRN: [id number] DOB: 04/06/1986    ADMISSION DATE:  03/17/2016 CONSULTATION DATE:  12/6  REFERRING MD :  Hal Hope   CHIEF COMPLAINT:  Right sided chest pain   BRIEF PATIENT DESCRIPTION:  30 year old w/ recent dx of DVT then PE on Xarelto. Just discharged 12/5 but returned to the ER on 12/6 w/ CC right sided CP and worsening shortness of breath.   SIGNIFICANT EVENTS  12/5 discharged 12/6 readmitted.    STUDIES:   11/13: The examination is positive for age-indeterminate (potentially chronic) occlusive DVT within distal aspect of the popliteal vein extending to involve (at least) the right posterior tibial vein. 11/30: CT chest: Acute nonocclusive RIGHT segmental to subsegmental pulmonary emboli. CT evidence of right heart strain (RV/LV Ratio = 1) associated with an increased risk of morbidity and mortality 12/1 ECHO: - The right ventricular systolic pressure was increased consistent with mild pulmonary hypertension. Normal left ventricular   systolic function with mild diastolic dysfunction and mild   pulmonary hypertension. No clot seen in the right atrium or right   ventricle with mildly dilated right ventricle. 12/6: ECHO>>> 12/6: CT (non-contrast)>>> 12/6 LE dopplers>>> 12/6: CRP>>> 12/6 ESR>>>  HISTORY OF PRESENT ILLNESS:     30 y.o. female with recently diagnosed pulmonary embolism and DVT discharged from [facility] 12/5 presents to the ER because of worsening chest pain 12/6. Patient was diagnosed with DVT of the lower extremity 2 weeks ago and was on xarelto. Patient started developing right-sided pleuritic chest pain last week and had gone to the ER at [facility] and was diagnosed with right-sided segmental and subsegmental PE with possible strain pattern. She was admitted, treated w/ IV heparin and coumadin bridge. Eventually discharged to home. Had improved to the point where she was ambulating in hall  and feeling better. While walking up stairs developed sudden severe right sided chest pain and associated shortness of breath. Called her mother telling her "she was going to die" and felt like she would pass out. Because of this she reported to the ER.   PAST MEDICAL HISTORY :   has a past medical history of Abnormal vaginal bleeding; Anxiety; Depression; DVT (deep venous thrombosis) ([place]); Fibroid; Hypertension; Obesity; Pulmonary emboli ([place]); and Sinus congestion.  has a past surgical history that includes Wisdom tooth extraction and Cholecystectomy. Prior to Admission medications   Medication Sig Start Date End Date Taking? Authorizing Provider  acetaZOLAMIDE (DIAMOX) 250 MG tablet Take 500 mg by mouth 2 (two) times daily.   Yes Historical Provider, MD  amLODipine (NORVASC) 5 MG tablet Take 5 mg by mouth daily.   Yes Historical Provider, MD  clonazePAM (KLONOPIN) 0.5 MG tablet Take 0.5 mg by mouth 2 (two) times daily as needed for anxiety.   Yes Historical Provider, MD  ferrous sulfate 325 (65 FE) MG tablet Take 325 mg by mouth 3 (three) times daily with meals.   Yes Historical Provider, MD  gabapentin (NEURONTIN) 300 MG capsule Take 300 mg by mouth 3 (three) times daily.   Yes Historical Provider, MD  hydrochlorothiazide (HYDRODIURIL) 25 MG tablet Take 25 mg by mouth daily.    Yes Historical Provider, MD  HYDROcodone-acetaminophen (NORCO/VICODIN) 5-325 MG tablet Take 1-2 tablets by mouth every 4 (four) hours as needed for moderate pain. 03/17/16  Yes Dustin Flock, MD  metFORMIN (GLUCOPHAGE-XR) 500 MG 24 hr tablet Take 1,000 mg by mouth daily with breakfast.   Yes Historical Provider, MD  omeprazole (PRILOSEC) 20 MG capsule Take 20 mg by mouth daily.   Yes Historical Provider, MD  warfarin (COUMADIN) 7.5 MG tablet Take 1 tablet (7.5 mg total) by mouth daily. 03/17/16  Yes Dustin Flock, MD   Allergies  Allergen Reactions  . Lisinopril Cough    FAMILY HISTORY:  family history includes  Cancer in her paternal grandfather. SOCIAL HISTORY:  reports that she has never smoked. She has never used smokeless tobacco. She reports that she drinks alcohol. She reports that she does not use drugs.  REVIEW OF SYSTEMS:   Constitutional: Negative for fever, chills, weight loss, malaise/fatigue and diaphoresis.  HENT: Negative for hearing loss, ear pain, nosebleeds, congestion, sore throat, neck pain, tinnitus and ear discharge.   Eyes: Negative for blurred vision, double vision, photophobia, pain, discharge and redness.  Respiratory: Negative for cough, hemoptysis, sputum production chronic, + shortness of breath, wheezing and stridor.  marked chest pain all over chest. Worse w/ deep breath and palp  Cardiovascular: Negative for chest pain, palpitations, orthopnea, claudication, leg swelling and PND.  Gastrointestinal: Negative for heartburn, nausea, vomiting, abdominal pain, diarrhea, constipation, blood in stool and melena.  Genitourinary: Negative for dysuria, urgency, frequency, hematuria and flank pain.  Musculoskeletal: Negative for myalgias, back pain, joint pain and falls.  Skin: Negative for itching and rash.  Neurological: Negative for dizziness, tingling, tremors, sensory change, speech change, focal weakness, seizures, loss of consciousness, weakness and headaches.  Endo/Heme/Allergies: Negative for environmental allergies and polydipsia. Does not bruise/bleed easily.  SUBJECTIVE:  Chest hurts when ever she moves  VITAL SIGNS: Temp:  [97.9 F (36.6 C)-98.8 F (37.1 C)] 97.9 F (36.6 C) (12/06 0800) Pulse Rate:  [87-114] 92 (12/06 0800) Resp:  [15-24] 23 (12/06 0800) BP: (77-137)/(45-105) 108/54 (12/06 0800) SpO2:  [95 %-100 %] 95 % (12/06 0800) Weight:  [371 lb 12.8 oz (168.6 kg)] 371 lb 12.8 oz (168.6 kg) (12/05 2329)  PHYSICAL EXAMINATION: General:  30 year old obese female, awake, alert. No focal def  Neuro:  Awake, oriented. No focal motor def  HEENT:  Neck is  large. No appreciable JVD  Cardiovascular:  rrr w/out MRG Lungs:  Clear/shallow but no accessory use  Abdomen:  Soft, obese + bowel sounds  Musculoskeletal:  Equal st and bulk  Skin:  Warm and dry    Recent Labs Lab 03/16/16 0436 03/17/16 1913 03/18/16 0724  NA 137 138 139  K 3.6 3.9 3.7  CL 107 106 106  CO2 23 20* 25  BUN _0 CREATININE 0.74 0.79 0.77  GLUCOSE 130* 119* 135*    Recent Labs Lab 03/17/16 0513 03/17/16 1913 03/18/16 0724  HGB 9.3* 9.2* 8.9*  HCT 30.3* 31.1* 29.5*  WBC 8.6 9.4 8.1  PLT 309 318 266   Dg Chest 2 View  Result Date: 03/17/2016 CLINICAL DATA:  Chest pain and dyspnea for 5 days. EXAM: CHEST  2 VIEW COMPARISON:  03/12/2016 FINDINGS: Slight patchy opacity in the central right lung may represent infectious infiltrate, atelectasis, pulmonary infarction. This is accentuated by a shallow degree of inspiration. The left lung is clear. There is no pleural effusion. Hilar and mediastinal contours are unremarkable and unchanged. IMPRESSION: Patchy airspace opacity in the right lung. Considerations include infectious infiltrate, atelectasis, pulmonary infarction. Electronically Signed   By: Andreas Newport M.D.   On: 03/17/2016 21:11   RLL airspace disease.   ASSESSMENT / PLAN:  PE/DVT w/ xarelto failure  Right sided pleuritic Chest pain  Patchy right  lower lobe airspace disease-->favor infarct   CEs are negative. Pain after PE is not unusual. Certainly not unusual to have discomfort.  She is hemodynamically stable and it seems as though her symptoms are out of proportion to her physical exam. This may all be from infarct.   Plan Cont coumadin  PRN analgesia  Will f/u ECHO and LE doppers Ck ESR and CRP  Get CT (non-contrast) to eval for infiltrate and pleural involvement  Watch fever and WBC curve    E  ACNP-BC [place] [facility] Pager # [phone] OR # [phone] if no answer  03/18/2016, 11:25 AM  

## 2016-03-18 NOTE — Progress Notes (Signed)
Pt seen and examined, admitted earlier this am by Dr.Kakrakandy 30/F just discharged from [facility] yesterday She was diagnosed with a DVT of RLE on 11/13 and started on Xarelto then. Then was admitted to [facility] from 11/30 to 12/5 with Acute PE and riht heart strain, this was felt to be a xarelto failure and hence was transitioned to warfarin and discharged yesterday on Coumadin. She started having chest pain 2days ago and got worse after she went home and came back to our ED with worsening chest pain. CXR concerning for pulm infarct and EKG with sinus tachycardia. INR therapeutic, continue Coumadin Repeat ECHO done this am will FU PCCM consulted by Dr.Kakrakandy, await input Supportive care for now, oxycodone PRN  Domenic Polite, MD

## 2016-03-18 NOTE — Progress Notes (Signed)
Pt arrived to 4e21 from the ED with RN Ubaldo Glassing, pts mother, and friend. VSS on Room air (b/p 107/46 HR 101 Sp02 99%). Pt A&0x4. Pt belongings left at bedside included pants, slipper shoes, and cell phone. CHG bath completed and MD notified of pt's arrival to unit.

## 2016-03-18 NOTE — Progress Notes (Signed)
[place] for Warfarin  Indication: pulmonary embolus, DVT  Allergies  Allergen Reactions  . Lisinopril Cough    Patient Measurements: Height: 5\' 9"  (175.3 cm) Weight: (!) 371 lb 12.8 oz (168.6 kg) IBW/kg (Calculated) : 66.2 Vital Signs: Temp: 98 F (36.7 C) (12/06 0326) Temp Source: Oral (12/06 0326) BP: 109/59 (12/06 0326) Pulse Rate: 95 (12/06 0600)  Labs:  Recent Labs  03/15/16 1409  03/16/16 0436 03/17/16 0513 03/17/16 1913 03/18/16 0135 03/18/16 0724  HGB  --   < > 8.7* 9.3* 9.2*  --  8.9*  HCT  --   < > 27.8* 30.3* 31.1*  --  29.5*  PLT  --   < > 324 309 318  --  266  LABPROT  --   < > 19.5* 25.7* 23.7*  --  25.5*  INR  --   < > 1.63 2.30 2.08  --  2.28  HEPARINUNFRC 0.47  --  0.33 0.23*  --   --   --   CREATININE  --   --  0.74  --  0.79  --   --   TROPONINI  --   --   --   --   --  1.35*  --   < > = values in this interval not displayed.  Estimated Creatinine Clearance: 175.6 mL/min (by C-G formula based on SCr of 0.79 mg/dL).   Medical History: Past Medical History:  Diagnosis Date  . Abnormal vaginal bleeding   . Anxiety   . Depression   . DVT (deep venous thrombosis) ([place])   . Fibroid   . Hypertension   . Obesity   . Pulmonary emboli ([place])   . Sinus congestion      Assessment: 30 y/o F recently transitioned to warfarin due to development of PE while on xarelto that was admitted to [facility] from 11/30 to 12/5 for management of PE and heparin to warfarin bridge that presents to ED with worsening R sided chest pain.  INR currently therapeutic at 2.28 and CBC stable.   Goal of Therapy:  INR 2-3 Monitor platelets by anticoagulation protocol: Yes   Plan:  -Warfarin 7.5 mg PO x 1 this evening  -Daily PT/INR -Monitor for bleeding -IV heparin if INR becomes sub-therapeutic   Vincenza Hews, PharmD, BCPS 03/18/2016, 8:28 AM Pager: [phone]

## 2016-03-19 ENCOUNTER — Observation Stay (HOSPITAL_BASED_OUTPATIENT_CLINIC_OR_DEPARTMENT_OTHER): Payer: BC Managed Care – PPO

## 2016-03-19 DIAGNOSIS — R0781 Pleurodynia: Secondary | ICD-10-CM

## 2016-03-19 DIAGNOSIS — G932 Benign intracranial hypertension: Secondary | ICD-10-CM | POA: Diagnosis present

## 2016-03-19 DIAGNOSIS — J90 Pleural effusion, not elsewhere classified: Secondary | ICD-10-CM | POA: Diagnosis not present

## 2016-03-19 DIAGNOSIS — I2699 Other pulmonary embolism without acute cor pulmonale: Secondary | ICD-10-CM | POA: Diagnosis not present

## 2016-03-19 DIAGNOSIS — R52 Pain, unspecified: Secondary | ICD-10-CM

## 2016-03-19 DIAGNOSIS — R609 Edema, unspecified: Secondary | ICD-10-CM | POA: Diagnosis not present

## 2016-03-19 LAB — PROTIME-INR
INR: 3.12
PROTHROMBIN TIME: 32.8 s — AB (ref 11.4–15.2)

## 2016-03-19 LAB — BASIC METABOLIC PANEL
Anion gap: 6 (ref 5–15)
BUN: 7 mg/dL (ref 6–20)
CALCIUM: 8.4 mg/dL — AB (ref 8.9–10.3)
CO2: 21 mmol/L — ABNORMAL LOW (ref 22–32)
CREATININE: 0.77 mg/dL (ref 0.44–1.00)
Chloride: 110 mmol/L (ref 101–111)
GFR calc non Af Amer: >60 mL/min (ref >60)
Glucose, Bld: 116 mg/dL — ABNORMAL HIGH (ref 65–99)
Potassium: 3.7 mmol/L (ref 3.5–5.1)
SODIUM: 137 mmol/L (ref 135–145)

## 2016-03-19 LAB — PROCALCITONIN

## 2016-03-19 LAB — CBC
HCT: 28.3 % — ABNORMAL LOW (ref 36.0–46.0)
HEMOGLOBIN: 8.3 g/dL — AB (ref 12.0–15.0)
MCH: 20.9 pg — ABNORMAL LOW (ref 26.0–34.0)
MCHC: 29.3 g/dL — AB (ref 30.0–36.0)
MCV: 71.3 fL — ABNORMAL LOW (ref 78.0–100.0)
Platelets: 272 10*3/uL (ref 150–400)
RBC: 3.97 MIL/uL (ref 3.87–5.11)
RDW: 21.9 % — AB (ref 11.5–15.5)
WBC: 7.3 10*3/uL (ref 4.0–10.5)

## 2016-03-19 LAB — GLUCOSE, CAPILLARY
GLUCOSE-CAPILLARY: 110 mg/dL — AB (ref 65–99)
GLUCOSE-CAPILLARY: 128 mg/dL — AB (ref 65–99)
GLUCOSE-CAPILLARY: 106 mg/dL — AB (ref 65–99)
Glucose-Capillary: 133 mg/dL — ABNORMAL HIGH (ref 65–99)

## 2016-03-19 MED ORDER — WARFARIN SODIUM 2.5 MG PO TABS
2.5000 mg | ORAL_TABLET | Freq: Once | ORAL | Status: AC
  Administered 2016-03-19: 2.5 mg via ORAL
  Filled 2016-03-19: qty 1

## 2016-03-19 MED ORDER — POLYETHYLENE GLYCOL 3350 17 G PO PACK
17.0000 g | PACK | Freq: Every day | ORAL | Status: DC
  Administered 2016-03-19 – 2016-03-20 (×2): 17 g via ORAL
  Filled 2016-03-19 (×2): qty 1

## 2016-03-19 MED ORDER — GUAIFENESIN ER 600 MG PO TB12
600.0000 mg | ORAL_TABLET | Freq: Two times a day (BID) | ORAL | Status: DC
  Administered 2016-03-19 – 2016-03-20 (×3): 600 mg via ORAL
  Filled 2016-03-19 (×3): qty 1

## 2016-03-19 MED ORDER — FOLIC ACID 1 MG PO TABS
1.0000 mg | ORAL_TABLET | Freq: Every day | ORAL | Status: DC
  Administered 2016-03-19 – 2016-03-20 (×2): 1 mg via ORAL
  Filled 2016-03-19 (×2): qty 1

## 2016-03-19 MED ORDER — BUTALBITAL-APAP-CAFFEINE 50-325-40 MG PO TABS
1.0000 | ORAL_TABLET | Freq: Once | ORAL | Status: AC
  Administered 2016-03-19: 1 via ORAL
  Filled 2016-03-19: qty 1

## 2016-03-19 MED ORDER — AMOXICILLIN-POT CLAVULANATE 875-125 MG PO TABS
1.0000 | ORAL_TABLET | Freq: Two times a day (BID) | ORAL | Status: DC
  Administered 2016-03-19 – 2016-03-20 (×2): 1 via ORAL
  Filled 2016-03-19 (×6): qty 1

## 2016-03-19 NOTE — Progress Notes (Signed)
[facility] Progress Note  Patient: Heather Cherry [id number]   PCP: Suzanna Obey, MD DOB: 1985-12-25   DOA: 03/17/2016   DOS: 03/19/2016   Date of Service: the patient was seen and examined on 03/19/2016  Brief hospital course: Pt. with PMH of PE, pseudotumor cerebri, Morbid obesity; admitted on 03/17/2016, with complaint of chest pain and shortness of breath, was found to have bronchitis. Currently further plan is continue antibiotics and monitor for improvement.  Assessment and Plan: 1. Pleuritic chest pain Acute bronchitis. Possible pneumonia. Patient started on Augmentin by critical care. CT chest without contrast also shows evidence of possible pleural effusion associated with pulmonary embolism. Appreciate pulmonary input. Currently not hypoxic. Incentive spirometry as well as flutter device and Mucinex.  2. 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 MRV on 02/18/2016 during the workup of pseudotumor cerebri was also suggestive of possible thrombosis in the venous sinuses. thought to be a provoked event with her being OC pills, with morbid obesity. Presented with PE on 03/12/2016. At which time started on heparin infusion with Coumadin bridge. Today the lower extremity Doppler is showing progression of the clot involving proximal femoral artery. I discussed with hematology, patient would not be an ideal candidate for Xarelto given her weight more than 130 kg as these subset of patient was excluded from the study. Given that the clot has progressed on the ultrasound, but it has not been checked since earlier Doppler on 02/18/2016, it will be wrong to suggest that this is a Coumadin failure. And this clot progression could be associated with progression while the patient was on Xarelto. Therefore hematology recommends to continue the patient on Coumadin and get serial ultrasound Doppler as an  outpatient. Also recommend to increase the INR range from 2-3 to 2.5-3.5.  3. Iron deficiency anemia. History of menorrhagia. Patient will continue to follow-up with PCP. H&H currently stable. Received PRBC as an outpatient.  4. Pseudotumor cerebri. Continue Diamox.  Pain management: When necessary OxyIR, when necessary Tylenol Activity: No indication for physical therapy Bowel regimen: last BM prior to admission Diet: Regular diet  Advance goals of care discussion: Full code  Family Communication: family was present at bedside, at the time of interview. The pt provided permission to discuss medical plan with the family. Opportunity was given to ask question and all questions were answered satisfactorily.   Disposition:  Discharge to home. Expected discharge date: 03/20/2016,   Consultants: Pulmonary critical care Procedures: Lower extremity Doppler  Antibiotics: Anti-infectives    Start     Dose/Rate Route Frequency Ordered Stop   03/19/16 1230  amoxicillin-clavulanate (AUGMENTIN) 875-125 MG per tablet 1 tablet     1 tablet Oral Every 12 hours 03/19/16 1222          Subjective: Continues to have some shortness of breath on exertion. No chest pain. No nausea and vomiting.  Objective: Physical Exam: Vitals:   03/19/16 0449 03/19/16 0819 03/19/16 1312 03/19/16 1621  BP:  (!) 117/56 111/66 130/88  Pulse:  92 97 98  Resp:  (!) 21 15 18   Temp:  98.8 F (37.1 C) 98.1 F (36.7 C)   TempSrc:  Oral Oral   SpO2:  100% 95% 99%  Weight: (!) 171 kg (377 lb)     Height:        Intake/Output Summary (Last 24 hours) at 03/19/16 1853 Last data filed at 03/19/16 1200  Gross per 24 hour  Intake  1197.92 ml  Output             1750 ml  Net          -552.08 ml   Filed Weights   03/17/16 2329 03/19/16 0449  Weight: (!) 168.6 kg (371 lb 12.8 oz) (!) 171 kg (377 lb)    General: Alert, Awake and Oriented to Time, Place and Person. Appear in mild distress, affect  appropriate Eyes: PERRL, Conjunctiva normal ENT: Oral Mucosa clear moist. Neck: difficult to assess JVD, no Abnormal Mass Or lumps Cardiovascular: S1 and S2 Present, no Murmur, Respiratory: Bilateral Air entry equal and Decreased, no use of accessory muscle, Clear to Auscultation, no Crackles, no wheezes Abdomen: Bowel Sound present, Soft and no tenderness Skin: no redness, no Rash, no induration Extremities: trace Pedal edema, no calf tenderness Neurologic: Grossly no focal neuro deficit. Bilaterally Equal motor strength  Data Reviewed: CBC:  Recent Labs Lab 03/16/16 0436 03/17/16 0513 03/17/16 1913 03/18/16 0724 03/19/16 0157  WBC 8.4 8.6 9.4 8.1 7.3  NEUTROABS  --   --   --  5.3  --   HGB 8.7* 9.3* 9.2* 8.9* 8.3*  HCT 27.8* 30.3* 31.1* 29.5* 28.3*  MCV 67.5* 68.8* 70.0* 71.1* 71.3*  PLT 324 309 318 266 [id number]   Basic Metabolic Panel:  Recent Labs Lab 03/14/16 0229 03/15/16 0802 03/16/16 0436 03/17/16 1913 03/18/16 0724 03/19/16 0157  NA 137 134* 137 138 139 137  K 3.8 3.4* 3.6 3.9 3.7 3.7  CL 109 105 107 106 106 110  CO2 23 23 23  20* 25 21*  GLUCOSE 139* 129* 130* 119* 135* 116*  BUN 10 9 8 7 8 7   CREATININE 0.70 0.73 0.74 0.79 0.77 0.77  CALCIUM 8.4* 8.8* 8.8* 9.3 9.0 8.4*  MG 2.2  --   --   --   --   --     Liver Function Tests:  Recent Labs Lab 03/18/16 0724  AST 24  ALT 25  ALKPHOS 111  BILITOT 0.6  PROT 6.6  ALBUMIN 2.9*   No results for input(s): LIPASE, AMYLASE in the last 168 hours. No results for input(s): AMMONIA in the last 168 hours. Coagulation Profile:  Recent Labs Lab 03/16/16 0436 03/17/16 0513 03/17/16 1913 03/18/16 0724 03/19/16 0157  INR 1.63 2.30 2.08 2.28 3.12   Cardiac Enzymes:  Recent Labs Lab 03/18/16 0135 03/18/16 0724 03/18/16 1226  TROPONINI 1.35* <0.03 <0.03   BNP (last 3 results) No results for input(s): PROBNP in the last 8760 hours.  CBG:  Recent Labs Lab 03/18/16 1709 03/18/16 2141  03/19/16 0818 03/19/16 1309 03/19/16 1801  GLUCAP 96 120* 110* 128* 106*    Studies: No results found.   Scheduled Meds: . acetaZOLAMIDE  500 mg Oral BID  . amoxicillin-clavulanate  1 tablet Oral Q12H  . ferrous sulfate  325 mg Oral TID WC  . folic acid  1 mg Oral Daily  . gabapentin  300 mg Oral TID  . guaiFENesin  600 mg Oral BID  . insulin aspart  0-9 Units Subcutaneous TID WC  . pantoprazole  40 mg Oral Daily  . polyethylene glycol  17 g Oral Daily  . Warfarin - Pharmacist Dosing Inpatient   Does not apply q1800   Continuous Infusions: PRN Meds: acetaminophen **OR** acetaminophen, clonazePAM, ondansetron **OR** ondansetron (ZOFRAN) IV, oxyCODONE  Time spent: 30 minutes  Author: Berle Mull, MD [facility] Pager: [phone] 03/19/2016 6:53 PM  If 7PM-7AM, please contact night-coverage at [email or web address],  password [facility]

## 2016-03-19 NOTE — Progress Notes (Signed)
Name: Heather Cherry MRN: [id number] DOB: 05/10/1985    ADMISSION DATE:  03/17/2016 CONSULTATION DATE:  12/6  REFERRING MD :  Hal Hope   CHIEF COMPLAINT:  Right sided chest pain   BRIEF PATIENT DESCRIPTION:  30 year old w/ recent dx of DVT then PE on Xarelto. Just discharged 12/5 but returned to the ER on 12/6 w/ CC right sided CP and worsening shortness of breath.   SIGNIFICANT EVENTS  12/5 discharged 12/6 readmitted.    STUDIES:   11/13: The examination is positive for age-indeterminate (potentially chronic) occlusive DVT within distal aspect of the popliteal vein extending to involve (at least) the right posterior tibial vein. 11/30: CT chest: Acute nonocclusive RIGHT segmental to subsegmental pulmonary emboli. CT evidence of right heart strain (RV/LV Ratio = 1) associated with an increased risk of morbidity and mortality 12/1 ECHO: - The right ventricular systolic pressure was increased consistent with mild pulmonary hypertension. Normal left ventricular   systolic function with mild diastolic dysfunction and mild   pulmonary hypertension. No clot seen in the right atrium or right   ventricle with mildly dilated right ventricle. 12/6: ECHO>>>Left ventricle: The cavity size was normal. Wall thickness was   increased in a pattern of mild LVH. Systolic function was normal.   The estimated ejection fraction was in the range of 60% to 65%.   Wall motion was normal; there were no regional wall motion   abnormalities. - Pulmonary arteries: Systolic pressure was moderately increased.   PA peak pressure: 48 mm Hg (S). 12/6: CT (non-contrast)>>>New right upper and lower lobe consolidations with atelectasis in the right middle lobe and both lower lobes. Small right effusion. 12/6 LE dopplers>>> 12/6: CRP>>>6.6 12/6 ESR>>>6 2  SUBJECTIVE:  Chest hurts when ever she moves but seems a little better.   VITAL SIGNS: Temp:  [97.9 F (36.6 C)-99 F (37.2 C)] 98.8 F (37.1 C)  (12/07 0819) Pulse Rate:  [92-98] 92 (12/07 0819) Resp:  [18-24] 21 (12/07 0819) BP: (109-129)/(49-76) 117/56 (12/07 0819) SpO2:  [97 %-100 %] 100 % (12/07 0819) Weight:  [377 lb (171 kg)] 377 lb (171 kg) (12/07 0449)  PHYSICAL EXAMINATION: General:  30 year old obese female, awake, alert. No focal def  Neuro:  Awake, oriented. No focal motor def  HEENT:  Neck is large. No appreciable JVD  Cardiovascular:  rrr w/out MRG Lungs:  Some loose posterior rhonchi  Abdomen:  Soft, obese + bowel sounds  Musculoskeletal:  Equal st and bulk  Skin:  Warm and dry    Recent Labs Lab 03/17/16 1913 03/18/16 0724 03/19/16 0157  NA 138 139 137  K 3.9 3.7 3.7  CL 106 106 110  CO2 20* 25 21*  BUN 7 8 7   CREATININE 0.79 0.77 0.77  GLUCOSE 119* 135* 116*    Recent Labs Lab 03/17/16 1913 03/18/16 0724 03/19/16 0157  HGB 9.2* 8.9* 8.3*  HCT 31.1* 29.5* 28.3*  WBC 9.4 8.1 7.3  PLT 318 266 272   Dg Chest 2 View  Result Date: 03/17/2016 CLINICAL DATA:  Chest pain and dyspnea for 5 days. EXAM: CHEST  2 VIEW COMPARISON:  03/12/2016 FINDINGS: Slight patchy opacity in the central right lung may represent infectious infiltrate, atelectasis, pulmonary infarction. This is accentuated by a shallow degree of inspiration. The left lung is clear. There is no pleural effusion. Hilar and mediastinal contours are unremarkable and unchanged. IMPRESSION: Patchy airspace opacity in the right lung. Considerations include infectious infiltrate, atelectasis, pulmonary infarction. Electronically  Signed   By: Andreas Newport M.D.   On: 03/17/2016 21:11   Ct Chest Wo Contrast  Result Date: 03/18/2016 CLINICAL DATA:  Severe dyspnea since yesterday. History of DVT. Diagnostic pulmonary embolus last week. EXAM: CT CHEST WITHOUT CONTRAST TECHNIQUE: Multidetector CT imaging of the chest was performed following the standard protocol without IV contrast. COMPARISON:  03/12/2016 CT FINDINGS: Cardiovascular: The heart is  top-normal in size. No coronary arteriosclerosis. Normal takeoff of the great vessels. No pericardial effusion. Mediastinum/Nodes: No supraclavicular, axillary nor mediastinal adenopathy. Nor aortic aneurysm. Visualized trachea and mainstem bronchi are patent. The esophagus is unremarkable. Lungs/Pleura: New right upper lobe and right lower lobe pneumonic consolidations with right middle and bilateral lower lobe atelectasis. Small right parapneumonic effusion. No pneumothorax. Upper Abdomen: Negative for acute abnormalities Musculoskeletal: Negative IMPRESSION: New right upper and lower lobe pneumonic consolidations with atelectasis in the right middle lobe and both lower lobes. Small right parapneumonic effusion. Electronically Signed   By: Ashley Royalty M.D.   On: 03/18/2016 17:12   RLL airspace disease.   ASSESSMENT / PLAN:  PE/DVT w/ xarelto failure  Right sided pleuritic Chest pain  Patchy right lower lobe airspace disease-->favor infarct but PNA process is a consideration  CEs are negative. Pain after PE is not unusual. Certainly not unusual to have discomfort.  She is hemodynamically stable and it seems as though her symptoms are out of proportion to her physical exam. This may all be from infarct although she does have purulent sputum so PNA a possibility   Plan Cont coumadin  PRN analgesia  Start Augmentin Ck PCT; Watch fever and WBC curve   Erick Colace ACNP-BC [place] Pager # [phone] OR # [phone] if no answer  03/19/2016, 11:50 AM

## 2016-03-19 NOTE — Progress Notes (Signed)
Pt transferred from 4E to unit via wheelchair with belongings to the side; pt A&O x4; pt oriented to the unit and room; fall/safety prevention and precaution education completed with pt and pt voices understanding. Telemetry applied and verified with CCMD and NT called to second verify. Pt skin remain intact and not wounds or pressure ulcer noted. Will continue to closely monitor pt. Delia Heady RN

## 2016-03-19 NOTE — Progress Notes (Signed)
ANTICOAGULATION CONSULT NOTE - Follow Up Consult  Pharmacy Consult for Warfarin Indication: pulmonary embolus and DVT  Allergies  Allergen Reactions  . Lisinopril Cough    Patient Measurements: Height: 5\' 9"  (175.3 cm) Weight: (!) 377 lb (171 kg) IBW/kg (Calculated) : 66.2  Vital Signs: Temp: 98.8 F (37.1 C) (12/07 0819) Temp Source: Oral (12/07 0819) BP: 117/56 (12/07 0819) Pulse Rate: 92 (12/07 0819)  Labs:  Recent Labs  03/17/16 0513 03/17/16 1913 03/18/16 0135 03/18/16 0724 03/18/16 1226 03/19/16 0157  HGB 9.3* 9.2*  --  8.9*  --  8.3*  HCT 30.3* 31.1*  --  29.5*  --  28.3*  PLT 309 318  --  266  --  272  LABPROT 25.7* 23.7*  --  25.5*  --  32.8*  INR 2.30 2.08  --  2.28  --  3.12  HEPARINUNFRC 0.23*  --   --   --   --   --   CREATININE  --  0.79  --  0.77  --  0.77  TROPONINI  --   --  1.35* <0.03 <0.03  --     Estimated Creatinine Clearance: 177.1 mL/min (by C-G formula based on SCr of 0.77 mg/dL).  Assessment: 30 y/o F recently transitioned to warfarin due to development of PE while on xarelto that was admitted to [facility] from 11/30 to 12/5 for management of PE and heparin to warfarin bridge who presented to the [facility] on 12/5 (same day as discharge from [facility]) with worsening R sided chest pain. Pharmacy consulted to continue warfarin.  INR today is slightly SUPRAtherapeutic (INR 3.12 << 2.28, goal of 2-3). Hgb/Hct slight drop, plts wnl. No overt s/sx of bleeding noted at this time. Will lower the dose today.  The patient is noted to have documented warfarin education done on 12/1 at [facility] by Larene Beach, [facility]  Goal of Therapy:  INR 2-3   Plan:  1. Warfarin 2.5 mg x 1 dose at 1800 2. Will continue to monitor for any signs/symptoms of bleeding and will follow up with PT/INR in the a.m.   Thank you for allowing pharmacy to be a part of this patient's care.  Alycia Rossetti, PharmD, BCPS Clinical Pharmacist Pager: [phone] Clinical phone for 03/19/2016  from 7a-3:30p: [phone] If after 3:30p, please call main pharmacy at: [phone] 03/19/2016 11:09 AM

## 2016-03-19 NOTE — Progress Notes (Signed)
*  PRELIMINARY RESULTS* Vascular Ultrasound BIlateral lower extremity venous duplex has been completed.  Preliminary findings: Right lower extremity in positive for acute deep vain thrombosis in the proximal femoral vein extending throughout the extremity. The left lower extremity is negative for deep vein thrombosis. Negative for bakers cysts bilaterally. Preliminary results called to Salvadore Dom at 14:35  Everrett Coombe 03/19/2016, 2:35 PM

## 2016-03-19 NOTE — Progress Notes (Signed)
Attempted to call report. RN not available.

## 2016-03-20 DIAGNOSIS — R0781 Pleurodynia: Secondary | ICD-10-CM | POA: Diagnosis not present

## 2016-03-20 LAB — GLUCOSE, CAPILLARY
GLUCOSE-CAPILLARY: 96 mg/dL (ref 65–99)
Glucose-Capillary: 104 mg/dL — ABNORMAL HIGH (ref 65–99)

## 2016-03-20 LAB — BASIC METABOLIC PANEL
ANION GAP: 11 (ref 5–15)
BUN: 8 mg/dL (ref 6–20)
CHLORIDE: 109 mmol/L (ref 101–111)
CO2: 17 mmol/L — AB (ref 22–32)
Calcium: 8.5 mg/dL — ABNORMAL LOW (ref 8.9–10.3)
Creatinine, Ser: 0.78 mg/dL (ref 0.44–1.00)
GFR calc Af Amer: >60 mL/min (ref >60)
GFR calc non Af Amer: >60 mL/min (ref >60)
GLUCOSE: 129 mg/dL — AB (ref 65–99)
POTASSIUM: 4.5 mmol/L (ref 3.5–5.1)
Sodium: 137 mmol/L (ref 135–145)

## 2016-03-20 LAB — PROTIME-INR
INR: 2.68
Prothrombin Time: 29 seconds — ABNORMAL HIGH (ref 11.4–15.2)

## 2016-03-20 LAB — CBC
HEMATOCRIT: 28.8 % — AB (ref 36.0–46.0)
HEMOGLOBIN: 8.5 g/dL — AB (ref 12.0–15.0)
MCH: 21.3 pg — AB (ref 26.0–34.0)
MCHC: 29.5 g/dL — ABNORMAL LOW (ref 30.0–36.0)
MCV: 72.2 fL — AB (ref 78.0–100.0)
Platelets: 282 10*3/uL (ref 150–400)
RBC: 3.99 MIL/uL (ref 3.87–5.11)
RDW: 22.1 % — ABNORMAL HIGH (ref 11.5–15.5)
WBC: 8 10*3/uL (ref 4.0–10.5)

## 2016-03-20 LAB — BETA-2-GLYCOPROTEIN I ABS, IGG/M/A
BETA 2 GLYCO I IGG: 46 GPI IgG units — AB (ref 0–20)
Beta-2-Glycoprotein I IgA: 9 GPI IgA units (ref 0–25)
Beta-2-Glycoprotein I IgM: <9 GPI IgM units (ref 0–32)

## 2016-03-20 MED ORDER — FOLIC ACID 1 MG PO TABS: 1.0000 mg | ORAL_TABLET | Freq: Every day | ORAL | 0 refills | Status: DC

## 2016-03-20 MED ORDER — AMOXICILLIN-POT CLAVULANATE 875-125 MG PO TABS
1.0000 | ORAL_TABLET | Freq: Two times a day (BID) | ORAL | 0 refills | Status: DC
Start: 2016-03-20 — End: 2016-03-26

## 2016-03-20 MED ORDER — WARFARIN SODIUM 5 MG PO TABS: 5.0000 mg | ORAL_TABLET | Freq: Once | ORAL | Status: DC

## 2016-03-20 MED ORDER — POLYETHYLENE GLYCOL 3350 17 G PO PACK
17.0000 g | PACK | Freq: Every day | ORAL | 0 refills | Status: DC
Start: 2016-03-20 — End: 2017-03-05

## 2016-03-20 MED ORDER — GUAIFENESIN ER 600 MG PO TB12: 600.0000 mg | ORAL_TABLET | Freq: Two times a day (BID) | ORAL | 0 refills | Status: DC

## 2016-03-20 NOTE — Care Management Note (Signed)
Case Management Note Marvetta Gibbons RN, BSN Unit 2W-Case Manager [phone]  Patient Details  Name: Dedria Lafever MRN: [id number] Date of Birth: [birth year]  Subjective/Objective:   Pt admitted with chest pain                 Action/Plan: PTA pt lived at home- plan to return home- order placed for home -02- however pt did not qualify for home 02- and does not need home 02 for discharge- no DME arrangements will be made.   Expected Discharge Date:  03/20/16               Expected Discharge Plan:  Home/Self Care  In-House Referral:     Discharge planning Services  CM Consult  Post Acute Care Choice:  Durable Medical Equipment Choice offered to:     DME Arranged:  Oxygen DME Agency:     HH Arranged:    HH Agency:     Status of Service:  Completed, signed off  If discussed at [employer] of Stay Meetings, dates discussed:    Additional Comments:  Dawayne Patricia, RN 03/20/2016, 2:36 PM

## 2016-03-20 NOTE — Evaluation (Signed)
Physical Therapy Evaluation Patient Details Name: Heather Cherry MRN: [id number] DOB: 1985-08-17 Today's Date: 03/20/2016   History of Present Illness  Pt adm with SOB and pleuritic chest pain. Pt found to have bronchitis and recurrent DVT. PMH - DVT 11/17, morbid obesity, HTN  Clinical Impression  Pt doing well with mobility and no further PT needed.  Ready for dc from PT standpoint. Pt with SpO2 of 98% or greater with amb including stairs. Used dynamap with waveform reading to measure this and good waveform present with these readings. HR did go up to 137 with mobility.       Follow Up Recommendations No PT follow up    Equipment Recommendations  None recommended by PT    Recommendations for Other Services       Precautions / Restrictions Precautions Precautions: None      Mobility  Bed Mobility                  Transfers Overall transfer level: Independent                  Ambulation/Gait Ambulation/Gait assistance: Independent Ambulation Distance (Feet): 350 Feet Assistive device: None Gait Pattern/deviations: Wide base of support     General Gait Details: Pt took several standing rest breaks. Pt with HR to 137 with amb and stairs. SpO2 98% or greater on RA with good waveform on pulse ox.  Stairs Stairs: Yes   Stair Management: No rails;Alternating pattern;Forwards Number of Stairs: 16 General stair comments: Steady gait. Took standing rest break after going up stairs and before returning down stairs.   Wheelchair Mobility    Modified Rankin (Stroke Patients Only)       Balance Overall balance assessment: Independent                                           Pertinent Vitals/Pain Pain Assessment: No/denies pain    Home Living Family/patient expects to be discharged to:: Private residence Living Arrangements: Other relatives           Home Layout: Two level;Bed/bath upstairs Home Equipment: None      Prior  Function Level of Independence: Independent               Hand Dominance        Extremity/Trunk Assessment   Upper Extremity Assessment: Overall WFL for tasks assessed           Lower Extremity Assessment: Overall WFL for tasks assessed         Communication   Communication: No difficulties  Cognition Arousal/Alertness: Awake/alert Behavior During Therapy: WFL for tasks assessed/performed Overall Cognitive Status: Within Functional Limits for tasks assessed                      General Comments      Exercises     Assessment/Plan    PT Assessment Patent does not need any further PT services  PT Problem List            PT Treatment Interventions      PT Goals (Current goals can be found in the Care Plan section)  Acute Rehab PT Goals PT Goal Formulation: All assessment and education complete, DC therapy    Frequency     Barriers to discharge        Co-evaluation  End of Session   Activity Tolerance: Patient limited by fatigue Patient left: in chair Nurse Communication: Mobility status;Other (comment) (SpO2)         Time: [id number] PT Time Calculation (min) (ACUTE ONLY): 13 min   Charges:   PT Evaluation $PT Eval Low Complexity: 1 Procedure     PT G CodesShary Cherry Heather Cherry Mar 31, 2016, 2:39 PM [facility] PT [phone]

## 2016-03-20 NOTE — Progress Notes (Signed)
ANTICOAGULATION CONSULT NOTE - Follow Up Consult  Pharmacy Consult for Coumadin Indication: pulmonary embolus and DVT  Allergies  Allergen Reactions  . Lisinopril Cough    Patient Measurements: Height: 5\' 9"  (175.3 cm) Weight: (!) 377 lb 6.4 oz (171.2 kg) IBW/kg (Calculated) : 66.2  Vital Signs: Temp: 98.1 F (36.7 C) (12/08 0615) Temp Source: Oral (12/08 0615) BP: 116/58 (12/08 0615) Pulse Rate: 81 (12/08 0615)  Labs:  Recent Labs  03/18/16 0135 03/18/16 0724 03/18/16 1226 03/19/16 0157 03/20/16 0307  HGB  --  8.9*  --  8.3* 8.5*  HCT  --  29.5*  --  28.3* 28.8*  PLT  --  266  --  272 282  LABPROT  --  25.5*  --  32.8* 29.0*  INR  --  2.28  --  3.12 2.68  CREATININE  --  0.77  --  0.77 0.78  TROPONINI 1.35* <0.03 <0.03  --   --     Estimated Creatinine Clearance: 177.2 mL/min (by C-G formula based on SCr of 0.78 mg/dL).  Assessment: 30yof on xarelto for hx DVT, found to have a new PE a few weeks later and was transitioned to coumadin with a heparin bridge. She completed 5 days of overlap therapy and heparin stopped. Today's INR is therapeutic at 2.68 but trending down. CBC stable. No bleeding.  Goal of Therapy:  INR 2-3  Monitor platelets by anticoagulation protocol: Yes   Plan: 1) Coumadin 5mg  x 1 2) Daily INR   Nena Jordan, PharmD, BCPS 03/20/2016 9:55 AM

## 2016-03-22 LAB — CARDIOLIPIN ANTIBODIES, IGG, IGM, IGA
Anticardiolipin IgA: <9 APL U/mL (ref 0–11)
Anticardiolipin IgG: 58 GPL U/mL — ABNORMAL HIGH (ref 0–14)
Anticardiolipin IgM: 15 MPL U/mL — ABNORMAL HIGH (ref 0–12)

## 2016-03-23 LAB — DRVVT MIX: dRVVT Mix: 65.4 s — ABNORMAL HIGH (ref 0.0–47.0)

## 2016-03-23 LAB — LUPUS ANTICOAGULANT PANEL
DRVVT: 126.3 s — ABNORMAL HIGH (ref 0.0–47.0)
PTT Lupus Anticoagulant: 99.8 s — ABNORMAL HIGH (ref 0.0–51.9)

## 2016-03-23 LAB — DRVVT CONFIRM: dRVVT Confirm: 2 ratio — ABNORMAL HIGH (ref 0.8–1.2)

## 2016-03-23 LAB — PTT-LA MIX: PTT-LA Mix: 68.7 s — ABNORMAL HIGH (ref 0.0–48.9)

## 2016-03-23 LAB — HEXAGONAL PHASE PHOSPHOLIPID: HEXAGONAL PHASE PHOSPHOLIPID: 31 s — AB (ref 0–11)

## 2016-03-23 NOTE — Discharge Summary (Signed)
[facility] Discharge Summary   Patient: Heather Cherry [id number]   PCP: Suzanna Obey, MD DOB: 10/01/1985   Date of admission: 03/17/2016   Date of discharge: 03/20/2016     Discharge Diagnoses:  Principal Problem:   Pleuritic chest pain Active Problems:   HTN (hypertension)   Right pulmonary embolus (HCC)   Chest pain   Pseudotumor cerebri   Admitted From: Home Disposition:  Home  Recommendations for Outpatient Follow-up:  1. Follow-up with PCP in one week. 2. Recommend PCP to follow-up on the hypercoagulable workup. 3. Patient will need protein C protein S homocystine antithrombin III level checked in 3 months.   Follow-up Information    Suzanna Obey, MD. Schedule an appointment as soon as possible for a visit on 03/23/2016.   Specialty:  Family Medicine Why:  INR check up.   Contact information: [address] [place] [address] [phone]          Diet recommendation: Cardiac diet  Activity: The patient is advised to gradually reintroduce usual activities.  Discharge Condition: good  Code Status: Full code  History of present illness: As per the H and P dictated on admission, "Heather Cherry is a 30 y.o. female with recently diagnosed pulmonary embolism and DVT discharged from [facility] yesterday presents to the ER because of worsening chest pain. Patient was diagnosed with DVT of the lower extremity 2 weeks ago and was on xarelto. Patient started developing right-sided pleuritic chest pain last week and had gone to the ER at [facility] and was diagnosed with right-sided segmental and subsegmental PE with possible strain pattern. 2-D echo done showed [facility] Course:   Summary of her active problems in the hospital is as following. 1. Pleuritic chest pain Acute bronchitis. Possible pneumonia. CT chest without contrast also shows evidence of possible pleural effusion associated with  pulmonary embolism. Appreciate pulmonary input. Recommend to start the patient on Augmentin Currently not hypoxic. Incentive spirometry as well as flutter device and Mucinex.  2. 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 MRV on 02/18/2016 during the workup of pseudotumor cerebri was also suggestive of possible thrombosis in the venous sinuses. thought to be a provoked event with her being OC pills, with morbid obesity. Presented with PE on 03/12/2016. At which time started on heparin infusion with Coumadin bridge. This hospitalization the lower extremity Doppler is showing progression of the clot involving proximal femoral artery. I discussed with hematology, as per the discussion;  - patient would not be an ideal candidate for Xarelto given her weight more than 130 kg as these subset of patient was excluded from the study. - Given that the clot has progressed on the ultrasound, but it has not been checked since earlier Doppler on 02/18/2016, it will be wrong to suggest that this is a Coumadin failure. And this clot progression could be associated with progression while the patient was on Xarelto.  Therefore hematology recommends to continue the patient on Coumadin and get serial ultrasound Doppler as an outpatient. Also recommend to increase the INR range from 2-3 to 2.5-3.5. Also initiated hypercoagulable workup. In the hospital for genetic markers. Recommend PCP to follow on them  Patient will need markers protein C, protein S, antithrombin III, homocystine level as an outpatient in 3 months.  3. Iron deficiency anemia. History of menorrhagia. Patient will continue to follow-up with PCP. H&H currently stable.  Received PRBC as an outpatient.  4. Pseudotumor cerebri. Continue Diamox.  All other chronic medical condition were stable during the hospitalization.  Patient was seen by physical therapy,  who recommended no further therapy for that On the day of the discharge the patient's vitals are stable, and no other acute medical condition were reported by patient. the patient was felt safe to be discharge at home with family.  Procedures and Results:  Echocardiogram  Study Conclusions  - Left ventricle: The cavity size was normal. Wall thickness was   increased in a pattern of mild LVH. Systolic function was normal.   The estimated ejection fraction was in the range of 60% to 65%.   Wall motion was normal; there were no regional wall motion   abnormalities. - Pulmonary arteries: Systolic pressure was moderately increased.   PA peak pressure: 48 mm Hg (S).   Lower extremity vascular Doppler Summary:  - Findings consistent with acute deep vein thrombosis involving the   right proximal to mid femoral vein through the lower extremity.   Thrombosis appears more extensive than prior study on 02/24/16. - No evidence of deep vein thrombosis involving the visualized   veins of the left lower extremity. - No evidence of Baker&'s cyst on the right or left.  Consultations:  Critical care medicine  Phone consultation with hematology  DISCHARGE MEDICATION: Discharge Medication List as of 03/20/2016  3:28 PM    START taking these medications   Details  amoxicillin-clavulanate (AUGMENTIN) 875-125 MG tablet Take 1 tablet by mouth every 12 (twelve) hours., Starting Fri 03/20/2016, Until Wed Q000111Q, Normal    folic acid (FOLVITE) 1 MG tablet Take 1 tablet (1 mg total) by mouth daily., Starting Fri 03/20/2016, Normal    guaiFENesin (MUCINEX) 600 MG 12 hr tablet Take 1 tablet (600 mg total) by mouth 2 (two) times daily., Starting Fri 03/20/2016, Normal    polyethylene glycol (MIRALAX / GLYCOLAX) packet Take 17 g by mouth daily., Starting Fri 03/20/2016, Normal      CONTINUE these medications which have NOT CHANGED   Details  acetaZOLAMIDE (DIAMOX) 250 MG tablet Take 500 mg by mouth  2 (two) times daily., Historical Med    clonazePAM (KLONOPIN) 0.5 MG tablet Take 0.5 mg by mouth 2 (two) times daily as needed for anxiety., Until Discontinued, Historical Med    ferrous sulfate 325 (65 FE) MG tablet Take 325 mg by mouth 3 (three) times daily with meals., Historical Med    gabapentin (NEURONTIN) 300 MG capsule Take 300 mg by mouth 3 (three) times daily., Historical Med    HYDROcodone-acetaminophen (NORCO/VICODIN) 5-325 MG tablet Take 1-2 tablets by mouth every 4 (four) hours as needed for moderate pain., Starting Tue 03/17/2016, Print    metFORMIN (GLUCOPHAGE-XR) 500 MG 24 hr tablet Take 1,000 mg by mouth daily with breakfast., Historical Med    omeprazole (PRILOSEC) 20 MG capsule Take 20 mg by mouth daily., Historical Med    warfarin (COUMADIN) 7.5 MG tablet Take 1 tablet (7.5 mg total) by mouth daily., Starting Tue 03/17/2016, Normal      STOP taking these medications     amLODipine (NORVASC) 5 MG tablet      hydrochlorothiazide (HYDRODIURIL) 25 MG tablet        Allergies  Allergen Reactions  . Lisinopril Cough   Discharge Instructions    Diet - low sodium heart healthy    Complete by:  As directed    Discharge instructions    Complete by:  As directed    It is important that you read following instructions as well as go over your medication list with RN to help you understand your care after this hospitalization.  Discharge Instructions: Please follow-up with PCP in one week  Please request your primary care physician to go over all Hospital Tests and Procedure/Radiological results at the follow up,  Please get all Hospital records sent to your PCP by signing hospital release before you go home.   Do not take more than prescribed Pain, Sleep and Anxiety Medications. You were cared for by a hospitalist during your hospital stay. If you have any questions about your discharge medications or the care you received while you were in the hospital after you are  discharged, you can call the unit and ask to speak with the hospitalist on call if the hospitalist that took care of you is not available.  Once you are discharged, your primary care physician will handle any further medical issues. Please note that NO REFILLS for any discharge medications will be authorized once you are discharged, as it is imperative that you return to your primary care physician (or establish a relationship with a primary care physician if you do not have one) for your aftercare needs so that they can reassess your need for medications and monitor your lab values. You Must read complete instructions/literature along with all the possible adverse reactions/side effects for all the Medicines you take and that have been prescribed to you. Take any new Medicines after you have completely understood and accept all the possible adverse reactions/side effects. Wear Seat belts while driving. If you have smoked or chewed Tobacco in the last 2 yrs please stop smoking and/or stop any Recreational drug use.   Increase activity slowly    Complete by:  As directed      Discharge Exam: Filed Weights   03/17/16 2329 03/19/16 0449 03/20/16 0615  Weight: (!) 168.6 kg (371 lb 12.8 oz) (!) 171 kg (377 lb) (!) 171.2 kg (377 lb 6.4 oz)   Vitals:   03/20/16 0615 03/20/16 1335  BP: (!) 116/58 127/72  Pulse: 81 89  Resp:  17  Temp: 98.1 F (36.7 C) 98.2 F (36.8 C)   General: Appear in no distress, no Rash; Oral Mucosa moist. Cardiovascular: S1 and S2 Present, no Murmur, no JVD Respiratory: Bilateral Air entry present and Clear to Auscultation, no Crackles, no wheezes Abdomen: Bowel Sound present, Soft and no tenderness Extremities: no Pedal edema, no calf tenderness Neurology: Grossly no focal neuro deficit.  The results of significant diagnostics from this hospitalization (including imaging, microbiology, ancillary and laboratory) are listed below for reference.    Significant  Diagnostic Studies: Dg Chest 2 View  Result Date: 03/17/2016 CLINICAL DATA:  Chest pain and dyspnea for 5 days. EXAM: CHEST  2 VIEW COMPARISON:  03/12/2016 FINDINGS: Slight patchy opacity in the central right lung may represent infectious infiltrate, atelectasis, pulmonary infarction. This is accentuated by a shallow degree of inspiration. The left lung is clear. There is no pleural effusion. Hilar and mediastinal contours are unremarkable and unchanged. IMPRESSION: Patchy airspace opacity in the right lung. Considerations include infectious infiltrate, atelectasis, pulmonary infarction. Electronically Signed   By: Andreas Newport M.D.   On: 03/17/2016 21:11   Dg Chest 2 View  Result Date: 03/12/2016 CLINICAL DATA:  Chest pain under the right breast EXAM: CHEST  2 VIEW COMPARISON:  None. FINDINGS: The heart size and mediastinal contours are within normal limits.  Both lungs are clear. The visualized skeletal structures are unremarkable. Surgical clips in the right upper quadrant. IMPRESSION: No active cardiopulmonary disease. Electronically Signed   By: Donavan Foil M.D.   On: 03/12/2016 18:34   Ct Chest Wo Contrast  Result Date: 03/18/2016 CLINICAL DATA:  Severe dyspnea since yesterday. History of DVT. Diagnostic pulmonary embolus last week. EXAM: CT CHEST WITHOUT CONTRAST TECHNIQUE: Multidetector CT imaging of the chest was performed following the standard protocol without IV contrast. COMPARISON:  03/12/2016 CT FINDINGS: Cardiovascular: The heart is top-normal in size. No coronary arteriosclerosis. Normal takeoff of the great vessels. No pericardial effusion. Mediastinum/Nodes: No supraclavicular, axillary nor mediastinal adenopathy. Nor aortic aneurysm. Visualized trachea and mainstem bronchi are patent. The esophagus is unremarkable. Lungs/Pleura: New right upper lobe and right lower lobe pneumonic consolidations with right middle and bilateral lower lobe atelectasis. Small right parapneumonic  effusion. No pneumothorax. Upper Abdomen: Negative for acute abnormalities Musculoskeletal: Negative IMPRESSION: New right upper and lower lobe pneumonic consolidations with atelectasis in the right middle lobe and both lower lobes. Small right parapneumonic effusion. Electronically Signed   By: Ashley Royalty M.D.   On: 03/18/2016 17:12   Ct Angio Chest Pe W Or Wo Contrast  Result Date: 03/12/2016 CLINICAL DATA:  Chest pain with inspiration. On blood thinner for RIGHT lower extremity deep vein thrombosis diagnosed February 24, 2016. EXAM: CT ANGIOGRAPHY CHEST WITH CONTRAST TECHNIQUE: Multidetector CT imaging of the chest was performed using the standard protocol during bolus administration of intravenous contrast. Multiplanar CT image reconstructions and MIPs were obtained to evaluate the vascular anatomy. CONTRAST:  75 cc Isovue 370 COMPARISON:  Chest radiograph March 12, 2016 at 1829 hours FINDINGS: Large body habitus results in overall noisy image quality. CARDIOVASCULAR: Suboptimal contrast opacification of the pulmonary artery's (164 Hounsfield units, target is 250 Hounsfield units. Isodense pulmonary artery, aorta and pulmonary veins. Main pulmonary artery is not enlarged. Linear filling defect RIGHT lower lobar pulmonary artery, acute RIGHT lower lobe segmental nonocclusive pulmonary emboli. Probable RIGHT upper lobe nonocclusive subsegmental PE. Heart size is normal, mild RIGHT heart strain (RV/ LV = 1). No pericardial effusions. Thoracic aorta is normal course and caliber, unremarkable. MEDIASTINUM/NODES: No lymphadenopathy by CT size criteria. LUNGS/PLEURA: Tracheobronchial tree is patent, no pneumothorax. No pleural effusions, focal consolidations, pulmonary nodules or masses. Minimal lingular atelectasis. UPPER ABDOMEN: Included view of the abdomen status post cholecystectomy. Mild suspected hepatomegaly. MUSCULOSKELETAL: Visualized soft tissues and included osseous structures appear normal.  Scattered Schmorl's nodes. Review of the MIP images confirms the above findings. IMPRESSION: Technically limited examination due to bolus timing and habitus. Acute nonocclusive RIGHT segmental to subsegmental pulmonary emboli. CT evidence of right heart strain (RV/LV Ratio = 1) associated with an increased risk of morbidity and mortality. Please activate Code PE by paging [phone]. Critical Value/emergent results were called by telephone at the time of interpretation on 03/12/2016 at 7:15 pm to Dr. Hinda Kehr , who verbally acknowledged these results. Electronically Signed   By: Elon Alas M.D.   On: 03/12/2016 19:16   US Venous Img Lower Unilateral Right  Result Date: 02/24/2016 CLINICAL DATA:  Acute right calf pain for the past 12 hours. Evaluate for DVT. EXAM: RIGHT LOWER EXTREMITY VENOUS DOPPLER ULTRASOUND TECHNIQUE: Gray-scale sonography with graded compression, as well as color Doppler and duplex ultrasound were performed to evaluate the lower extremity deep venous systems from the level of the common femoral vein and including the common femoral, femoral, profunda femoral, popliteal and calf veins including the  posterior tibial, peroneal and gastrocnemius veins when visible. The superficial great saphenous vein was also interrogated. Spectral Doppler was utilized to evaluate flow at rest and with distal augmentation maneuvers in the common femoral, femoral and popliteal veins. COMPARISON:  None. FINDINGS: Examination is degraded due to patient body habitus and poor sonographic window. Contralateral Common Femoral Vein: Respiratory phasicity is normal and symmetric with the symptomatic side. No evidence of thrombus. Normal compressibility. Common Femoral Vein: No evidence of thrombus. Normal compressibility, respiratory phasicity and response to augmentation. Saphenofemoral Junction: No evidence of thrombus. Normal compressibility and flow on color Doppler imaging. Profunda Femoral Vein: No  evidence of thrombus. Normal compressibility and flow on color Doppler imaging. Femoral Vein: No evidence of thrombus. Normal compressibility, respiratory phasicity and response to augmentation. Popliteal Vein: There is a mixed echogenic occlusive thrombus within the distal aspect of the right popliteal vein, distal to the confluence of the lesser saphenous vein (image 31). Calf Veins: There is echogenic occlusive thrombus within the right posterior tibial vein (representative images 34 and 35). The peroneal vein is suboptimally imaged. Superficial Great Saphenous Vein: No evidence of thrombus. Normal compressibility and flow on color Doppler imaging. Venous Reflux:  None. Other Findings:  None. 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 Electronically Signed   By: Sandi Mariscal M.D.   On: 02/24/2016 16:56    Microbiology: Recent Results (from the past 240 hour(s))  MRSA PCR Screening     Status: None   Collection Time: 03/17/16 11:16 PM  Result Value Ref Range Status   MRSA by PCR NEGATIVE NEGATIVE Final    Comment:        The GeneXpert MRSA Assay (FDA approved for NASAL specimens only), is one component of a comprehensive MRSA colonization surveillance program. It is not intended to diagnose MRSA infection nor to guide or monitor treatment for MRSA infections.      Labs: CBC:  Recent Labs Lab 03/17/16 0513 03/17/16 1913 03/18/16 0724 03/19/16 0157 03/20/16 0307  WBC 8.6 9.4 8.1 7.3 8.0  NEUTROABS  --   --  5.3  --   --   HGB 9.3* 9.2* 8.9* 8.3* 8.5*  HCT 30.3* 31.1* 29.5* 28.3* 28.8*  MCV 68.8* 70.0* 71.1* 71.3* 72.2*  PLT 309 318 266 272 Q000111Q   Basic Metabolic Panel:  Recent Labs Lab 03/17/16 1913 03/18/16 0724 03/19/16 0157 03/20/16 0307  NA 138 139 137 137  K 3.9 3.7 3.7 4.5  CL 106 106 110 109  CO2 20*  25 21* 17*  GLUCOSE 119* 135* 116* 129*  BUN 7 8 7 8   CREATININE 0.79 0.77 0.77 0.78  CALCIUM 9.3 9.0 8.4* 8.5*   Liver Function Tests:  Recent Labs Lab 03/18/16 0724  AST 24  ALT 25  ALKPHOS 111  BILITOT 0.6  PROT 6.6  ALBUMIN 2.9*   No results for input(s): LIPASE, AMYLASE in the last 168 hours. No results for input(s): AMMONIA in the last 168 hours. Cardiac Enzymes:  Recent Labs Lab 03/18/16 0135 03/18/16 0724 03/18/16 1226  TROPONINI 1.35* <0.03 <0.03   BNP (last 3 results) No results for input(s): BNP in the last 8760 hours. CBG:  Recent Labs Lab 03/19/16 1309 03/19/16 1801 03/19/16 2138 03/20/16 0618 03/20/16 1118  GLUCAP 128* 106* 133* 104* 96   Time spent: 30 minutes  Signed:  Berle Mull  [facility]  03/20/2016 , 8:42 AM

## 2016-03-24 ENCOUNTER — Emergency Department: Payer: BC Managed Care – PPO

## 2016-03-24 ENCOUNTER — Inpatient Hospital Stay
Admission: EM | Admit: 2016-03-24 | Discharge: 2016-03-26 | DRG: 167 | Disposition: A | Discharge status: Home / Self Care | Payer: BC Managed Care – PPO | Attending: Internal Medicine | Admitting: Internal Medicine

## 2016-03-24 ENCOUNTER — Encounter: Payer: Self-pay | Admitting: Internal Medicine

## 2016-03-24 ENCOUNTER — Encounter: Payer: Self-pay | Admitting: Emergency Medicine

## 2016-03-24 DIAGNOSIS — E876 Hypokalemia: Secondary | ICD-10-CM

## 2016-03-24 DIAGNOSIS — I10 Essential (primary) hypertension: Secondary | ICD-10-CM | POA: Diagnosis present

## 2016-03-24 DIAGNOSIS — Z8051 Family history of malignant neoplasm of kidney: Secondary | ICD-10-CM

## 2016-03-24 DIAGNOSIS — Z79899 Other long term (current) drug therapy: Secondary | ICD-10-CM

## 2016-03-24 DIAGNOSIS — Z6841 Body Mass Index (BMI) 40.0 and over, adult: Secondary | ICD-10-CM

## 2016-03-24 DIAGNOSIS — E669 Obesity, unspecified: Secondary | ICD-10-CM | POA: Diagnosis present

## 2016-03-24 DIAGNOSIS — O2230 Deep phlebothrombosis in pregnancy, unspecified trimester: Secondary | ICD-10-CM

## 2016-03-24 DIAGNOSIS — F419 Anxiety disorder, unspecified: Secondary | ICD-10-CM | POA: Diagnosis present

## 2016-03-24 DIAGNOSIS — E8881 Metabolic syndrome: Secondary | ICD-10-CM | POA: Diagnosis present

## 2016-03-24 DIAGNOSIS — Z888 Allergy status to other drugs, medicaments and biological substances status: Secondary | ICD-10-CM

## 2016-03-24 DIAGNOSIS — Z809 Family history of malignant neoplasm, unspecified: Secondary | ICD-10-CM

## 2016-03-24 DIAGNOSIS — I82403 Acute embolism and thrombosis of unspecified deep veins of lower extremity, bilateral: Secondary | ICD-10-CM | POA: Diagnosis present

## 2016-03-24 DIAGNOSIS — Z7901 Long term (current) use of anticoagulants: Secondary | ICD-10-CM | POA: Diagnosis not present

## 2016-03-24 DIAGNOSIS — Z9049 Acquired absence of other specified parts of digestive tract: Secondary | ICD-10-CM

## 2016-03-24 DIAGNOSIS — D649 Anemia, unspecified: Secondary | ICD-10-CM | POA: Diagnosis present

## 2016-03-24 DIAGNOSIS — Z7984 Long term (current) use of oral hypoglycemic drugs: Secondary | ICD-10-CM

## 2016-03-24 DIAGNOSIS — E119 Type 2 diabetes mellitus without complications: Secondary | ICD-10-CM | POA: Diagnosis present

## 2016-03-24 DIAGNOSIS — R079 Chest pain, unspecified: Secondary | ICD-10-CM | POA: Diagnosis not present

## 2016-03-24 DIAGNOSIS — Z9889 Other specified postprocedural states: Secondary | ICD-10-CM

## 2016-03-24 DIAGNOSIS — Z803 Family history of malignant neoplasm of breast: Secondary | ICD-10-CM | POA: Diagnosis not present

## 2016-03-24 DIAGNOSIS — I82409 Acute embolism and thrombosis of unspecified deep veins of unspecified lower extremity: Secondary | ICD-10-CM | POA: Diagnosis not present

## 2016-03-24 DIAGNOSIS — I2699 Other pulmonary embolism without acute cor pulmonale: Secondary | ICD-10-CM | POA: Diagnosis not present

## 2016-03-24 HISTORY — DX: Type 2 diabetes mellitus without complications: E11.9

## 2016-03-24 LAB — APTT: aPTT: 57 seconds — ABNORMAL HIGH (ref 24–36)

## 2016-03-24 LAB — BASIC METABOLIC PANEL
Anion gap: 11 (ref 5–15)
BUN: 9 mg/dL (ref 6–20)
CHLORIDE: 103 mmol/L (ref 101–111)
CO2: 21 mmol/L — AB (ref 22–32)
CREATININE: 0.75 mg/dL (ref 0.44–1.00)
Calcium: 9.3 mg/dL (ref 8.9–10.3)
GFR calc non Af Amer: >60 mL/min (ref >60)
Glucose, Bld: 95 mg/dL (ref 65–99)
POTASSIUM: 3.5 mmol/L (ref 3.5–5.1)
Sodium: 135 mmol/L (ref 135–145)

## 2016-03-24 LAB — CBC
HEMATOCRIT: 32.8 % — AB (ref 35.0–47.0)
HEMOGLOBIN: 10.2 g/dL — AB (ref 12.0–16.0)
MCH: 21.5 pg — ABNORMAL LOW (ref 26.0–34.0)
MCHC: 31.2 g/dL — AB (ref 32.0–36.0)
MCV: 69 fL — ABNORMAL LOW (ref 80.0–100.0)
Platelets: 399 10*3/uL (ref 150–440)
RBC: 4.75 MIL/uL (ref 3.80–5.20)
RDW: 24.9 % — ABNORMAL HIGH (ref 11.5–14.5)
WBC: 9.6 10*3/uL (ref 3.6–11.0)

## 2016-03-24 LAB — PROTIME-INR
INR: 2.55
Prothrombin Time: 27.9 seconds — ABNORMAL HIGH (ref 11.4–15.2)

## 2016-03-24 LAB — POCT PREGNANCY, URINE: Preg Test, Ur: NEGATIVE

## 2016-03-24 LAB — PROTHROMBIN GENE MUTATION

## 2016-03-24 LAB — BRAIN NATRIURETIC PEPTIDE: B NATRIURETIC PEPTIDE 5: 30 pg/mL (ref 0.0–100.0)

## 2016-03-24 LAB — TROPONIN I
Troponin I: <0.03 ng/mL (ref <0.03)
Troponin I: <0.03 ng/mL (ref <0.03)

## 2016-03-24 LAB — FACTOR 5 LEIDEN

## 2016-03-24 LAB — GLUCOSE, CAPILLARY: Glucose-Capillary: 100 mg/dL — ABNORMAL HIGH (ref 65–99)

## 2016-03-24 IMAGING — CT CT ANGIO CHEST
2 of 6 series · 17 of 46 positions shown · IV contrast (isovue)
Comparison: Prior CT from [DATE]

CLINICAL DATA: Initial evaluation for acute chest pain. History of
recent PE.

EXAM:
CT ANGIOGRAPHY CHEST WITH CONTRAST
TECHNIQUE: Multidetector CT imaging of the chest was performed using the
standard protocol during bolus administration of intravenous
contrast. Multiplanar CT image reconstructions and MIPs were
obtained to evaluate the vascular anatomy.
CONTRAST:  75 cc of Isovue 370.

[Series 5: thins · axial · 0.79mm/px · z∈[-343,-76]mm · 15 of 293 slices shown]
[im 13/293  lung]
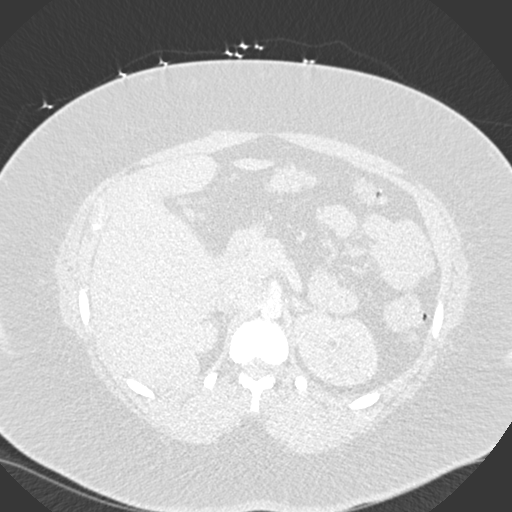
[im 39/293  soft-tissue]
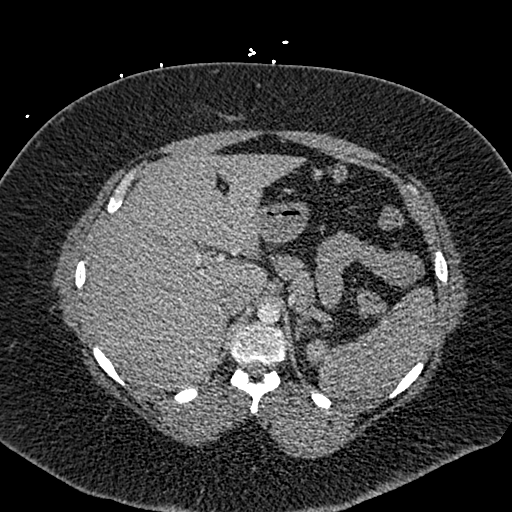
[im 51/293  lung]
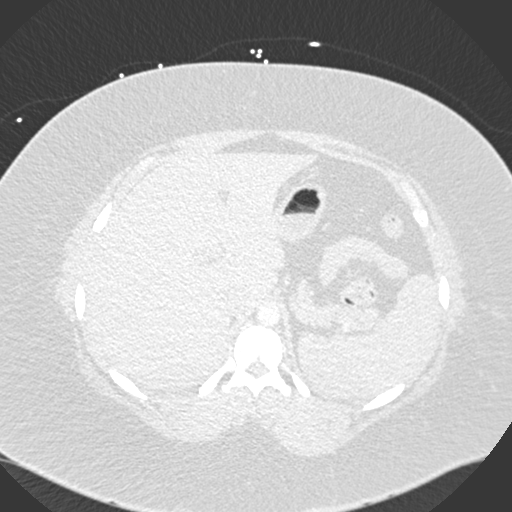
[im 77/293  soft-tissue]
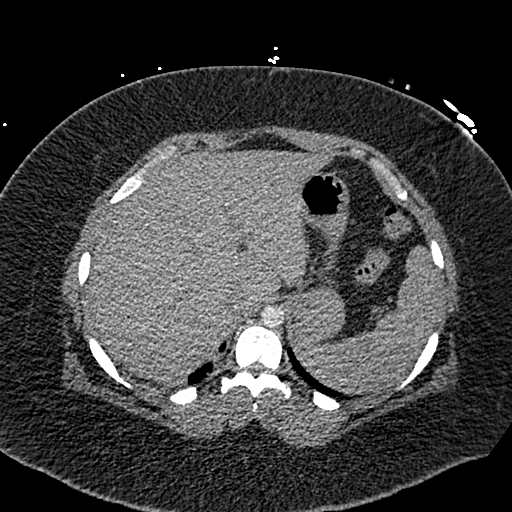
[im 89/293  lung]
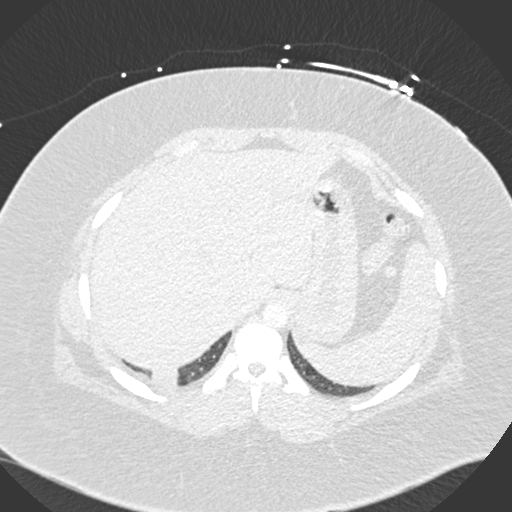
[im 115/293  soft-tissue]
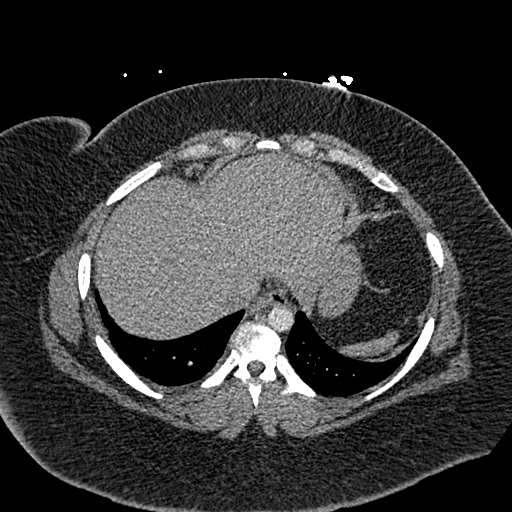
[im 127/293  lung]
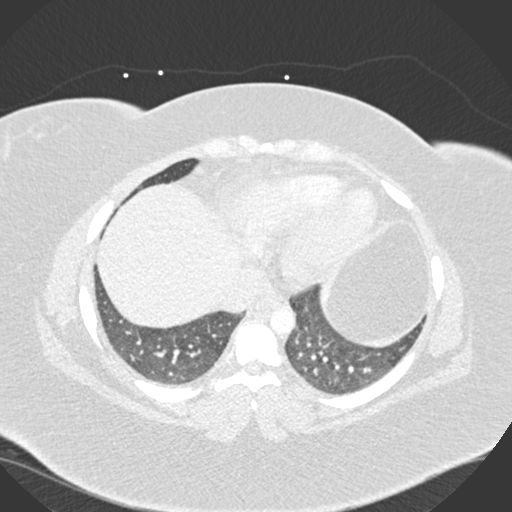
[im 153/293  soft-tissue]
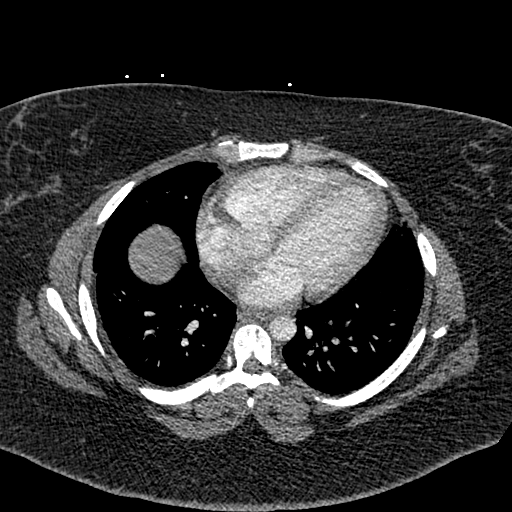
[im 166/293  lung]
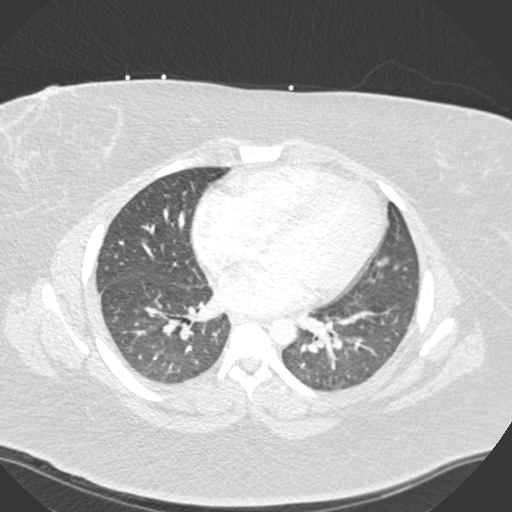
[im 178/293  soft-tissue]
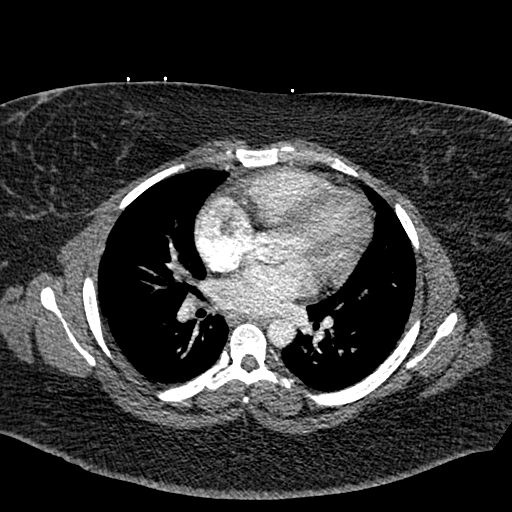
[im 204/293  lung]
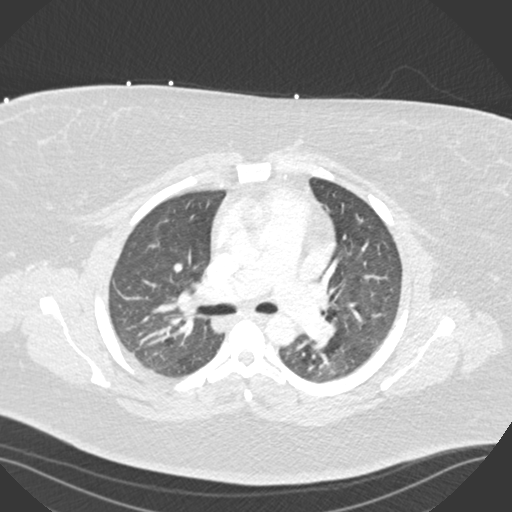
[im 216/293  soft-tissue]
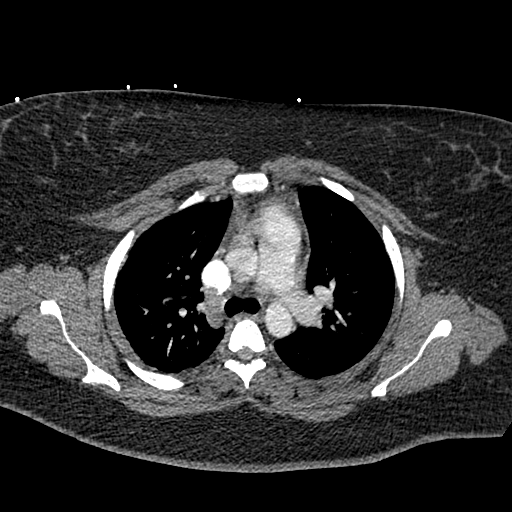
[im 242/293  lung]
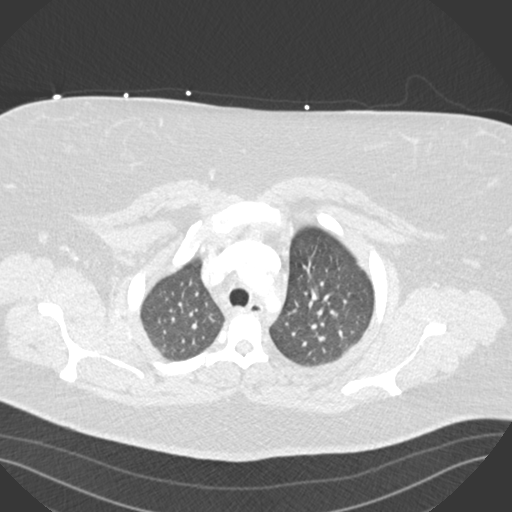
[im 254/293  soft-tissue]
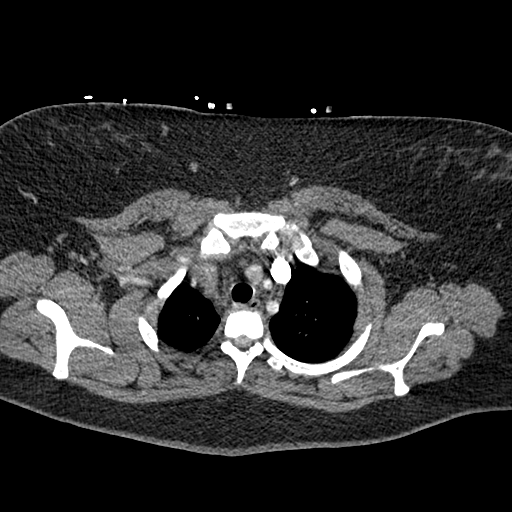
[im 280/293  lung]
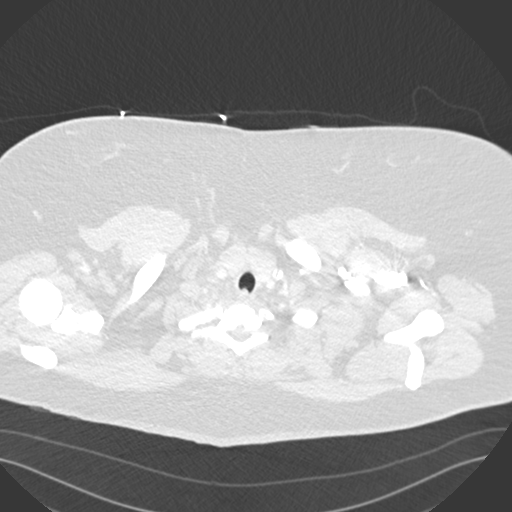

[Series 7: coronal mpr · coronal · 0.60mm/px · 2 of 91 slices shown]
[im 31/91  soft-tissue]
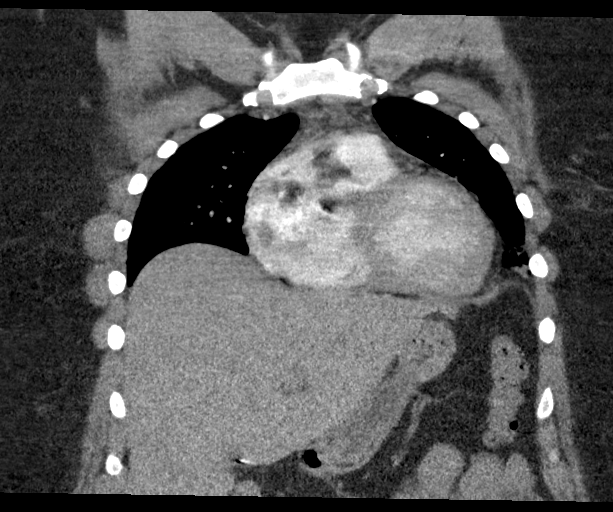
[im 61/91  soft-tissue]
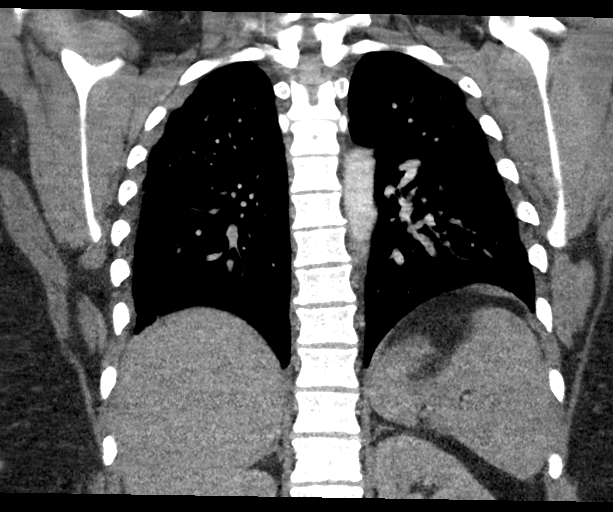

[17 of 46 positions shown; findings below may reference images not displayed]

FINDINGS: Cardiovascular: Intrathoracic aorta of normal caliber and appearance
without acute abnormality. Visualized great vessels within normal
limits. Heart size upper limits of normal. No pericardial effusion.

Main pulmonary artery at upper limits of normal for size measuring
2.9 cm in diameter. There are persistent subtle heterogeneous
filling defects within several segmental pulmonary artery supplying
the right lower lobe, most evident on coronal sequence (series 7,
image 60). These are nonocclusive. Additional residual clot within
segmental artery supplying the right middle lobe as well (series 7,
image 44). Few additional scattered possible filling defects noted
within segmental artery supplying the upper lobes as well (Series 9,
image 69 62 on the right, series 9, image 68 on the left. No
evidence for right heart strain on today's study (RV/LV ratio =
on today's).

Mediastinum/Nodes: Visualized thyroid is normal. No pathologically
enlarged mediastinal, hilar, or axillary lymph nodes. Esophagus
within normal limits.

Lungs/Pleura: Pleural-based parenchymal [place] at the posterior
reader right lung base, which may reflect an evolving pulmonary
infarct (series 6, image 68). Lungs are otherwise clear. No other
airspace consolidation. No pulmonary edema or pleural effusion. Mild
bibasilar air trapping. No pneumothorax. No other worrisome
pulmonary nodule or mass.

Upper Abdomen: Visualized upper abdomen within normal limits.
Patient is status post cholecystectomy.

Musculoskeletal: No acute osseous abnormality. No worrisome lytic or
blastic osseous lesions.

Review of the MIP images confirms the above findings.
IMPRESSION: 1. Persistent nonocclusive pulmonary emboli involving the right
middle and lower lobes as above, with additional possible tiny
nonocclusive emboli within the upper lobes as well. Overall, these
are largely subacute in appearance as compared to previous exam,
although the left sided clot would be new from previous. No evidence
for right heart strain on today's study.
2. Wedge-shaped opacity within the peripheral right lung base,
suspected to reflect an evolving pulmonary infarct.
Critical Value/emergent results were called by telephone at the time
of interpretation on [DATE] at [DATE] to Dr. [HOSPITAL] DELAROSA
, who verbally acknowledged these results.

## 2016-03-24 MED ORDER — IOPAMIDOL (ISOVUE-370) INJECTION 76%
75.0000 mL | Freq: Once | INTRAVENOUS | Status: AC | PRN
  Administered 2016-03-24: 75 mL via INTRAVENOUS

## 2016-03-24 MED ORDER — MORPHINE SULFATE (PF) 4 MG/ML IV SOLN
4.0000 mg | Freq: Once | INTRAVENOUS | Status: AC
  Administered 2016-03-24: 4 mg via INTRAVENOUS
  Filled 2016-03-24: qty 1

## 2016-03-24 MED ORDER — AMOXICILLIN-POT CLAVULANATE 875-125 MG PO TABS
1.0000 | ORAL_TABLET | Freq: Two times a day (BID) | ORAL | Status: DC
  Administered 2016-03-24 – 2016-03-26 (×4): 1 via ORAL
  Filled 2016-03-24 (×4): qty 1

## 2016-03-24 MED ORDER — ONDANSETRON HCL 4 MG/2ML IJ SOLN
4.0000 mg | Freq: Once | INTRAMUSCULAR | Status: AC
  Administered 2016-03-24: 4 mg via INTRAVENOUS
  Filled 2016-03-24: qty 2

## 2016-03-24 MED ORDER — HEPARIN (PORCINE) IN NACL 100-0.45 UNIT/ML-% IJ SOLN: 18.0000 [IU]/kg/h | Freq: Once | INTRAMUSCULAR | Status: DC

## 2016-03-24 MED ORDER — PANTOPRAZOLE SODIUM 40 MG PO TBEC
40.0000 mg | DELAYED_RELEASE_TABLET | Freq: Every day | ORAL | Status: DC
  Administered 2016-03-25 – 2016-03-26 (×2): 40 mg via ORAL
  Filled 2016-03-24 (×2): qty 1

## 2016-03-24 MED ORDER — ONDANSETRON HCL 4 MG/2ML IJ SOLN: 4.0000 mg | Freq: Four times a day (QID) | INTRAMUSCULAR | Status: DC | PRN

## 2016-03-24 MED ORDER — FOLIC ACID 1 MG PO TABS
1.0000 mg | ORAL_TABLET | Freq: Every day | ORAL | Status: DC
  Administered 2016-03-25 – 2016-03-26 (×2): 1 mg via ORAL
  Filled 2016-03-24 (×2): qty 1

## 2016-03-24 MED ORDER — METFORMIN HCL ER 500 MG PO TB24: 1000.0000 mg | ORAL_TABLET | Freq: Every day | ORAL | Status: DC

## 2016-03-24 MED ORDER — CLONAZEPAM 0.5 MG PO TABS
0.5000 mg | ORAL_TABLET | Freq: Two times a day (BID) | ORAL | Status: DC | PRN
  Filled 2016-03-24: qty 1

## 2016-03-24 MED ORDER — POLYETHYLENE GLYCOL 3350 17 G PO PACK
17.0000 g | PACK | Freq: Every day | ORAL | Status: DC
  Administered 2016-03-25 – 2016-03-26 (×2): 17 g via ORAL
  Filled 2016-03-24 (×2): qty 1

## 2016-03-24 MED ORDER — GABAPENTIN 300 MG PO CAPS
300.0000 mg | ORAL_CAPSULE | Freq: Three times a day (TID) | ORAL | Status: DC
  Administered 2016-03-24 – 2016-03-26 (×5): 300 mg via ORAL
  Filled 2016-03-24 (×4): qty 1

## 2016-03-24 MED ORDER — FERROUS SULFATE 325 (65 FE) MG PO TABS
325.0000 mg | ORAL_TABLET | Freq: Three times a day (TID) | ORAL | Status: DC
  Administered 2016-03-25 – 2016-03-26 (×4): 325 mg via ORAL
  Filled 2016-03-24 (×3): qty 1

## 2016-03-24 MED ORDER — ACETAMINOPHEN 650 MG RE SUPP: 650.0000 mg | Freq: Four times a day (QID) | RECTAL | Status: DC | PRN

## 2016-03-24 MED ORDER — INSULIN ASPART 100 UNIT/ML ~~LOC~~ SOLN
0.0000 [IU] | Freq: Three times a day (TID) | SUBCUTANEOUS | Status: DC
  Administered 2016-03-26 (×2): 1 [IU] via SUBCUTANEOUS
  Filled 2016-03-24 (×2): qty 1

## 2016-03-24 MED ORDER — GUAIFENESIN ER 600 MG PO TB12
600.0000 mg | ORAL_TABLET | Freq: Two times a day (BID) | ORAL | Status: DC
  Administered 2016-03-24 – 2016-03-26 (×4): 600 mg via ORAL
  Filled 2016-03-24 (×4): qty 1

## 2016-03-24 MED ORDER — ONDANSETRON HCL 4 MG PO TABS: 4.0000 mg | ORAL_TABLET | Freq: Four times a day (QID) | ORAL | Status: DC | PRN

## 2016-03-24 MED ORDER — ACETAZOLAMIDE 250 MG PO TABS
500.0000 mg | ORAL_TABLET | Freq: Two times a day (BID) | ORAL | Status: DC
  Administered 2016-03-24 – 2016-03-26 (×4): 500 mg via ORAL
  Filled 2016-03-24 (×4): qty 2

## 2016-03-24 MED ORDER — SODIUM CHLORIDE 0.9 % IV BOLUS (SEPSIS)
500.0000 mL | Freq: Once | INTRAVENOUS | Status: AC
  Administered 2016-03-24: 500 mL via INTRAVENOUS

## 2016-03-24 MED ORDER — INSULIN ASPART 100 UNIT/ML ~~LOC~~ SOLN: 0.0000 [IU] | Freq: Every day | SUBCUTANEOUS | Status: DC

## 2016-03-24 MED ORDER — HYDROCODONE-ACETAMINOPHEN 5-325 MG PO TABS
1.0000 | ORAL_TABLET | ORAL | Status: DC | PRN
  Administered 2016-03-25: 2 via ORAL
  Filled 2016-03-24: qty 2

## 2016-03-24 MED ORDER — ACETAMINOPHEN 325 MG PO TABS: 650.0000 mg | ORAL_TABLET | Freq: Four times a day (QID) | ORAL | Status: DC | PRN

## 2016-03-24 MED ORDER — HEPARIN (PORCINE) IN NACL 100-0.45 UNIT/ML-% IJ SOLN
2600.0000 [IU]/h | INTRAMUSCULAR | Status: DC
  Administered 2016-03-24: 1900 [IU]/h via INTRAVENOUS
  Administered 2016-03-25: 2200 [IU]/h via INTRAVENOUS
  Administered 2016-03-26: 2600 [IU]/h via INTRAVENOUS
  Filled 2016-03-24 (×5): qty 250

## 2016-03-24 MED ORDER — SODIUM CHLORIDE 0.9% FLUSH
3.0000 mL | Freq: Two times a day (BID) | INTRAVENOUS | Status: DC
  Administered 2016-03-25 – 2016-03-26 (×2): 3 mL via INTRAVENOUS

## 2016-03-24 NOTE — ED Notes (Addendum)
Pt reports that she was her two Thursdays ago and told she had a PE - released from [facility] Tuesday and readmitted Tuesday in [facility] for increased heart rate, decreased BP and states blood clots in right leg/ankle/calf and lung - c/o shortness of breath and chest pain - Dr Alfred Levins notified

## 2016-03-24 NOTE — ED Provider Notes (Signed)
[facility] Emergency Department Provider Note  ____________________________________________  Time seen: Approximately 5:40 PM  I have reviewed the triage vital signs and the nursing notes.   HISTORY  Chief Complaint Chest Pain   HPI Heather Cherry is a 30 y.o. female with a history of recently diagnosed DVT and PE currently on warfarin who presents for evaluation of chest pain. Patient was discharged from [facility] 4 days ago when she was admitted for new onset of PE. Patient was first diagnosed with DVT in the beginning of November and was started on Xarelto. She was then found to have PE and was started on heparin gtt and coumadin. Patient reports that she has been having intermittent right-sided pleuritic chest pain however this afternoon she was driving to work when she developed sudden onset of severe midline and left-sided chest pain, pleuritic, 9 out of 10, associated with shortness of breath. She reports that she has never had pain like this before. She denies dizziness, nausea, vomiting, diaphoresis. She has no personal or family history of ischemic heart disease. Patient endorses compliance with her Coumadin.  Past Medical History:  Diagnosis Date  . Abnormal vaginal bleeding   . Anxiety   . Depression   . Diabetes mellitus without complication ([place])   . DVT (deep venous thrombosis) ([place])   . Fibroid   . Hypertension   . Obesity   . Pulmonary emboli ([place])   . Sinus congestion     Patient Active Problem List   Diagnosis Date Noted  . Pseudotumor cerebri 03/19/2016  . Pleuritic chest pain 03/18/2016  . Chest pain 03/18/2016  . Right pulmonary embolus ([place]) 03/17/2016  . PE (pulmonary thromboembolism) ([place]) 03/12/2016  . DVT (deep venous thrombosis) ([place]) 03/12/2016  . HTN (hypertension) 03/12/2016  . Depression 03/12/2016  . Anxiety 03/12/2016  . GERD (gastroesophageal reflux disease) 03/12/2016  . Hyperglycemia 03/12/2016    Past  Surgical History:  Procedure Laterality Date  . CHOLECYSTECTOMY    . WISDOM TOOTH EXTRACTION      Prior to Admission medications   Medication Sig Start Date End Date Taking? Authorizing Provider  acetaZOLAMIDE (DIAMOX) 250 MG tablet Take 500 mg by mouth 2 (two) times daily.   Yes Historical Provider, MD  amoxicillin-clavulanate (AUGMENTIN) 875-125 MG tablet Take 1 tablet by mouth every 12 (twelve) hours. 03/20/16 03/25/16 Yes Lavina Hamman, MD  ferrous sulfate 325 (65 FE) MG tablet Take 325 mg by mouth 3 (three) times daily with meals.   Yes Historical Provider, MD  folic acid (FOLVITE) 1 MG tablet Take 1 tablet (1 mg total) by mouth daily. 03/20/16  Yes Lavina Hamman, MD  gabapentin (NEURONTIN) 300 MG capsule Take 300 mg by mouth 3 (three) times daily.   Yes Historical Provider, MD  HYDROcodone-acetaminophen (NORCO/VICODIN) 5-325 MG tablet Take 1-2 tablets by mouth every 4 (four) hours as needed for moderate pain. 03/17/16  Yes Dustin Flock, MD  metFORMIN (GLUCOPHAGE-XR) 500 MG 24 hr tablet Take 1,000 mg by mouth daily with breakfast.   Yes Historical Provider, MD  omeprazole (PRILOSEC) 20 MG capsule Take 20 mg by mouth daily.   Yes Historical Provider, MD  polyethylene glycol (MIRALAX / GLYCOLAX) packet Take 17 g by mouth daily. 03/20/16  Yes Lavina Hamman, MD  warfarin (COUMADIN) 5 MG tablet Take 5 mg by mouth daily. 5mg  & 7.5mg  daily alternating dose   Yes Historical Provider, MD  warfarin (COUMADIN) 7.5 MG tablet Take 1 tablet (7.5 mg total)  by mouth daily. 03/17/16  Yes Dustin Flock, MD  clonazePAM (KLONOPIN) 0.5 MG tablet Take 0.5 mg by mouth 2 (two) times daily as needed for anxiety.    Historical Provider, MD  guaiFENesin (MUCINEX) 600 MG 12 hr tablet Take 1 tablet (600 mg total) by mouth 2 (two) times daily. Patient not taking: Reported on 03/24/2016 03/20/16   Lavina Hamman, MD    Allergies Lisinopril  Family History  Problem Relation Age of Onset  . Cancer Paternal  Grandfather   . Rheumatologic disease Neg Hx     Social History Social History  Substance Use Topics  . Smoking status: Never Smoker  . Smokeless tobacco: Never Used  . Alcohol use Yes     Comment: occas. social    Review of Systems  Constitutional: Negative for fever. Eyes: Negative for visual changes. ENT: Negative for sore throat. Neck: No neck pain  Cardiovascular: + chest pain. Respiratory: + shortness of breath. Gastrointestinal: Negative for abdominal pain, vomiting or diarrhea. Genitourinary: Negative for dysuria. Musculoskeletal: Negative for back pain. Skin: Negative for rash. Neurological: Negative for headaches, weakness or numbness. Psych: No SI or HI  ____________________________________________   PHYSICAL EXAM:  VITAL SIGNS: ED Triage Vitals  Enc Vitals Group     BP 03/24/16 1656 (!) 145/75     Pulse Rate 03/24/16 1656 95     Resp 03/24/16 1656 18     Temp 03/24/16 1656 98.2 F (36.8 C)     Temp Source 03/24/16 1656 Oral     SpO2 03/24/16 1656 100 %     Weight 03/24/16 1657 (!) 377 lb (171 kg)     Height --      Head Circumference --      Peak Flow --      Pain Score 03/24/16 1657 10     Pain Loc --      Pain Edu? --      Excl. in [place]? --     Constitutional: Alert and oriented. Well appearing and in no apparent distress. HEENT:      Head: Normocephalic and atraumatic.         Eyes: Conjunctivae are normal. Sclera is non-icteric. EOMI. PERRL      Mouth/Throat: Mucous membranes are moist.       Neck: Supple with no signs of meningismus. Cardiovascular: Regular rate and rhythm. No murmurs, gallops, or rubs. 2+ symmetrical distal pulses are present in all extremities. No JVD. Respiratory: Normal respiratory effort. Lungs are clear to auscultation bilaterally. No wheezes, crackles, or rhonchi.  Gastrointestinal: Soft, non tender, and non distended with positive bowel sounds. No rebound or guarding. Genitourinary: No CVA  tenderness. Musculoskeletal: Asymmetric b/l LE R>L, no pitting edema. Neurologic: Normal speech and language. Face is symmetric. Moving all extremities. No gross focal neurologic deficits are appreciated. Skin: Skin is warm, dry and intact. No rash noted. Psychiatric: Mood and affect are normal. Speech and behavior are normal.  ____________________________________________   LABS (all labs ordered are listed, but only abnormal results are displayed)  Labs Reviewed  PROTIME-INR - Abnormal; Notable for the following:       Result Value   Prothrombin Time 27.9 (*)    All other components within normal limits  CBC - Abnormal; Notable for the following:    Hemoglobin 10.2 (*)    HCT 32.8 (*)    MCV 69.0 (*)    MCH 21.5 (*)    MCHC 31.2 (*)    RDW 24.9 (*)  All other components within normal limits  BASIC METABOLIC PANEL - Abnormal; Notable for the following:    CO2 21 (*)    All other components within normal limits  TROPONIN I  BRAIN NATRIURETIC PEPTIDE  POCT PREGNANCY, URINE   ____________________________________________  EKG  ED ECG REPORT I, Rudene Re, the attending physician, personally viewed and interpreted this ECG.  Normal sinus rhythm, rate of 98, normal intervals, normal axis, no ST elevations or depressions, nonspecific T-wave abnormalities in the inferior and lateral leads ____________________________________________  RADIOLOGY  CTA chest: 1. Persistent nonocclusive pulmonary emboli involving the right middle and lower lobes as above, with additional possible tiny nonocclusive emboli within the upper lobes as well. Overall, these are largely subacute in appearance as compared to previous exam, although the left sided clot would be new from previous. No evidence for right heart strain on today's study. 2. Wedge-shaped opacity within the peripheral right lung base, suspected to reflect an evolving pulmonary  infarct. ____________________________________________   PROCEDURES  Procedure(s) performed: None Procedures Critical Care performed: yes  CRITICAL CARE Performed by: Rudene Re  ?  Total critical care time: 35 min  Critical care time was exclusive of separately billable procedures and treating other patients.  Critical care was necessary to treat or prevent imminent or life-threatening deterioration.  Critical care was time spent personally by me on the following activities: development of treatment plan with patient and/or surrogate as well as nursing, discussions with consultants, evaluation of patient's response to treatment, examination of patient, obtaining history from patient or surrogate, ordering and performing treatments and interventions, ordering and review of laboratory studies, ordering and review of radiographic studies, pulse oximetry and re-evaluation of patient's condition.  ____________________________________________   INITIAL IMPRESSION / ASSESSMENT AND PLAN / ED COURSE  30 y.o. female with a history of recently diagnosed DVT and PE currently on warfarin who presents for evaluation of new severe pleuritic left sided chest pain. Patient is hemodynamically stable. EKG with no evidence of ischemia. Presentation concerning for possible worsening clot burden versus new clot. We'll pursue CT of her chest. We'll watch patient for telemetry. Will check INR.  Clinical Course as of Mar 24 1956  Tue Mar 24, 2016  1931 CT of the chest showing evolving pulmonary infarct and also small new clots on the left lung. INR is within therapeutic range. Patient will be started on heparin gtt and will be admitted.  [CV]    Clinical Course User Index [CV] Rudene Re, MD    Pertinent labs & imaging results that were available during my care of the patient were reviewed by me and considered in my medical decision making (see chart for  details).    ____________________________________________   FINAL CLINICAL IMPRESSION(S) / ED DIAGNOSES  Final diagnoses:  Other acute pulmonary embolism without acute cor pulmonale (HCC)  Pulmonary infarction (HCC)      NEW MEDICATIONS STARTED DURING THIS VISIT:  New Prescriptions   No medications on file     Note:  This document was prepared using Dragon voice recognition software and may include unintentional dictation errors.    Rudene Re, MD 03/24/16 [phone]

## 2016-03-24 NOTE — H&P (Addendum)
[place] [facility] [place] NAME: Heather Cherry    MR#:  [id number]  DATE OF BIRTH:  Aug 08, 1985  DATE OF ADMISSION:  03/24/2016  PRIMARY CARE PHYSICIAN: Suzanna Obey, MD   REQUESTING/REFERRING PHYSICIAN: Alfred Levins, MD  CHIEF COMPLAINT:  Chest pain  HISTORY OF PRESENT ILLNESS:  Heather Cherry  is a 29 y.o. female with a known history of Recently diagnosed lower extremity DVT was on Xarelto which was discontinued after she was diagnosed with a pulmonary embolism lately and was started on Coumadin is presenting to the emergency department for evaluation of chest pain. Patient was just admitted to [facility] 4 days ago for new onset pulmonary embolism and discharged home with Coumadin. INR is at 2.5. Patient was diagnosed with DVT of lower extremities during November and was started on Xarelto which was discontinued after starting Coumadin for pulmonary embolism 4 days ago. Patient reports that sudden onset of midline that sided chest pain worsens with inspiration, associated with shortness of breath. Denies any dizziness nausea vomiting or less of consciousness. 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 Patient was just seen by [facility] oncologist and some coagulation workup was done and results are pending  Patient is a started on heparin drip. CT angiogram Persistent nonocclusive pulmonary emboli involving the right middle and lower lobes as above, with additional possible tiny nonocclusive emboli within the upper lobes as well. Overall, these are largely subacute in appearance as compared to previous exam, although the left sided clot would be new from previous. No evidence for right heart strain on today's study    PAST MEDICAL HISTORY:   Past Medical History:  Diagnosis Date  . Abnormal vaginal bleeding   . Anxiety   . Depression   . Diabetes mellitus without complication ([place])   . DVT (deep venous  thrombosis) ([place])   . Fibroid   . Hypertension   . Obesity   . Pulmonary emboli ([place])   . Sinus congestion     PAST SURGICAL HISTOIRY:   Past Surgical History:  Procedure Laterality Date  . CHOLECYSTECTOMY    . WISDOM TOOTH EXTRACTION      SOCIAL HISTORY:   Social History  Substance Use Topics  . Smoking status: Never Smoker  . Smokeless tobacco: Never Used  . Alcohol use Yes     Comment: occas. social    FAMILY HISTORY:   Family History  Problem Relation Age of Onset  . Cancer Paternal Grandfather   . Rheumatologic disease Neg Hx     DRUG ALLERGIES:   Allergies  Allergen Reactions  . Lisinopril Cough    REVIEW OF SYSTEMS:  CONSTITUTIONAL: No fever, fatigue or weakness.  EYES: No blurred or double vision.  EARS, NOSE, AND THROAT: No tinnitus or ear pain.  RESPIRATORY: No cough, shortness of breath, wheezing or hemoptysis.  CARDIOVASCULAR: Reporting right-sided  chest pain,  and shortness of breath. Denies orthopnea, edema.  GASTROINTESTINAL: No nausea, vomiting, diarrhea or abdominal pain.  GENITOURINARY: No dysuria, hematuria.  ENDOCRINE: No polyuria, nocturia,  HEMATOLOGY: No anemia, easy bruising or bleeding SKIN: No rash or lesion. MUSCULOSKELETAL: No joint pain or arthritis.   NEUROLOGIC: No tingling, numbness, weakness.  PSYCHIATRY: No anxiety or depression.   MEDICATIONS AT HOME:   Prior to Admission medications   Medication Sig Start Date End Date Taking? Authorizing Provider  acetaZOLAMIDE (DIAMOX) 250 MG tablet Take 500 mg by mouth 2 (two) times daily.  Yes Historical Provider, MD  amoxicillin-clavulanate (AUGMENTIN) 875-125 MG tablet Take 1 tablet by mouth every 12 (twelve) hours. 03/20/16 03/25/16 Yes Lavina Hamman, MD  ferrous sulfate 325 (65 FE) MG tablet Take 325 mg by mouth 3 (three) times daily with meals.   Yes Historical Provider, MD  folic acid (FOLVITE) 1 MG tablet Take 1 tablet (1 mg total) by mouth daily. 03/20/16  Yes Lavina Hamman, MD  gabapentin (NEURONTIN) 300 MG capsule Take 300 mg by mouth 3 (three) times daily.   Yes Historical Provider, MD  HYDROcodone-acetaminophen (NORCO/VICODIN) 5-325 MG tablet Take 1-2 tablets by mouth every 4 (four) hours as needed for moderate pain. 03/17/16  Yes Dustin Flock, MD  metFORMIN (GLUCOPHAGE-XR) 500 MG 24 hr tablet Take 1,000 mg by mouth daily with breakfast.   Yes Historical Provider, MD  omeprazole (PRILOSEC) 20 MG capsule Take 20 mg by mouth daily.   Yes Historical Provider, MD  polyethylene glycol (MIRALAX / GLYCOLAX) packet Take 17 g by mouth daily. 03/20/16  Yes Lavina Hamman, MD  warfarin (COUMADIN) 5 MG tablet Take 5 mg by mouth daily. 5mg  & 7.5mg  daily alternating dose   Yes Historical Provider, MD  warfarin (COUMADIN) 7.5 MG tablet Take 1 tablet (7.5 mg total) by mouth daily. 03/17/16  Yes Dustin Flock, MD  clonazePAM (KLONOPIN) 0.5 MG tablet Take 0.5 mg by mouth 2 (two) times daily as needed for anxiety.    Historical Provider, MD  guaiFENesin (MUCINEX) 600 MG 12 hr tablet Take 1 tablet (600 mg total) by mouth 2 (two) times daily. Patient not taking: Reported on 03/24/2016 03/20/16   Lavina Hamman, MD      VITAL SIGNS:  Blood pressure 114/63, pulse 88, temperature 98.2 F (36.8 C), temperature source Oral, resp. rate 20, weight (!) 171 kg (377 lb), last menstrual period 02/27/2016, SpO2 100 %.  PHYSICAL EXAMINATION:  GENERAL:  30 y.o.-year-old patient lying in the bed with no acute distress. Morbidly obese EYES: Pupils equal, round, reactive to light and accommodation. No scleral icterus. Extraocular muscles intact.  HEENT: Head atraumatic, normocephalic. Oropharynx and nasopharynx clear.  NECK:  Supple, no jugular venous distention. No thyroid enlargement, no tenderness.  LUNGS: Normal breath sounds bilaterally, no wheezing, rales,rhonchi or crepitation. No use of accessory muscles of respiration.  no reproducible anterior chest wall pain CARDIOVASCULAR: S1,  S2 normal. No murmurs, rubs, or gallops.  ABDOMEN: Soft, nontender, nondistended. Bowel sounds present. No organomegaly or mass.  EXTREMITIES: No pedal edema, cyanosis, or clubbing.  NEUROLOGIC: Cranial nerves II through XII are intact. Muscle strength 5/5 in all extremities. Sensation intact. Gait not checked.  PSYCHIATRIC: The patient is alert and oriented x 3.  SKIN: No obvious rash, lesion, or ulcer.   LABORATORY PANEL:   CBC  Recent Labs Lab 03/24/16 1720  WBC 9.6  HGB 10.2*  HCT 32.8*  PLT 399   ------------------------------------------------------------------------------------------------------------------  Chemistries   Recent Labs Lab 03/18/16 0724  03/24/16 1720  NA 139  < > 135  K 3.7  < > 3.5  CL 106  < > 103  CO2 25  < > 21*  GLUCOSE 135*  < > 95  BUN 8  < > 9  CREATININE 0.77  < > 0.75  CALCIUM 9.0  < > 9.3  AST 24  --   --   ALT 25  --   --   ALKPHOS 111  --   --   BILITOT 0.6  --   --   < > =  values in this interval not displayed. ------------------------------------------------------------------------------------------------------------------  Cardiac Enzymes  Recent Labs Lab 03/24/16 1720  TROPONINI <0.03   ------------------------------------------------------------------------------------------------------------------  RADIOLOGY:  Ct Angio Chest Pe W And/or Wo Contrast  Result Date: 03/24/2016 CLINICAL DATA:  Initial evaluation for acute chest pain. History of recent PE. EXAM: CT ANGIOGRAPHY CHEST WITH CONTRAST TECHNIQUE: Multidetector CT imaging of the chest was performed using the standard protocol during bolus administration of intravenous contrast. Multiplanar CT image reconstructions and MIPs were obtained to evaluate the vascular anatomy. CONTRAST:  75 cc of Isovue 370. COMPARISON:  Prior CT from 03/12/2016 FINDINGS: Cardiovascular: Intrathoracic aorta of normal caliber and appearance without acute abnormality. Visualized great vessels  within normal limits. Heart size upper limits of normal. No pericardial effusion. Main pulmonary artery at upper limits of normal for size measuring 2.9 cm in diameter. There are persistent subtle heterogeneous filling defects within several segmental pulmonary artery supplying the right lower lobe, most evident on coronal sequence (series 7, image 60). These are nonocclusive. Additional residual clot within segmental artery supplying the right middle lobe as well (series 7, image 44). Few additional scattered possible filling defects noted within segmental artery supplying the upper lobes as well (Series 9, image 69 62 on the right, series 9, image 68 on the left. No evidence for right heart strain on today's study (RV/LV ratio = 0.8 on today's). Mediastinum/Nodes: Visualized thyroid is normal. No pathologically enlarged mediastinal, hilar, or axillary lymph nodes. Esophagus within normal limits. Lungs/Pleura: Pleural-based parenchymal [place] at the posterior reader right lung base, which may reflect an evolving pulmonary infarct (series 6, image 68). Lungs are otherwise clear. No other airspace consolidation. No pulmonary edema or pleural effusion. Mild bibasilar air trapping. No pneumothorax. No other worrisome pulmonary nodule or mass. Upper Abdomen: Visualized upper abdomen within normal limits. Patient is status post cholecystectomy. Musculoskeletal: No acute osseous abnormality. No worrisome lytic or blastic osseous lesions. Review of the MIP images confirms the above findings. IMPRESSION: 1. Persistent nonocclusive pulmonary emboli involving the right middle and lower lobes as above, with additional possible tiny nonocclusive emboli within the upper lobes as well. Overall, these are largely subacute in appearance as compared to previous exam, although the left sided clot would be new from previous. No evidence for right heart strain on today's study. 2. Wedge-shaped opacity within the peripheral right  lung base, suspected to reflect an evolving pulmonary infarct. Critical Value/emergent results were called by telephone at the time of interpretation on 03/24/2016 at 7:23 pm to Dr. Rudene Re , who verbally acknowledged these results. Electronically Signed   By: Jeannine Boga M.D.   On: 03/24/2016 19:28    EKG:   Orders placed or performed during the hospital encounter of 03/24/16  . EKG 12-Lead  . EKG 12-Lead    IMPRESSION AND PLAN:   Jeanea Barca  is a 30 y.o. female with a known history of Recently diagnosed lower extremity DVT was on Xarelto which was discontinued after she was diagnosed with a pulmonary embolism lately and was started on Coumadin is presenting to the emergency department for evaluation of chest pain. Patient was just admitted to [facility] 4 days ago for new onset pulmonary embolism and discharged home with Coumadin. INR is at 2.5.   #pleuritic chest pain secondary to new evolving left-sided pulmonary embolism and persistent right-sided clots  admit to telemetry Patient is started on heparin drip Hemodynamically stable at this time Heme oncology consult is placed for further recommendations and  workup as needed Holding Coumadin at this time Norco as needed for pain Consult vascular surgery for possible IVC filter placement call placed to Dr. dew  #Essential hypertension  continue Diamox  #Diabetes mellitus Continue home medication metformin and sliding scale insulin  #Anxiety   continue home medication Klonopin   #Morbid obesity with metabolic syndrome Patient needs lifestyle changes once she is medically stable   DVT prophylaxis on heparin drip  All the records are reviewed and case discussed with ED provider. Management plans discussed with the patient, family and they are in agreement.  CODE STATUS: fc, Heather Cherry is the HCPOA  TOTAL TIME TAKING CARE OF THIS PATIENT: 45  minutes.   Note: This dictation was prepared with Dragon  dictation along with smaller phrase technology. Any transcriptional errors that result from this process are unintentional.  Nicholes Mango M.D on 03/24/2016 at 9:21 PM  Between 7am to 6pm - Pager - [phone]  After 6pm go to [email or web address] - password [email or web address] [facility]  [facility]  Office  [phone]  CC: Primary care physician; Suzanna Obey, MD

## 2016-03-24 NOTE — ED Triage Notes (Signed)
Pt to ed with c/o chest pain.  Pt states she knows she has a PE was seen and admitted and d/c from [facility] on Friday.  Was told if pain gets worse to come back.  Pt states pain is worse today.  Pt states she is currently on coumadin for blood thinner.  Last INR yesterday was 3.7.

## 2016-03-24 NOTE — ED Notes (Addendum)
Called to verify with lab that they had enough blood to run APTT - they advised they would call back if they did not

## 2016-03-24 NOTE — Consult Note (Signed)
ANTICOAGULATION CONSULT NOTE - Initial Consult  Pharmacy Consult for heparin drip Indication: pulmonary embolus  Allergies  Allergen Reactions  . Lisinopril Cough    Patient Measurements: Weight: (!) 377 lb (171 kg) Heparin Dosing Weight: 109kg  Vital Signs: Temp: 98.2 F (36.8 C) (12/12 1656) Temp Source: Oral (12/12 1656) BP: 145/64 (12/12 1808) Pulse Rate: 97 (12/12 1808)  Labs:  Recent Labs  03/24/16 1720  HGB 10.2*  HCT 32.8*  PLT 399  LABPROT 27.9*  INR 2.55  CREATININE 0.75  TROPONINI <0.03    Estimated Creatinine Clearance: 177.1 mL/min (by C-G formula based on SCr of 0.75 mg/dL).   Medical History: Past Medical History:  Diagnosis Date  . Abnormal vaginal bleeding   . Anxiety   . Depression   . Diabetes mellitus without complication ([place])   . DVT (deep venous thrombosis) ([place])   . Fibroid   . Hypertension   . Obesity   . Pulmonary emboli ([place])   . Sinus congestion     Medications:  Scheduled:    Assessment: Pt is a 30 year old female recently diagnosed with a DVT and PE, currently on warfarin (therapeutic level) who presents with chest pain. Pharmacy is consulted to start heparin drip with concerns over possible worsening clot burden/new clots. Will not bolus since pt INR is therapeutic (2.55)  Goal of Therapy:  Heparin level 0.3-0.7 units/ml Monitor platelets by anticoagulation protocol: Yes   Plan:  Start heparin infusion at 1900 units/hr Check anti-Xa level in 6 hours and daily while on heparin Continue to monitor H&H and platelets  Oziel Beitler D Endre Coutts 03/24/2016,8:17 PM

## 2016-03-25 ENCOUNTER — Encounter
Admission: EM | Disposition: A | Discharge status: Home / Self Care | Payer: Self-pay | Source: Home / Self Care | Attending: Internal Medicine

## 2016-03-25 DIAGNOSIS — I82409 Acute embolism and thrombosis of unspecified deep veins of unspecified lower extremity: Secondary | ICD-10-CM

## 2016-03-25 DIAGNOSIS — I10 Essential (primary) hypertension: Secondary | ICD-10-CM

## 2016-03-25 DIAGNOSIS — R079 Chest pain, unspecified: Secondary | ICD-10-CM

## 2016-03-25 DIAGNOSIS — I2699 Other pulmonary embolism without acute cor pulmonale: Secondary | ICD-10-CM

## 2016-03-25 HISTORY — PX: PERIPHERAL VASCULAR CATHETERIZATION: SHX172C

## 2016-03-25 LAB — COMPREHENSIVE METABOLIC PANEL
ALBUMIN: 3.2 g/dL — AB (ref 3.5–5.0)
ALK PHOS: 69 U/L (ref 38–126)
ALT: 11 U/L — AB (ref 14–54)
AST: 14 U/L — ABNORMAL LOW (ref 15–41)
Anion gap: 7 (ref 5–15)
BUN: 8 mg/dL (ref 6–20)
CALCIUM: 8.9 mg/dL (ref 8.9–10.3)
CHLORIDE: 106 mmol/L (ref 101–111)
CO2: 23 mmol/L (ref 22–32)
CREATININE: 0.57 mg/dL (ref 0.44–1.00)
GFR calc Af Amer: >60 mL/min (ref >60)
GFR calc non Af Amer: >60 mL/min (ref >60)
Glucose, Bld: 149 mg/dL — ABNORMAL HIGH (ref 65–99)
Potassium: 3 mmol/L — ABNORMAL LOW (ref 3.5–5.1)
SODIUM: 136 mmol/L (ref 135–145)
Total Bilirubin: 0.7 mg/dL (ref 0.3–1.2)
Total Protein: 6.8 g/dL (ref 6.5–8.1)

## 2016-03-25 LAB — PROTIME-INR
INR: 2.54
PROTHROMBIN TIME: 27.8 s — AB (ref 11.4–15.2)

## 2016-03-25 LAB — CBC
HCT: 28.7 % — ABNORMAL LOW (ref 35.0–47.0)
HEMOGLOBIN: 9.1 g/dL — AB (ref 12.0–16.0)
MCH: 22.1 pg — AB (ref 26.0–34.0)
MCHC: 31.6 g/dL — AB (ref 32.0–36.0)
MCV: 70 fL — AB (ref 80.0–100.0)
PLATELETS: 381 10*3/uL (ref 150–440)
RBC: 4.1 MIL/uL (ref 3.80–5.20)
RDW: 24.3 % — ABNORMAL HIGH (ref 11.5–14.5)
WBC: 7.1 10*3/uL (ref 3.6–11.0)

## 2016-03-25 LAB — GLUCOSE, CAPILLARY
GLUCOSE-CAPILLARY: 124 mg/dL — AB (ref 65–99)
GLUCOSE-CAPILLARY: 136 mg/dL — AB (ref 65–99)
Glucose-Capillary: 107 mg/dL — ABNORMAL HIGH (ref 65–99)
Glucose-Capillary: 100 mg/dL — ABNORMAL HIGH (ref 65–99)

## 2016-03-25 LAB — HEPARIN LEVEL (UNFRACTIONATED)
Heparin Unfractionated: <0.1 IU/mL — ABNORMAL LOW (ref 0.30–0.70)
Heparin Unfractionated: 0.24 IU/mL — ABNORMAL LOW (ref 0.30–0.70)

## 2016-03-25 LAB — TSH: TSH: 1.627 u[IU]/mL (ref 0.350–4.500)

## 2016-03-25 LAB — TROPONIN I
Troponin I: <0.03 ng/mL (ref <0.03)
Troponin I: <0.03 ng/mL (ref <0.03)

## 2016-03-25 SURGERY — IVC FILTER INSERTION
Anesthesia: Moderate Sedation

## 2016-03-25 MED ORDER — LIDOCAINE HCL (PF) 1 % IJ SOLN
INTRAMUSCULAR | Status: AC
  Filled 2016-03-25: qty 30

## 2016-03-25 MED ORDER — KCL IN DEXTROSE-NACL 20-5-0.45 MEQ/L-%-% IV SOLN
INTRAVENOUS | Status: DC
  Administered 2016-03-25: 10:00:00 via INTRAVENOUS
  Filled 2016-03-25 (×2): qty 1000

## 2016-03-25 MED ORDER — FENTANYL CITRATE (PF) 100 MCG/2ML IJ SOLN
INTRAMUSCULAR | Status: AC
  Filled 2016-03-25: qty 2

## 2016-03-25 MED ORDER — POTASSIUM CHLORIDE CRYS ER 20 MEQ PO TBCR
20.0000 meq | EXTENDED_RELEASE_TABLET | Freq: Every day | ORAL | Status: DC
  Administered 2016-03-26: 20 meq via ORAL
  Filled 2016-03-25: qty 1

## 2016-03-25 MED ORDER — MIDAZOLAM HCL 2 MG/2ML IJ SOLN
INTRAMUSCULAR | Status: DC | PRN
  Administered 2016-03-25: 1 mg via INTRAVENOUS
  Administered 2016-03-25: 2 mg via INTRAVENOUS
  Administered 2016-03-25: 1 mg via INTRAVENOUS

## 2016-03-25 MED ORDER — IOPAMIDOL (ISOVUE-300) INJECTION 61%
INTRAVENOUS | Status: DC | PRN
Start: 2016-03-25 — End: 2016-03-25
  Administered 2016-03-25: 10 mL via INTRA_ARTERIAL

## 2016-03-25 MED ORDER — SODIUM CHLORIDE 0.9 % IV SOLN
INTRAVENOUS | Status: DC
  Administered 2016-03-25: 1000 mL via INTRAVENOUS

## 2016-03-25 MED ORDER — FENTANYL CITRATE (PF) 100 MCG/2ML IJ SOLN
INTRAMUSCULAR | Status: DC | PRN
  Administered 2016-03-25 (×3): 50 ug via INTRAVENOUS

## 2016-03-25 MED ORDER — NITROGLYCERIN 0.4 MG SL SUBL
0.4000 mg | SUBLINGUAL_TABLET | SUBLINGUAL | Status: DC | PRN
  Administered 2016-03-25 (×3): 0.4 mg via SUBLINGUAL
  Filled 2016-03-25: qty 1

## 2016-03-25 MED ORDER — CEFAZOLIN IN D5W 1 GM/50ML IV SOLN: 1.0000 g | INTRAVENOUS | Status: DC

## 2016-03-25 MED ORDER — MORPHINE SULFATE (PF) 4 MG/ML IV SOLN
2.0000 mg | Freq: Once | INTRAVENOUS | Status: AC
  Administered 2016-03-25: 2 mg via INTRAVENOUS
  Filled 2016-03-25: qty 1

## 2016-03-25 MED ORDER — HEPARIN BOLUS VIA INFUSION
3300.0000 [IU] | Freq: Once | INTRAVENOUS | Status: AC
  Administered 2016-03-25: 3300 [IU] via INTRAVENOUS
  Filled 2016-03-25: qty 3300

## 2016-03-25 MED ORDER — MIDAZOLAM HCL 5 MG/5ML IJ SOLN
INTRAMUSCULAR | Status: AC
  Filled 2016-03-25: qty 5

## 2016-03-25 MED ORDER — POTASSIUM CHLORIDE CRYS ER 20 MEQ PO TBCR
40.0000 meq | EXTENDED_RELEASE_TABLET | Freq: Once | ORAL | Status: AC
  Administered 2016-03-25: 40 meq via ORAL

## 2016-03-25 MED ORDER — CLINDAMYCIN PHOSPHATE 300 MG/50ML IV SOLN
300.0000 mg | Freq: Once | INTRAVENOUS | Status: AC
  Administered 2016-03-25: 300 mg via INTRAVENOUS
  Filled 2016-03-25: qty 50

## 2016-03-25 SURGICAL SUPPLY — 3 items
KIT FEMORAL DEL DENALI (Miscellaneous) ×3 IMPLANT
PACK ANGIOGRAPHY (CUSTOM PROCEDURE TRAY) ×3 IMPLANT
WIRE J 3MM .035X145CM (WIRE) ×6 IMPLANT

## 2016-03-25 NOTE — Op Note (Signed)
[facility]   OPERATIVE NOTE    PRE-OPERATIVE DIAGNOSIS: DVT with PE  POST-OPERATIVE DIAGNOSIS: Same  PROCEDURE: 1.   Ultrasound guidance for vascular access to the right common femoral vein 2.   Catheter placement into the inferior vena cava 3.   Inferior venacavogram 4.   Placement of a [place] IVC filter  SURGEON: Hortencia Pilar  ASSISTANT(S): None  ANESTHESIA: Conscious sedation was administered under my direct supervision. IV Versed plus fentanyl were utilized. Continuous ECG, pulse oximetry and blood pressure was monitored throughout the entire procedure. Conscious sedation was for a total of 20 minutes.  ESTIMATED BLOOD LOSS: minimal  FINDING(S): 1.  Patent IVC  SPECIMEN(S):  none  INDICATIONS:   Heather Cherry is a 30 y.o. y.o. female who presents with new PE in spite of anticoagulation.  Inferior vena cava filter is indicated for this reason.  Risks and benefits including filter thrombosis, migration, fracture, bleeding, and infection were all discussed.  We discussed that all IVC filters that we place can be removed if desired from the patient once the need for the filter has passed.    DESCRIPTION: After obtaining full informed written consent, the patient was brought back to the vascular suite. The skin was sterilely prepped and draped in a sterile surgical field was created. The right common femoral was accessed under direct ultrasound guidance without difficulty with a Seldinger needle and a J-wire was then placed. The dilator is passed over the wire and the delivery sheath was placed into the inferior vena cava.  Inferior venacavogram was performed. This demonstrated a patent IVC with the level of the renal veins at L1.  The filter was then deployed into the inferior vena cava at the level of L2 just below the renal veins. The delivery sheath was then removed. Pressure was held. Sterile dressings were placed. The patient tolerated the procedure  well and was taken to the recovery room in stable condition.  Interpretation: Widely patent IVC; 22 mm in diameter  COMPLICATIONS: None  CONDITION: Stable  Hortencia Pilar  03/25/2016, 11:53 AM

## 2016-03-25 NOTE — Consult Note (Signed)
ANTICOAGULATION CONSULT NOTE - Initial Consult  Pharmacy Consult for heparin drip Indication: pulmonary embolus  Allergies  Allergen Reactions  . Lisinopril Cough    Patient Measurements: Height: 5\' 9"  (175.3 cm) Weight: (!) 362 lb 9.6 oz (164.5 kg) IBW/kg (Calculated) : 66.2 Heparin Dosing Weight: 109kg  Vital Signs: Temp: 97.8 F (36.6 C) (12/13 1104) Temp Source: Oral (12/13 1104) BP: 123/62 (12/13 1235) Pulse Rate: 78 (12/13 1235)  Labs:  Recent Labs  03/24/16 1720 03/24/16 2231 03/25/16 0249 03/25/16 1017  HGB 10.2*  --  9.1*  --   HCT 32.8*  --  28.7*  --   PLT 399  --  381  --   APTT 57*  --   --   --   LABPROT 27.9*  --  27.8*  --   INR 2.55  --  2.54  --   HEPARINUNFRC  --   --  <0.10* 0.24*  CREATININE 0.75  --  0.57  --   TROPONINI <0.03 <0.03 <0.03 <0.03    Estimated Creatinine Clearance: 172.8 mL/min (by C-G formula based on SCr of 0.57 mg/dL).   Medical History: Past Medical History:  Diagnosis Date  . Abnormal vaginal bleeding   . Anxiety   . Depression   . Diabetes mellitus without complication ([place])   . DVT (deep venous thrombosis) ([place])   . Fibroid   . Hypertension   . Obesity   . Pulmonary emboli ([place])   . Sinus congestion     Medications:  Scheduled:  . acetaZOLAMIDE  500 mg Oral BID  . amoxicillin-clavulanate  1 tablet Oral Q12H  . ferrous sulfate  325 mg Oral TID WC  . folic acid  1 mg Oral Daily  . gabapentin  300 mg Oral TID  . guaiFENesin  600 mg Oral BID  . insulin aspart  0-5 Units Subcutaneous QHS  . insulin aspart  0-9 Units Subcutaneous TID WC  . metFORMIN  1,000 mg Oral Q breakfast  . pantoprazole  40 mg Oral Daily  . polyethylene glycol  17 g Oral Daily  . sodium chloride flush  3 mL Intravenous Q12H    Assessment: Pt is a 30 year old female recently diagnosed with a DVT and PE, currently on warfarin (therapeutic level) who presents with chest pain. Pharmacy is consulted to start heparin drip with concerns  over possible worsening clot burden/new clots. Patient now s/p IVC to resume heparin drip.   Goal of Therapy:  Heparin level 0.3-0.7 units/ml Monitor platelets by anticoagulation protocol: Yes   Plan:  Heparin drip to be resumed s/p IVC placement at previous rate of 2200 units/hr. Will recheck a HL 6 hours after resuming infusion.   Ulice Dash, PharmD Clinical Pharmacist  03/25/2016 2:49 PM

## 2016-03-25 NOTE — Progress Notes (Signed)
[facility] NAME: Heather Cherry    MR#:  [id number]  DATE OF BIRTH:  January 10, 1986  SUBJECTIVE:  CHIEF COMPLAINT:   Chief Complaint  Patient presents with  . Chest Pain  The patient is 30 year old African-American female with past medical history significant for history of DVT, PE, who failed therapy to Xarelto and Coumadin, who presents to the hospital with complaints of sudden onset of chest pains worsened his inspiration, shortness of breath. The angiogram. In emergency room revealed pulmonary emboli, additional left-sided clot, right lung base infarct. Patient was admitted to the hospital for anticoagulation with heparin intravenously. Vascular surgery consultation was requested, IVC filter was placed 13th of December 2017. Patient feels good today, complains of some right-sided chest pain with inspiration, mild shortness of breath  Review of Systems  Constitutional: Negative for chills, fever and weight loss.  HENT: Negative for congestion.   Eyes: Negative for blurred vision and double vision.  Respiratory: Positive for shortness of breath. Negative for cough, sputum production and wheezing.   Cardiovascular: Positive for chest pain. Negative for palpitations, orthopnea, leg swelling and PND.  Gastrointestinal: Negative for abdominal pain, blood in stool, constipation, diarrhea, nausea and vomiting.  Genitourinary: Negative for dysuria, frequency, hematuria and urgency.  Musculoskeletal: Negative for falls.  Neurological: Negative for dizziness, tremors, focal weakness and headaches.  Endo/Heme/Allergies: Does not bruise/bleed easily.  Psychiatric/Behavioral: Negative for depression. The patient does not have insomnia.     VITAL SIGNS: Blood pressure 123/62, pulse 78, temperature 97.8 F (36.6 C), temperature source Oral, resp. rate 16, height 5\' 9"  (1.753 m), weight (!) 164.5 kg (362 lb 9.6 oz), last menstrual period  02/27/2016, SpO2 97 %.  PHYSICAL EXAMINATION:   GENERAL:  30 y.o.-year-old obese young patient lying in the bed with no acute distress.  EYES: Pupils equal, round, reactive to light and accommodation. No scleral icterus. Extraocular muscles intact.  HEENT: Head atraumatic, normocephalic. Oropharynx and nasopharynx clear.  NECK:  Supple, no jugular venous distention. No thyroid enlargement, no tenderness.  LUNGS: Normal breath sounds bilaterally, no wheezing, rales,rhonchi or crepitation. No use of accessory muscles of respiration.  CARDIOVASCULAR: S1, S2 normal. No murmurs, rubs, or gallops.  ABDOMEN: Soft, nontender, nondistended. Bowel sounds present. No organomegaly or mass.  EXTREMITIES: No pedal edema, cyanosis, or clubbing.  NEUROLOGIC: Cranial nerves II through XII are intact. Muscle strength 5/5 in all extremities. Sensation intact. Gait not checked.  PSYCHIATRIC: The patient is alert and oriented x 3.  SKIN: No obvious rash, lesion, or ulcer.   ORDERS/RESULTS REVIEWED:   CBC  Recent Labs Lab 03/19/16 0157 03/20/16 0307 03/24/16 1720 03/25/16 0249  WBC 7.3 8.0 9.6 7.1  HGB 8.3* 8.5* 10.2* 9.1*  HCT 28.3* 28.8* 32.8* 28.7*  PLT 272 282 399 381  MCV 71.3* 72.2* 69.0* 70.0*  MCH 20.9* 21.3* 21.5* 22.1*  MCHC 29.3* 29.5* 31.2* 31.6*  RDW 21.9* 22.1* 24.9* 24.3*   ------------------------------------------------------------------------------------------------------------------  Chemistries   Recent Labs Lab 03/19/16 0157 03/20/16 0307 03/24/16 1720 03/25/16 0249  NA 137 137 135 136  K 3.7 4.5 3.5 3.0*  CL 110 109 103 106  CO2 21* 17* 21* 23  GLUCOSE 116* 129* 95 149*  BUN 7 8 9 8   CREATININE 0.77 0.78 0.75 0.57  CALCIUM 8.4* 8.5* 9.3 8.9  AST  --   --   --  14*  ALT  --   --   --  11*  ALKPHOS  --   --   --  69  BILITOT  --   --   --  0.7    ------------------------------------------------------------------------------------------------------------------ estimated creatinine clearance is 172.8 mL/min (by C-G formula based on SCr of 0.57 mg/dL). ------------------------------------------------------------------------------------------------------------------  Recent Labs  03/25/16 0249  TSH 1.627    Cardiac Enzymes  Recent Labs Lab 03/24/16 2231 03/25/16 0249 03/25/16 1017  TROPONINI <0.03 <0.03 <0.03   ------------------------------------------------------------------------------------------------------------------ Invalid input(s): POCBNP ---------------------------------------------------------------------------------------------------------------  RADIOLOGY: Ct Angio Chest Pe W And/or Wo Contrast  Result Date: 03/24/2016 CLINICAL DATA:  Initial evaluation for acute chest pain. History of recent PE. EXAM: CT ANGIOGRAPHY CHEST WITH CONTRAST TECHNIQUE: Multidetector CT imaging of the chest was performed using the standard protocol during bolus administration of intravenous contrast. Multiplanar CT image reconstructions and MIPs were obtained to evaluate the vascular anatomy. CONTRAST:  75 cc of Isovue 370. COMPARISON:  Prior CT from 03/12/2016 FINDINGS: Cardiovascular: Intrathoracic aorta of normal caliber and appearance without acute abnormality. Visualized great vessels within normal limits. Heart size upper limits of normal. No pericardial effusion. Main pulmonary artery at upper limits of normal for size measuring 2.9 cm in diameter. There are persistent subtle heterogeneous filling defects within several segmental pulmonary artery supplying the right lower lobe, most evident on coronal sequence (series 7, image 60). These are nonocclusive. Additional residual clot within segmental artery supplying the right middle lobe as well (series 7, image 44). Few additional scattered possible filling defects noted within  segmental artery supplying the upper lobes as well (Series 9, image 69 62 on the right, series 9, image 68 on the left. No evidence for right heart strain on today's study (RV/LV ratio = 0.8 on today's). Mediastinum/Nodes: Visualized thyroid is normal. No pathologically enlarged mediastinal, hilar, or axillary lymph nodes. Esophagus within normal limits. Lungs/Pleura: Pleural-based parenchymal [place] at the posterior reader right lung base, which may reflect an evolving pulmonary infarct (series 6, image 68). Lungs are otherwise clear. No other airspace consolidation. No pulmonary edema or pleural effusion. Mild bibasilar air trapping. No pneumothorax. No other worrisome pulmonary nodule or mass. Upper Abdomen: Visualized upper abdomen within normal limits. Patient is status post cholecystectomy. Musculoskeletal: No acute osseous abnormality. No worrisome lytic or blastic osseous lesions. Review of the MIP images confirms the above findings. IMPRESSION: 1. Persistent nonocclusive pulmonary emboli involving the right middle and lower lobes as above, with additional possible tiny nonocclusive emboli within the upper lobes as well. Overall, these are largely subacute in appearance as compared to previous exam, although the left sided clot would be new from previous. No evidence for right heart strain on today's study. 2. Wedge-shaped opacity within the peripheral right lung base, suspected to reflect an evolving pulmonary infarct. Critical Value/emergent results were called by telephone at the time of interpretation on 03/24/2016 at 7:23 pm to Dr. Rudene Re , who verbally acknowledged these results. Electronically Signed   By: Jeannine Boga M.D.   On: 03/24/2016 19:28    EKG:  Orders placed or performed during the hospital encounter of 03/24/16  . EKG 12-Lead  . EKG 12-Lead    ASSESSMENT AND PLAN:  Active Problems:   Pulmonary embolism ([place])  #1. Recurrent pulmonary embolism, no on  heparin intravenously, may end up on Lovenox subcutaneously, as has recurrent pulmonary embolism on therapeutic Coumadin, failed Xarelto therapy, status post IVC filter placement, 03/25/2016, awaiting for oncology consultation, hypercoagulable workup is pending #2. Bilateral lower extremity DVT, continue heparin intravenously, oncology consultation is pending #3. Hypokalemia, supplement orally #4 anemia, follow closely, no active bleeding  was noted #5. Morbid obesity, TSH is normal, get lipid panel, initiate patient on atorvastatin if LDL is high, may decrease the risk of recurrent thromboembolic fenomenon  Management plans discussed with the patient, family and they are in agreement.   DRUG ALLERGIES:  Allergies  Allergen Reactions  . Lisinopril Cough    CODE STATUS:     Code Status Orders        Start     Ordered   03/24/16 2207  Full code  Continuous     03/24/16 2206    Code Status History    Date Active Date Inactive Code Status Order ID Comments User Context   03/18/2016 12:40 AM 03/20/2016  7:12 PM Full Code [id number]  Rise Patience, MD Inpatient   03/12/2016 10:44 PM 03/17/2016  4:33 PM Full Code [id number]  Lance Coon, MD Inpatient      TOTAL TIME TAKING CARE OF THIS PATIENT: 35  minutes.  Discussed this patient's mother, all questions were answered  Kathlee Barnhardt M.D on 03/25/2016 at 5:37 PM  Between 7am to 6pm - Pager - [phone]  After 6pm go to [email or web address] - password [email or web address] [facility]  [facility]  Office  [phone]  CC: Primary care physician; Suzanna Obey, MD

## 2016-03-25 NOTE — Progress Notes (Signed)
Patient was transferred from the ER following c/o chest pain and SOB. On admission to the unit patient has heparin infusing, A&O X4, ambulatory and accompanied by her mother. Patient was NSR on the monitor, with stable VS. Patient and mother was oriented to her room and needed items placed within patient's reach. NPO overnight per order.

## 2016-03-25 NOTE — Care Management (Signed)
Patient had IVC filter placed.  it is documented that patient was evaluated by oncology at [facility] but do not see information in [facility].  Her INR 12.11 was 3.7.  Informed by attending that patient may discharge home on Lovenox injections.  She has failed outpatient Xarelto and outpatient coumadin.  Patient's pharmacy coverage is through [employer]. If written for generic, will not require a prior approval and copay for 60 doses a month would be 47 dollars.

## 2016-03-25 NOTE — Consult Note (Addendum)
[facility] Vascular Consult Note  MRN : [id number]  Heather Cherry is a 30 y.o. (16-Oct-1985) female who presents with chief complaint of  Chief Complaint  Patient presents with  . Chest Pain   History of Present Illness:  Heather Cherry is a 30 year old female with a history of recently diagnosed DVT and PE currently on warfarin who presented to [facility] for evaluation of "chest pain". Patient endorses a history of being discharged from [facility] 4 days ago where she was admitted for new onset of PE. Patient reports being first diagnosed with DVT in the beginning of November and was started on Xarelto. She was then found to have PE and was started on heparin gtt and coumadin. Patient reports that she has been having intermittent right-sided pleuritic chest pain yesterday while she was driving to work when she developed sudden onset of severe midline and left-sided chest pain, pleuritic, 9 out of 10, associated with shortness of breath. She reports that she has never had pain like this before. She denied experiencing dizziness, nausea, vomiting, diaphoresis. Patient endorses compliance with her Coumadin.  Patient was admitted and started on heparin drip. CT angiogram: persistent nonocclusive pulmonary emboli involving the right middle and lower lobes with additional possible tiny nonocclusive emboli within the upper lobes as well. Overall, these are largely subacute in appearance as compared to previous exam. Left sided clot would be new from previous. No evidence for right heart strain.   Patients INR: 2.5 (03/25/16).  Consulted by primary team Dr. Margaretmary Eddy for placement of IVC filter.  Current Facility-Administered Medications  Medication Dose Route Frequency Provider Last Rate Last Dose  . acetaminophen (TYLENOL) tablet 650 mg  650 mg Oral Q6H PRN Nicholes Mango, MD       Or  . acetaminophen (TYLENOL) suppository 650 mg  650 mg Rectal Q6H PRN Nicholes Mango, MD      .  acetaZOLAMIDE (DIAMOX) tablet 500 mg  500 mg Oral BID Nicholes Mango, MD   500 mg at 03/24/16 2253  . amoxicillin-clavulanate (AUGMENTIN) 875-125 MG per tablet 1 tablet  1 tablet Oral Q12H Nicholes Mango, MD   1 tablet at 03/24/16 2253  . clindamycin (CLEOCIN) IVPB 300 mg  300 mg Intravenous Once [employer], PA-C      . clonazePAM (KLONOPIN) tablet 0.5 mg  0.5 mg Oral BID PRN Nicholes Mango, MD      . dextrose 5 % and 0.45 % NaCl with KCl 20 mEq/L infusion   Intravenous Continuous Kenli Waldo A El[employer]ra [employer], PA-C      . ferrous sulfate tablet 325 mg  325 mg Oral TID WC Nicholes Mango, MD      . folic acid (FOLVITE) tablet 1 mg  1 mg Oral Daily Aruna Gouru, MD      . gabapentin (NEURONTIN) capsule 300 mg  300 mg Oral TID Nicholes Mango, MD   300 mg at 03/24/16 2253  . guaiFENesin (MUCINEX) 12 hr tablet 600 mg  600 mg Oral BID Nicholes Mango, MD   600 mg at 03/24/16 2253  . heparin ADULT infusion 100 units/mL (25000 units/21mL sodium chloride 0.45%)  2,200 Units/hr Intravenous Continuous Nicholes Mango, MD 22 mL/hr at 03/25/16 0709 2,200 Units/hr at 03/25/16 0709  . HYDROcodone-acetaminophen (NORCO/VICODIN) 5-325 MG per tablet 1-2 tablet  1-2 tablet Oral Q4H PRN Aruna Gouru, MD      . insulin aspart (novoLOG) injection 0-5 Units  0-5 Units Subcutaneous QHS Nicholes Mango, MD      .  insulin aspart (novoLOG) injection 0-9 Units  0-9 Units Subcutaneous TID WC Aruna Gouru, MD      . metFORMIN (GLUCOPHAGE-XR) 24 hr tablet 1,000 mg  1,000 mg Oral Q breakfast Aruna Gouru, MD      . ondansetron (ZOFRAN) tablet 4 mg  4 mg Oral Q6H PRN Nicholes Mango, MD       Or  . ondansetron (ZOFRAN) injection 4 mg  4 mg Intravenous Q6H PRN Nicholes Mango, MD      . pantoprazole (PROTONIX) EC tablet 40 mg  40 mg Oral Daily Aruna Gouru, MD      . polyethylene glycol (MIRALAX / GLYCOLAX) packet 17 g  17 g Oral Daily Aruna Gouru, MD      . potassium chloride SA (K-DUR,KLOR-CON) CR tablet 40 mEq  40 mEq Oral Once Theodoro Grist, MD      . sodium  chloride flush (NS) 0.9 % injection 3 mL  3 mL Intravenous Q12H Nicholes Mango, MD       Past Medical History:  Diagnosis Date  . Abnormal vaginal bleeding   . Anxiety   . Depression   . Diabetes mellitus without complication ([place])   . DVT (deep venous thrombosis) ([place])   . Fibroid   . Hypertension   . Obesity   . Pulmonary emboli ([place])   . Sinus congestion    Past Surgical History:  Procedure Laterality Date  . CHOLECYSTECTOMY    . WISDOM TOOTH EXTRACTION     Social History Social History  Substance Use Topics  . Smoking status: Never Smoker  . Smokeless tobacco: Never Used  . Alcohol use Yes     Comment: occas. social   Family History Family History  Problem Relation Age of Onset  . Cancer Paternal Grandfather   . Rheumatologic disease Neg Hx   Denies family history of bleeding / clotting disorder, PAD or renal disease.  Allergies  Allergen Reactions  . Lisinopril Cough   REVIEW OF SYSTEMS (Negative unless checked)  Constitutional: [] Weight loss  [] Fever  [] Chills Cardiac: [x] Chest pain   [x] Chest pressure   [] Palpitations   [] Shortness of breath when laying flat   [] Shortness of breath at rest   [] Shortness of breath with exertion. Vascular:  [] Pain in legs with walking   [] Pain in legs at rest   [] Pain in legs when laying flat   [] Claudication   [] Pain in feet when walking  [] Pain in feet at rest  [] Pain in feet when laying flat   [x] History of DVT   [] Phlebitis   [] Swelling in legs   [] Varicose veins   [] Non-healing ulcers Pulmonary:   [] Uses home oxygen   [] Productive cough   [] Hemoptysis   [] Wheeze  [] COPD   [] Asthma Neurologic:  [] Dizziness  [] Blackouts   [] Seizures   [] History of stroke   [] History of TIA  [] Aphasia   [] Temporary blindness   [] Dysphagia   [] Weakness or numbness in arms   [] Weakness or numbness in legs Musculoskeletal:  [] Arthritis   [] Joint swelling   [] Joint pain   [] Low back pain Hematologic:  [] Easy bruising  [] Easy bleeding    [x] Hypercoagulable state   [] Anemic  [] Hepatitis Gastrointestinal:  [] Blood in stool   [] Vomiting blood  [] Gastroesophageal reflux/heartburn   [] Difficulty swallowing. Genitourinary:  [] Chronic kidney disease   [] Difficult urination  [] Frequent urination  [] Burning with urination   [] Blood in urine Skin:  [] Rashes   [] Ulcers   [] Wounds Psychological:  [] History of anxiety   []  History of  major depression.  Physical Examination  Vitals:   03/24/16 2203 03/24/16 2300 03/25/16 0447 03/25/16 0756  BP: (!) 154/81  (!) 105/50 (!) 106/53  Pulse: 86  83 77  Resp: 16  16   Temp: 98.6 F (37 C)  98.5 F (36.9 C)   TempSrc: Oral  Oral   SpO2: 98%  98% 99%  Weight: (!) 362 lb 9.6 oz (164.5 kg)     Height:  5\' 9"  (1.753 m)     Body mass index is 53.55 kg/m. Gen:  Obese, WD/WN, NAD Head: [place]/AT, No temporalis wasting. Prominent temp pulse not noted. Ear/Nose/Throat: Hearing grossly intact, nares w/o erythema or drainage, oropharynx w/o Erythema/Exudate Eyes: Sclera non-icteric, conjunctiva clear Neck: Trachea midline.  No JVD.  Pulmonary:  Good air movement, respirations not labored, equal bilaterally.  Cardiac: RRR, normal S1, S2. Vascular:  Vessel Right Left  Radial Palpable Palpable  Ulnar Palpable Palpable  Brachial Palpable Palpable  Carotid Palpable, without bruit Palpable, without bruit  Aorta Not palpable N/A  Femoral Palpable Palpable  Popliteal Palpable Palpable  PT Palpable Palpable  DP Palpable Palpable   Gastrointestinal: soft, non-tender/non-distended. No guarding/reflex.  Musculoskeletal: M/S 5/5 throughout.  Extremities without ischemic changes.  No deformity or atrophy. Mild edema bilaterally. Neurologic: Sensation grossly intact in extremities.  Symmetrical.  Speech is fluent. Motor exam as listed above. Psychiatric: Judgment intact, Mood & affect appropriate for pt's clinical situation. Dermatologic: No rashes or ulcers noted.  No cellulitis or open wounds. Lymph :  No Cervical, Axillary, or Inguinal lymphadenopathy.  CBC Lab Results  Component Value Date   WBC 7.1 03/25/2016   HGB 9.1 (L) 03/25/2016   HCT 28.7 (L) 03/25/2016   MCV 70.0 (L) 03/25/2016   PLT 381 03/25/2016   BMET    Component Value Date/Time   NA 136 03/25/2016 0249   K 3.0 (L) 03/25/2016 0249   CL 106 03/25/2016 0249   CO2 23 03/25/2016 0249   GLUCOSE 149 (H) 03/25/2016 0249   BUN 8 03/25/2016 0249   CREATININE 0.57 03/25/2016 0249   CALCIUM 8.9 03/25/2016 0249   GFRNONAA >60 03/25/2016 0249   GFRAA >60 03/25/2016 0249   Estimated Creatinine Clearance: 172.8 mL/min (by C-G formula based on SCr of 0.57 mg/dL).  COAG Lab Results  Component Value Date   INR 2.54 03/25/2016   INR 2.55 03/24/2016   INR 2.68 03/20/2016   Radiology Dg Chest 2 View  Result Date: 03/17/2016 CLINICAL DATA:  Chest pain and dyspnea for 5 days. EXAM: CHEST  2 VIEW COMPARISON:  03/12/2016 FINDINGS: Slight patchy opacity in the central right lung may represent infectious infiltrate, atelectasis, pulmonary infarction. This is accentuated by a shallow degree of inspiration. The left lung is clear. There is no pleural effusion. Hilar and mediastinal contours are unremarkable and unchanged. IMPRESSION: Patchy airspace opacity in the right lung. Considerations include infectious infiltrate, atelectasis, pulmonary infarction. Electronically Signed   By: Andreas Newport M.D.   On: 03/17/2016 21:11   Dg Chest 2 View  Result Date: 03/12/2016 CLINICAL DATA:  Chest pain under the right breast EXAM: CHEST  2 VIEW COMPARISON:  None. FINDINGS: The heart size and mediastinal contours are within normal limits. Both lungs are clear. The visualized skeletal structures are unremarkable. Surgical clips in the right upper quadrant. IMPRESSION: No active cardiopulmonary disease. Electronically Signed   By: Donavan Foil M.D.   On: 03/12/2016 18:34   Ct Chest Wo Contrast  Result Date: 03/18/2016 CLINICAL DATA:  Severe  dyspnea since yesterday. History of DVT. Diagnostic pulmonary embolus last week. EXAM: CT CHEST WITHOUT CONTRAST TECHNIQUE: Multidetector CT imaging of the chest was performed following the standard protocol without IV contrast. COMPARISON:  03/12/2016 CT FINDINGS: Cardiovascular: The heart is top-normal in size. No coronary arteriosclerosis. Normal takeoff of the great vessels. No pericardial effusion. Mediastinum/Nodes: No supraclavicular, axillary nor mediastinal adenopathy. Nor aortic aneurysm. Visualized trachea and mainstem bronchi are patent. The esophagus is unremarkable. Lungs/Pleura: New right upper lobe and right lower lobe pneumonic consolidations with right middle and bilateral lower lobe atelectasis. Small right parapneumonic effusion. No pneumothorax. Upper Abdomen: Negative for acute abnormalities Musculoskeletal: Negative IMPRESSION: New right upper and lower lobe pneumonic consolidations with atelectasis in the right middle lobe and both lower lobes. Small right parapneumonic effusion. Electronically Signed   By: Ashley Royalty M.D.   On: 03/18/2016 17:12   Ct Angio Chest Pe W And/or Wo Contrast  Result Date: 03/24/2016 CLINICAL DATA:  Initial evaluation for acute chest pain. History of recent PE. EXAM: CT ANGIOGRAPHY CHEST WITH CONTRAST TECHNIQUE: Multidetector CT imaging of the chest was performed using the standard protocol during bolus administration of intravenous contrast. Multiplanar CT image reconstructions and MIPs were obtained to evaluate the vascular anatomy. CONTRAST:  75 cc of Isovue 370. COMPARISON:  Prior CT from 03/12/2016 FINDINGS: Cardiovascular: Intrathoracic aorta of normal caliber and appearance without acute abnormality. Visualized great vessels within normal limits. Heart size upper limits of normal. No pericardial effusion. Main pulmonary artery at upper limits of normal for size measuring 2.9 cm in diameter. There are persistent subtle heterogeneous filling defects  within several segmental pulmonary artery supplying the right lower lobe, most evident on coronal sequence (series 7, image 60). These are nonocclusive. Additional residual clot within segmental artery supplying the right middle lobe as well (series 7, image 44). Few additional scattered possible filling defects noted within segmental artery supplying the upper lobes as well (Series 9, image 69 62 on the right, series 9, image 68 on the left. No evidence for right heart strain on today's study (RV/LV ratio = 0.8 on today's). Mediastinum/Nodes: Visualized thyroid is normal. No pathologically enlarged mediastinal, hilar, or axillary lymph nodes. Esophagus within normal limits. Lungs/Pleura: Pleural-based parenchymal [place] at the posterior reader right lung base, which may reflect an evolving pulmonary infarct (series 6, image 68). Lungs are otherwise clear. No other airspace consolidation. No pulmonary edema or pleural effusion. Mild bibasilar air trapping. No pneumothorax. No other worrisome pulmonary nodule or mass. Upper Abdomen: Visualized upper abdomen within normal limits. Patient is status post cholecystectomy. Musculoskeletal: No acute osseous abnormality. No worrisome lytic or blastic osseous lesions. Review of the MIP images confirms the above findings. IMPRESSION: 1. Persistent nonocclusive pulmonary emboli involving the right middle and lower lobes as above, with additional possible tiny nonocclusive emboli within the upper lobes as well. Overall, these are largely subacute in appearance as compared to previous exam, although the left sided clot would be new from previous. No evidence for right heart strain on today's study. 2. Wedge-shaped opacity within the peripheral right lung base, suspected to reflect an evolving pulmonary infarct. Critical Value/emergent results were called by telephone at the time of interpretation on 03/24/2016 at 7:23 pm to Dr. Rudene Re , who verbally acknowledged  these results. Electronically Signed   By: Jeannine Boga M.D.   On: 03/24/2016 19:28   Ct Angio Chest Pe W Or Wo Contrast  Result Date: 03/12/2016 CLINICAL DATA:  Chest pain  with inspiration. On blood thinner for RIGHT lower extremity deep vein thrombosis diagnosed February 24, 2016. EXAM: CT ANGIOGRAPHY CHEST WITH CONTRAST TECHNIQUE: Multidetector CT imaging of the chest was performed using the standard protocol during bolus administration of intravenous contrast. Multiplanar CT image reconstructions and MIPs were obtained to evaluate the vascular anatomy. CONTRAST:  75 cc Isovue 370 COMPARISON:  Chest radiograph March 12, 2016 at 1829 hours FINDINGS: Large body habitus results in overall noisy image quality. CARDIOVASCULAR: Suboptimal contrast opacification of the pulmonary artery's (164 Hounsfield units, target is 250 Hounsfield units. Isodense pulmonary artery, aorta and pulmonary veins. Main pulmonary artery is not enlarged. Linear filling defect RIGHT lower lobar pulmonary artery, acute RIGHT lower lobe segmental nonocclusive pulmonary emboli. Probable RIGHT upper lobe nonocclusive subsegmental PE. Heart size is normal, mild RIGHT heart strain (RV/ LV = 1). No pericardial effusions. Thoracic aorta is normal course and caliber, unremarkable. MEDIASTINUM/NODES: No lymphadenopathy by CT size criteria. LUNGS/PLEURA: Tracheobronchial tree is patent, no pneumothorax. No pleural effusions, focal consolidations, pulmonary nodules or masses. Minimal lingular atelectasis. UPPER ABDOMEN: Included view of the abdomen status post cholecystectomy. Mild suspected hepatomegaly. MUSCULOSKELETAL: Visualized soft tissues and included osseous structures appear normal. Scattered Schmorl's nodes. Review of the MIP images confirms the above findings. IMPRESSION: Technically limited examination due to bolus timing and habitus. Acute nonocclusive RIGHT segmental to subsegmental pulmonary emboli. CT evidence of right  heart strain (RV/LV Ratio = 1) associated with an increased risk of morbidity and mortality. Please activate Code PE by paging [phone]. Critical Value/emergent results were called by telephone at the time of interpretation on 03/12/2016 at 7:15 pm to Dr. Hinda Kehr , who verbally acknowledged these results. Electronically Signed   By: Elon Alas M.D.   On: 03/12/2016 19:16   US Venous Img Lower Unilateral Right  Result Date: 02/24/2016 CLINICAL DATA:  Acute right calf pain for the past 12 hours. Evaluate for DVT. EXAM: RIGHT LOWER EXTREMITY VENOUS DOPPLER ULTRASOUND TECHNIQUE: Gray-scale sonography with graded compression, as well as color Doppler and duplex ultrasound were performed to evaluate the lower extremity deep venous systems from the level of the common femoral vein and including the common femoral, femoral, profunda femoral, popliteal and calf veins including the posterior tibial, peroneal and gastrocnemius veins when visible. The superficial great saphenous vein was also interrogated. Spectral Doppler was utilized to evaluate flow at rest and with distal augmentation maneuvers in the common femoral, femoral and popliteal veins. COMPARISON:  None. FINDINGS: Examination is degraded due to patient body habitus and poor sonographic window. Contralateral Common Femoral Vein: Respiratory phasicity is normal and symmetric with the symptomatic side. No evidence of thrombus. Normal compressibility. Common Femoral Vein: No evidence of thrombus. Normal compressibility, respiratory phasicity and response to augmentation. Saphenofemoral Junction: No evidence of thrombus. Normal compressibility and flow on color Doppler imaging. Profunda Femoral Vein: No evidence of thrombus. Normal compressibility and flow on color Doppler imaging. Femoral Vein: No evidence of thrombus. Normal compressibility, respiratory phasicity and response to augmentation. Popliteal Vein: There is a mixed echogenic occlusive  thrombus within the distal aspect of the right popliteal vein, distal to the confluence of the lesser saphenous vein (image 31). Calf Veins: There is echogenic occlusive thrombus within the right posterior tibial vein (representative images 34 and 35). The peroneal vein is suboptimally imaged. Superficial Great Saphenous Vein: No evidence of thrombus. Normal compressibility and flow on color Doppler imaging. Venous Reflux:  None. Other Findings:  None. IMPRESSION: The examination is positive for age-indeterminate (potentially  chronic) occlusive DVT within distal aspect of the popliteal vein extending to involve (at least) the right posterior tibial vein. Electronically Signed   By: Sandi Mariscal M.D.   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 Recurrent DVT / PE: Patient has failed oral anticoagulation with both Xarelto and now Coumadin. Recommend IVC placement today with DR. Dew to prevent any additional PE. Procedure, risks and benefits explained to patient. All questions answered. Patient wishes to proceed.  2. Hematology Consult: Highly recommend heme consult due to recurrently DVT / PE while therapeutic on coumadin 3. Hypertension: Encouraged good control as its slows the progression of atherosclerotic disease.  Discussed with Dr. Mayme Genta, PA-C  03/25/2016 9:15 AM

## 2016-03-25 NOTE — Consult Note (Signed)
ANTICOAGULATION CONSULT NOTE - Initial Consult  Pharmacy Consult for heparin drip Indication: pulmonary embolus  Allergies  Allergen Reactions  . Lisinopril Cough    Patient Measurements: Height: 5\' 9"  (175.3 cm) Weight: (!) 362 lb 9.6 oz (164.5 kg) IBW/kg (Calculated) : 66.2 Heparin Dosing Weight: 109kg  Vital Signs: Temp: 98.6 F (37 C) (12/12 2203) Temp Source: Oral (12/12 2203) BP: 154/81 (12/12 2203) Pulse Rate: 86 (12/12 2203)  Labs:  Recent Labs  03/24/16 1720 03/24/16 2231 03/25/16 0249  HGB 10.2*  --  9.1*  HCT 32.8*  --  28.7*  PLT 399  --  381  APTT 57*  --   --   LABPROT 27.9*  --  27.8*  INR 2.55  --  2.54  HEPARINUNFRC  --   --  <0.10*  CREATININE 0.75  --   --   TROPONINI <0.03 <0.03 <0.03    Estimated Creatinine Clearance: 172.8 mL/min (by C-G formula based on SCr of 0.75 mg/dL).   Medical History: Past Medical History:  Diagnosis Date  . Abnormal vaginal bleeding   . Anxiety   . Depression   . Diabetes mellitus without complication ([place])   . DVT (deep venous thrombosis) ([place])   . Fibroid   . Hypertension   . Obesity   . Pulmonary emboli ([place])   . Sinus congestion     Medications:  Scheduled:  . acetaZOLAMIDE  500 mg Oral BID  . amoxicillin-clavulanate  1 tablet Oral Q12H  . ferrous sulfate  325 mg Oral TID WC  . folic acid  1 mg Oral Daily  . gabapentin  300 mg Oral TID  . guaiFENesin  600 mg Oral BID  . heparin  3,300 Units Intravenous Once  . insulin aspart  0-5 Units Subcutaneous QHS  . insulin aspart  0-9 Units Subcutaneous TID WC  . metFORMIN  1,000 mg Oral Q breakfast  . pantoprazole  40 mg Oral Daily  . polyethylene glycol  17 g Oral Daily  . sodium chloride flush  3 mL Intravenous Q12H    Assessment: Pt is a 30 year old female recently diagnosed with a DVT and PE, currently on warfarin (therapeutic level) who presents with chest pain. Pharmacy is consulted to start heparin drip with concerns over possible worsening  clot burden/new clots. Will not bolus since pt INR is therapeutic (2.55)  12/13 02:50 HL is subtherapeutic at <0.10  Goal of Therapy:  Heparin level 0.3-0.7 units/ml Monitor platelets by anticoagulation protocol: Yes   Plan:  Will bolus with Heparin 3300 units and increase drip rate to 2200 units/hr. Will check next HL in 6 hrs.  Paulina Fusi, PharmD, BCPS 03/25/2016 4:35 AM

## 2016-03-26 ENCOUNTER — Inpatient Hospital Stay: Payer: BC Managed Care – PPO

## 2016-03-26 DIAGNOSIS — D649 Anemia, unspecified: Secondary | ICD-10-CM

## 2016-03-26 DIAGNOSIS — I2699 Other pulmonary embolism without acute cor pulmonale: Principal | ICD-10-CM

## 2016-03-26 DIAGNOSIS — E876 Hypokalemia: Secondary | ICD-10-CM

## 2016-03-26 DIAGNOSIS — O2230 Deep phlebothrombosis in pregnancy, unspecified trimester: Secondary | ICD-10-CM

## 2016-03-26 LAB — TROPONIN I: Troponin I: <0.03 ng/mL (ref <0.03)

## 2016-03-26 LAB — LIPID PANEL
CHOL/HDL RATIO: 4 ratio
CHOLESTEROL: 141 mg/dL (ref 0–200)
HDL: 35 mg/dL — ABNORMAL LOW (ref >40)
LDL CALC: 73 mg/dL (ref 0–99)
TRIGLYCERIDES: 164 mg/dL — AB (ref <150)
VLDL: 33 mg/dL (ref 0–40)

## 2016-03-26 LAB — HEPARIN LEVEL (UNFRACTIONATED): Heparin Unfractionated: 0.35 IU/mL (ref 0.30–0.70)

## 2016-03-26 LAB — MAGNESIUM: Magnesium: 1.8 mg/dL (ref 1.7–2.4)

## 2016-03-26 LAB — CBC
HEMATOCRIT: 29.4 % — AB (ref 35.0–47.0)
HEMOGLOBIN: 9.3 g/dL — AB (ref 12.0–16.0)
MCH: 21.7 pg — ABNORMAL LOW (ref 26.0–34.0)
MCHC: 31.5 g/dL — AB (ref 32.0–36.0)
MCV: 68.8 fL — AB (ref 80.0–100.0)
Platelets: 344 10*3/uL (ref 150–440)
RBC: 4.27 MIL/uL (ref 3.80–5.20)
RDW: 24.2 % — AB (ref 11.5–14.5)
WBC: 8.2 10*3/uL (ref 3.6–11.0)

## 2016-03-26 LAB — BASIC METABOLIC PANEL
ANION GAP: 7 (ref 5–15)
BUN: 11 mg/dL (ref 6–20)
CHLORIDE: 108 mmol/L (ref 101–111)
CO2: 22 mmol/L (ref 22–32)
CREATININE: 0.62 mg/dL (ref 0.44–1.00)
Calcium: 9 mg/dL (ref 8.9–10.3)
GFR calc non Af Amer: >60 mL/min (ref >60)
GLUCOSE: 138 mg/dL — AB (ref 65–99)
Potassium: 3.5 mmol/L (ref 3.5–5.1)
Sodium: 137 mmol/L (ref 135–145)

## 2016-03-26 LAB — GLUCOSE, CAPILLARY
Glucose-Capillary: 124 mg/dL — ABNORMAL HIGH (ref 65–99)
Glucose-Capillary: 141 mg/dL — ABNORMAL HIGH (ref 65–99)

## 2016-03-26 LAB — HEMOGLOBIN A1C
HEMOGLOBIN A1C: 5.8 % — AB (ref 4.8–5.6)
Mean Plasma Glucose: 120 mg/dL

## 2016-03-26 IMAGING — CT CT ANGIO CHEST
2 of 6 series · 17 of 46 positions shown · IV contrast (APPLIED)
Comparison: CT angiogram of the chest [DATE] in CT
angiogram of the chest [DATE].

CLINICAL DATA: Worsening chest pain and shortness of breath.
History of pulmonary embolism. Status post inferior vena cava filter
placement [DATE].

EXAM:
CT ANGIOGRAPHY CHEST WITH CONTRAST
TECHNIQUE: Multidetector CT imaging of the chest was performed using the
standard protocol during bolus administration of intravenous
contrast. Multiplanar CT image reconstructions and MIPs were
obtained to evaluate the vascular anatomy.
CONTRAST:  100 cc Isovue 370

[Series 5: thins · axial · 0.91mm/px · z∈[-550,-305]mm · 15 of 269 slices shown]
[im 12/269  lung]
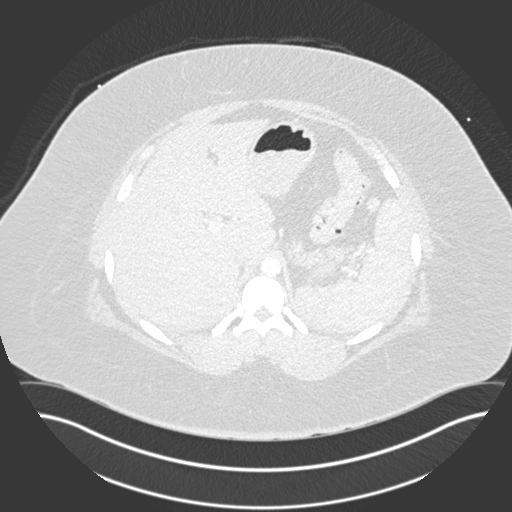
[im 35/269  soft-tissue]
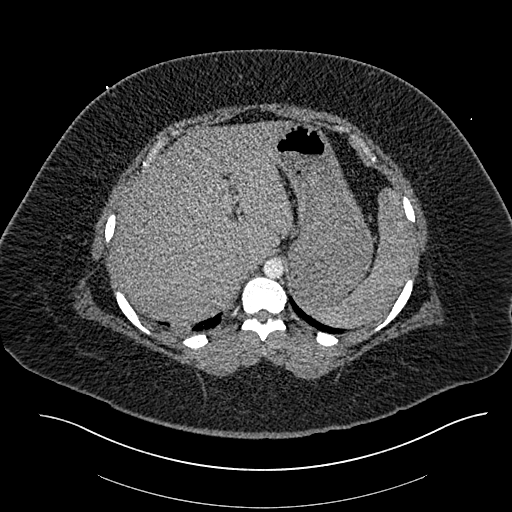
[im 47/269  lung]
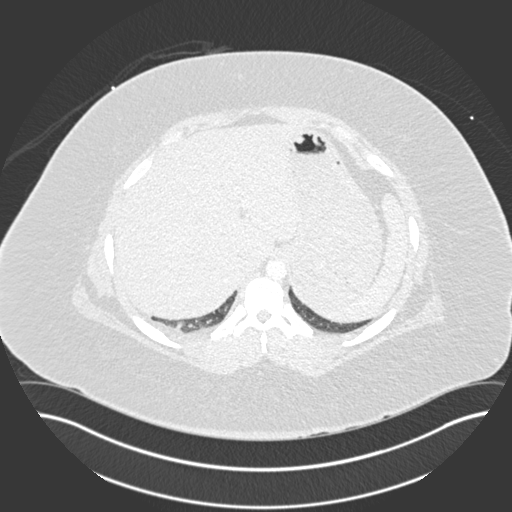
[im 70/269  soft-tissue]
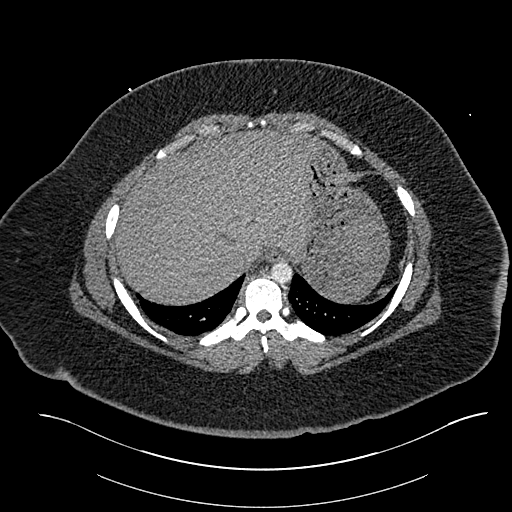
[im 82/269  lung]
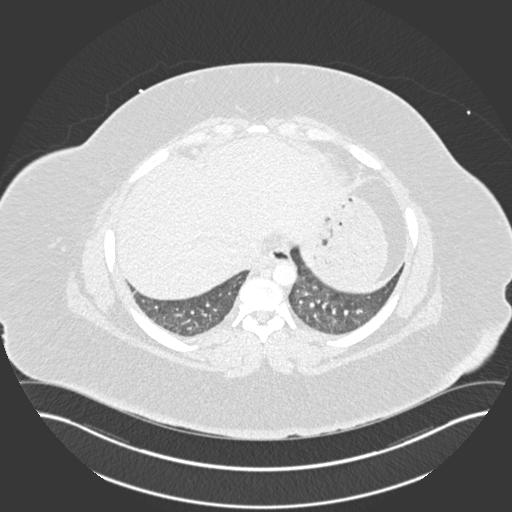
[im 105/269  soft-tissue]
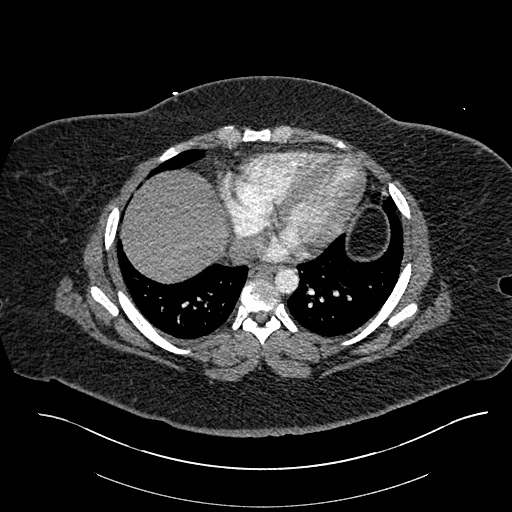
[im 117/269  lung]
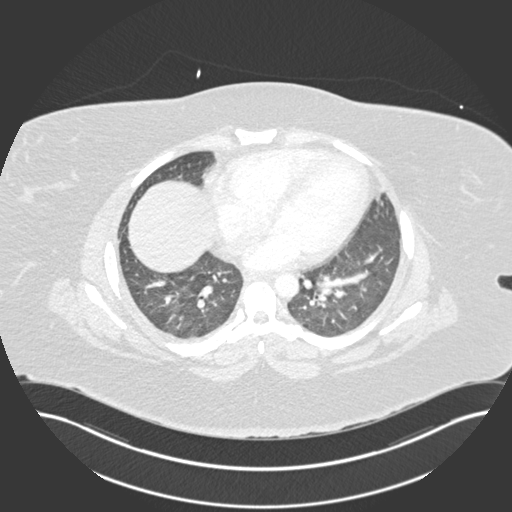
[im 140/269  soft-tissue]
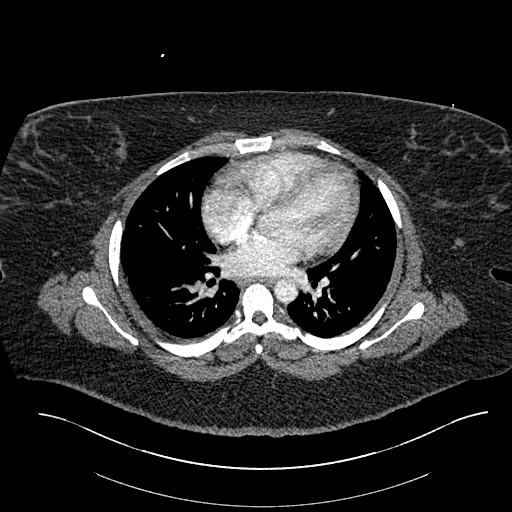
[im 152/269  lung]
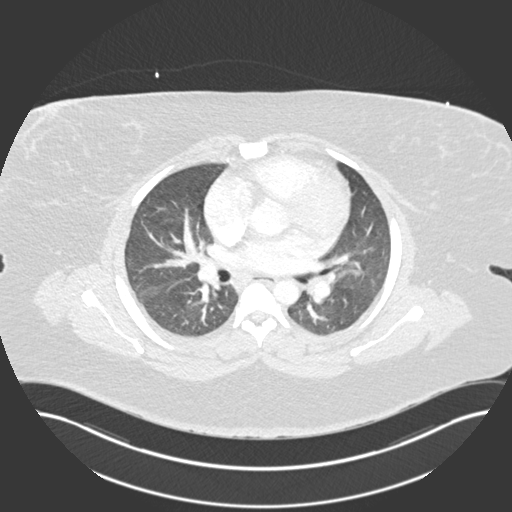
[im 164/269  soft-tissue]
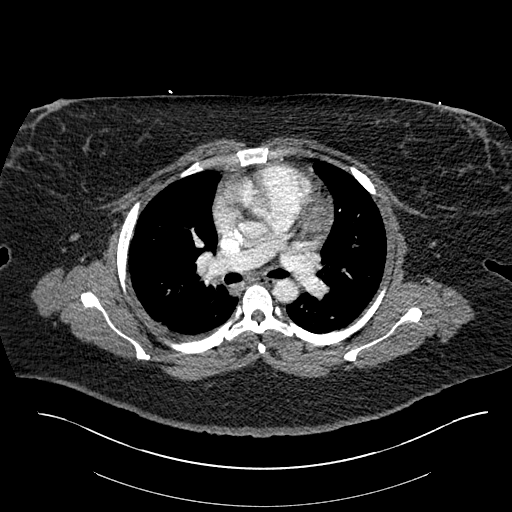
[im 187/269  lung]
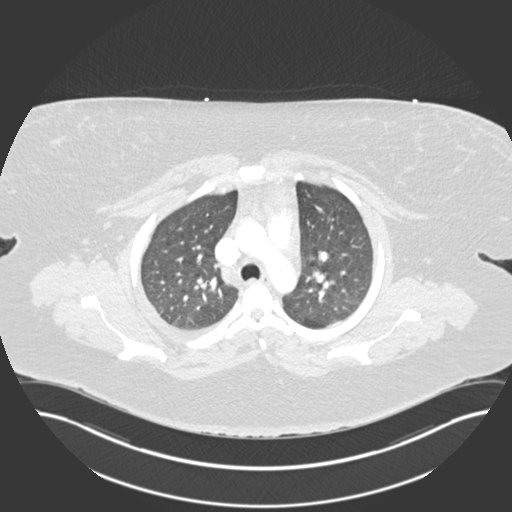
[im 199/269  soft-tissue]
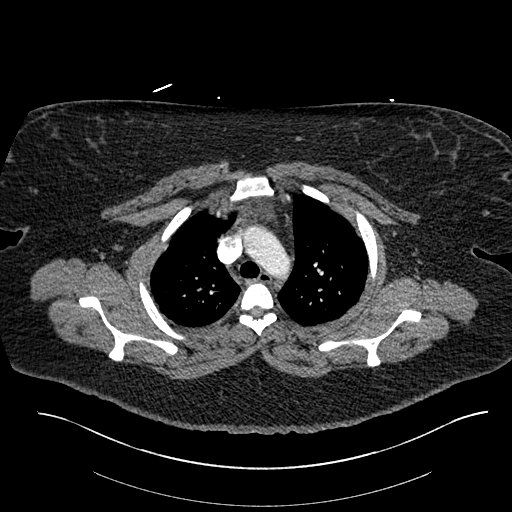
[im 222/269  lung]
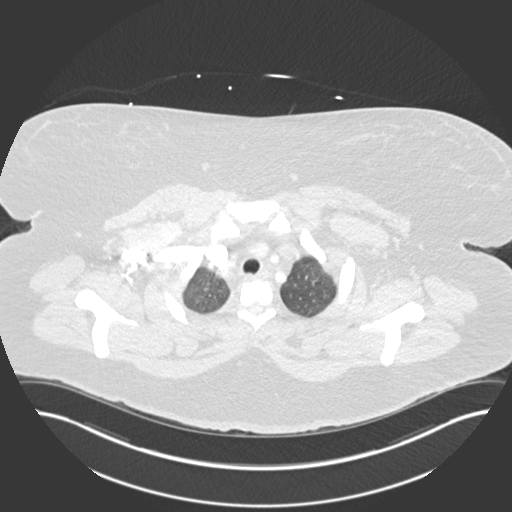
[im 234/269  soft-tissue]
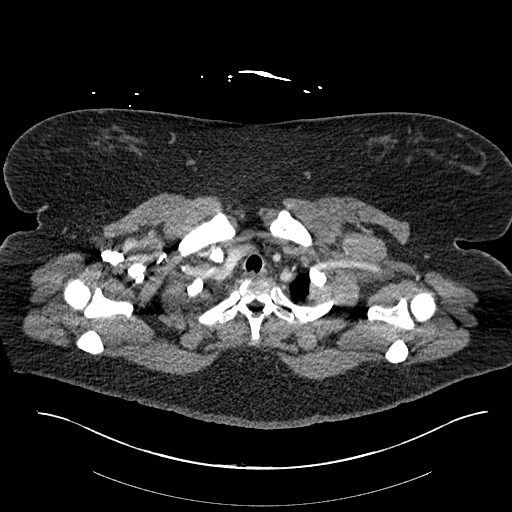
[im 257/269  lung]
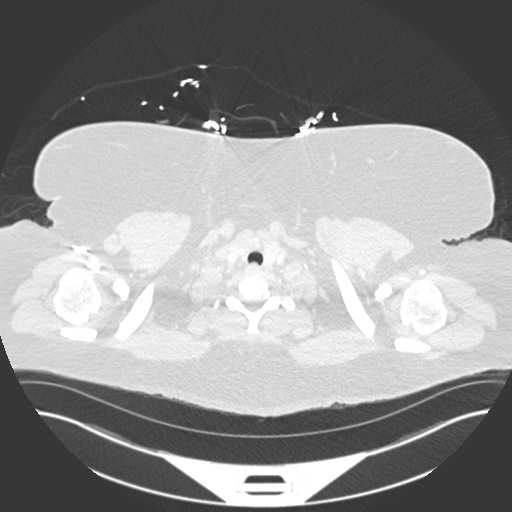

[Series 7: coronal mpr · coronal · 0.53mm/px · 2 of 103 slices shown]
[im 35/103  soft-tissue]
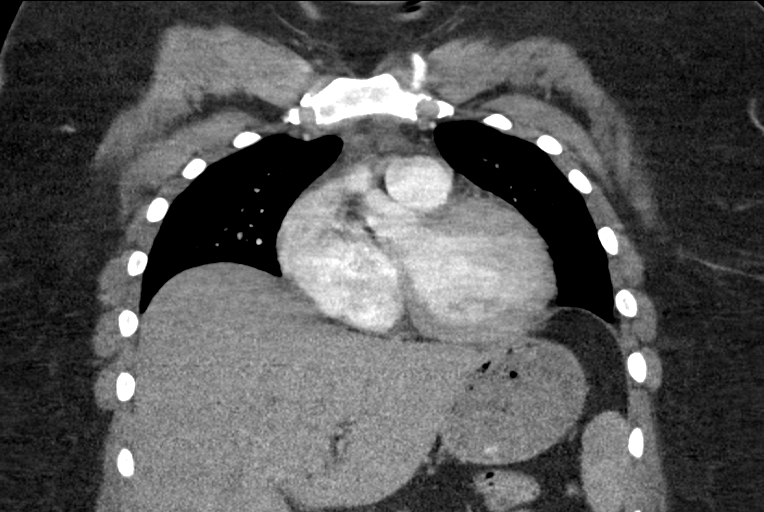
[im 69/103  soft-tissue]
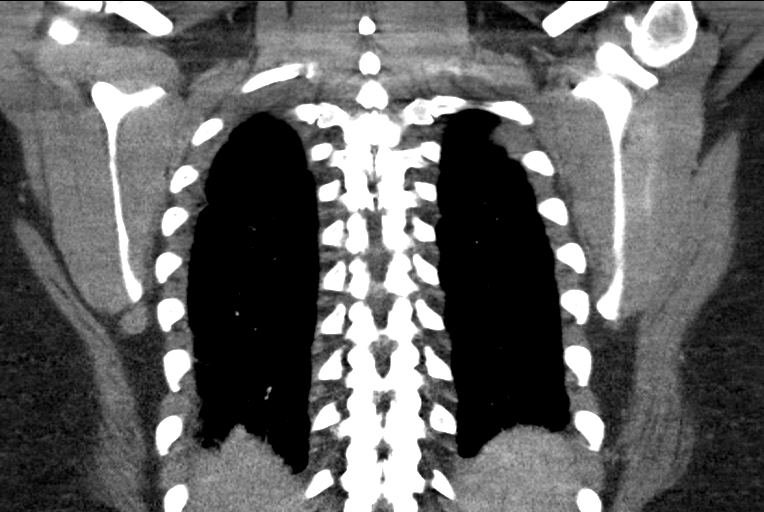

[17 of 46 positions shown; findings below may reference images not displayed]

FINDINGS: Large body habitus results in overall noisy image quality further
degraded by respiratory motion.

CARDIOVASCULAR: Sub adequate contrast opacification of the pulmonary
artery's (main pulmonary artery is 194 Hounsfield units, target is
250 Hounsfield units). Main pulmonary artery is mildly enlarged at
3.2 cm . Persistent filling defects and RIGHT middle RIGHT lower
lobe, possibly occlusive in RIGHT lower lobe subsegmental branches.
No definite LEFT pulmonary emboli. Heart size is normal, no right
heart strain. No pericardial effusions. Thoracic aorta is normal
course and caliber, unremarkable.

MEDIASTINUM/NODES: No lymphadenopathy by CT size criteria.

LUNGS/PLEURA: Tracheobronchial tree is patent, no pneumothorax.
Trace RIGHT pleural effusion. Small patchy RIGHT lower lobe
consolidation is smaller, in a background of heterogeneous lung
attenuation.

UPPER ABDOMEN: Included view of the abdomen is nonacute. Status post
cholecystectomy.

MUSCULOSKELETAL: Visualized soft tissues and included osseous
structures are nonacute. 15 mm nodule LEFT breast could represent
intramammary lymph node, given patient's age consider ultrasound as
clinically indicated.

Review of the MIP images confirms the above findings.
IMPRESSION: Technically limited examination due to bolus timing, habitus and
respiratory motion.

Subacute RIGHT middle and RIGHT lower lobe possibly occlusive
pulmonary emboli without further propagation or new pulmonary
emboli.

RIGHT lower lobe probable resolving pneumonia, decreased. Small
RIGHT pleural effusion.

Heterogeneous lung attenuation compatible with small airway disease.

## 2016-03-26 MED ORDER — AMOXICILLIN-POT CLAVULANATE 875-125 MG PO TABS: 1.0000 | ORAL_TABLET | Freq: Two times a day (BID) | ORAL | 0 refills | Status: AC

## 2016-03-26 MED ORDER — ENOXAPARIN SODIUM 40 MG/0.4ML ~~LOC~~ SOLN: 40.0000 mg | Freq: Two times a day (BID) | SUBCUTANEOUS | 2 refills | Status: DC

## 2016-03-26 MED ORDER — OXYCODONE HCL 5 MG PO TABS: 5.0000 mg | ORAL_TABLET | ORAL | 0 refills | Status: DC | PRN

## 2016-03-26 MED ORDER — OXYCODONE HCL 5 MG PO TABS: 5.0000 mg | ORAL_TABLET | ORAL | Status: DC | PRN

## 2016-03-26 MED ORDER — IOPAMIDOL (ISOVUE-370) INJECTION 76%
100.0000 mL | Freq: Once | INTRAVENOUS | Status: AC | PRN
  Administered 2016-03-26: 100 mL via INTRAVENOUS

## 2016-03-26 MED ORDER — HEPARIN BOLUS VIA INFUSION
3300.0000 [IU] | Freq: Once | INTRAVENOUS | Status: AC
  Administered 2016-03-26: 3300 [IU] via INTRAVENOUS
  Filled 2016-03-26: qty 3300

## 2016-03-26 MED ORDER — ENOXAPARIN SODIUM 120 MG/0.8ML ~~LOC~~ SOLN
120.0000 mg | Freq: Two times a day (BID) | SUBCUTANEOUS | Status: DC
  Administered 2016-03-26: 120 mg via SUBCUTANEOUS
  Filled 2016-03-26: qty 0.8

## 2016-03-26 MED ORDER — ENOXAPARIN SODIUM 150 MG/ML ~~LOC~~ SOLN: 1.0000 mg/kg | Freq: Two times a day (BID) | SUBCUTANEOUS | Status: DC

## 2016-03-26 MED ORDER — HYDROMORPHONE HCL 1 MG/ML IJ SOLN
1.0000 mg | Freq: Once | INTRAMUSCULAR | Status: AC
  Administered 2016-03-26: 1 mg via INTRAVENOUS
  Filled 2016-03-26: qty 1

## 2016-03-26 MED ORDER — ENOXAPARIN SODIUM 120 MG/0.8ML ~~LOC~~ SOLN: 120.0000 mg | Freq: Two times a day (BID) | SUBCUTANEOUS | 2 refills | Status: DC

## 2016-03-26 MED ORDER — ENOXAPARIN SODIUM 80 MG/0.8ML ~~LOC~~ SOLN: 160.0000 mg | Freq: Two times a day (BID) | SUBCUTANEOUS | 2 refills | Status: DC

## 2016-03-26 MED ORDER — METFORMIN HCL ER 500 MG PO TB24: 1000.0000 mg | ORAL_TABLET | Freq: Every day | ORAL | Status: DC

## 2016-03-26 MED ORDER — ENOXAPARIN SODIUM 40 MG/0.4ML ~~LOC~~ SOLN
40.0000 mg | Freq: Two times a day (BID) | SUBCUTANEOUS | Status: DC
  Administered 2016-03-26: 40 mg via SUBCUTANEOUS
  Filled 2016-03-26: qty 0.4

## 2016-03-26 NOTE — Progress Notes (Signed)
Patient ambulated full length of nurses station twice. Sob with exertion, sat dropped to 90%on ra quickly recovered at rest

## 2016-03-26 NOTE — Progress Notes (Signed)
Patient demonstrated administering  Self lovenox injections.

## 2016-03-26 NOTE — Progress Notes (Signed)
[facility]  Daily Progress Note   Subjective: 1 Day Post-Op: IVC Filter Insertion  Patient without complaint. Being discharged home today.   Objective: Vitals:   03/26/16 0330 03/26/16 0821 03/26/16 1126 03/26/16 1256  BP: 132/79 (!) 109/55 123/61   Pulse: 89 76 91   Resp: 16 18 16    Temp: 97.8 F (36.6 C) 98 F (36.7 C) 97.2 F (36.2 C)   TempSrc: Oral Oral Oral   SpO2: 98% 97% 100% 90%  Weight:      Height:        Intake/Output Summary (Last 24 hours) at 03/26/16 1526 Last data filed at 03/26/16 1225  Gross per 24 hour  Intake           694.63 ml  Output              450 ml  Net           244.63 ml   Physical Exam: A&Ox3, NAD CV: RRR Pulmonary: CTA Bilaterally Abdomen: Soft, Nontender, Nondistended Right Groin: Dressing in place. No swelling or drainage noted.  Vascular: Warm, Non-tender, moderate bilateral edema   Laboratory: CBC    Component Value Date/Time   WBC 8.2 03/26/2016 0131   HGB 9.3 (L) 03/26/2016 0131   HCT 29.4 (L) 03/26/2016 0131   PLT 344 03/26/2016 0131   BMET    Component Value Date/Time   NA 137 03/26/2016 0131   K 3.5 03/26/2016 0131   CL 108 03/26/2016 0131   CO2 22 03/26/2016 0131   GLUCOSE 138 (H) 03/26/2016 0131   BUN 11 03/26/2016 0131   CREATININE 0.62 03/26/2016 0131   CALCIUM 9.0 03/26/2016 0131   GFRNONAA >60 03/26/2016 0131   GFRAA >60 03/26/2016 0131   Assessment/Planning: 30 year old female s/p IVC filter placement - stable 1) patient to follow up in 8 months for IVC filter removal discussion 2) Will sign off for now - please re-consult if needed  Marcelle Overlie PA-C 03/26/2016 3:26 PM

## 2016-03-26 NOTE — Care Management (Addendum)
Patient to discharge home today on Lovenox.  In order to obtain the correct dose of 160mg , two separate prescriptions were sent to [facility].  Patient's insurance company looks at this like two separate prescriptions which would cost patient 47 dollar each- total 94.00 a month. There is a 300mg  dose syringe available in which a specific amount of medication would have to be wasted to achieve the correct dose but the cost would be 47.00.  Patient choose this option.  She is agreeable for CM to contact some drug stores in [facility] and no one has it in stock.  [facility] on [address] will be able to obtain 80mg  syringes by 12.15.  Patient would inject 160mg  (2 syringes) twice daily for a total of 120 syringes per 30 days.   Spoke with [facility] pharmacy and informed that patient can be sent home with 3 doses - enough. Contacted [facility] and informe even with this dosing, the copay will still be 47 dollars.  Informed patient and primary nurse- agreeable.  Attending will escribe the script.

## 2016-03-26 NOTE — Discharge Summary (Signed)
[facility] [place] NAME: Heather Cherry    MR#:  [id number]  DATE OF BIRTH:  1985/05/14  DATE OF ADMISSION:  03/24/2016 ADMITTING PHYSICIAN: Nicholes Mango, MD  DATE OF DISCHARGE: No discharge date for patient encounter.  PRIMARY CARE PHYSICIAN: Suzanna Obey, MD     ADMISSION DIAGNOSIS:  Pulmonary infarction ([place]) [I26.99] Other acute pulmonary embolism without acute cor pulmonale (HCC) [I26.99]  DISCHARGE DIAGNOSIS:  Principal Problem:   Pulmonary embolism (HCC) Active Problems:   DVT (deep vein thrombosis) in pregnancy ([place])   Anemia   Hypokalemia   Morbid obesity ([place])   SECONDARY DIAGNOSIS:   Past Medical History:  Diagnosis Date  . Abnormal vaginal bleeding   . Anxiety   . Depression   . Diabetes mellitus without complication ([place])   . DVT (deep venous thrombosis) ([place])   . Fibroid   . Hypertension   . Obesity   . Pulmonary emboli ([place])   . Sinus congestion     .pro HOSPITAL COURSE:    The patient is 30 year old African-American female with past medical history significant for history of DVT, PE, who failed therapy to Xarelto and Coumadin, who presents to the hospital with complaints of sudden onset of chest pains worsened his inspiration, shortness of breath. The angiogram. In emergency room revealed pulmonary emboli, additional left-sided clot, right lung base infarct. Patient was admitted to the hospital for anticoagulation with heparin intravenously. Vascular surgery consultation was requested, IVC filter was placed 13th of December 2017. Patient still complained of right-sided chest pain with inspiration, mild shortness of breath on the day of discharge, however, patient's O2 sats were 91% on room air on exertion. The patient was seen by oncologist, who recommended to continue Lovenox 1 mg/kg twice daily dose at least for 3 months, then follow-up with primary oncologist for further recommendations. Patient was  felt to be stable to be discharged home today Discussion by problem: #1. Recurrent pulmonary embolism, patient was managed on heparin intravenously while in the hospital, she is being discharged on 1 g/kg Lovenox subcutaneously twice daily dose, as had recurrent pulmonary embolism on therapeutic Coumadin, failed Xarelto therapy, status post IVC filter placement 03/25/2016,  hypercoagulable workup is pending, patient is to follow-up with primary oncologist as outpatient for further recommendations. She is to continue Lovenox for at least 3 months, per [facility] oncologist recommendation.  #2. Bilateral lower extremity DVT, status post IVC filter placement, now on Lovenox subcutaneously therapeutic doses #3. Hypokalemia, supplemented orally, resolved #4 anemia, stable, follow-up as outpatient , no active bleeding was noted #5. Morbid obesity, TSH was normal, lipid panel revealed LDL of 73    DISCHARGE CONDITIONS:   Stable  CONSULTS OBTAINED:  Treatment Team:  Algernon Huxley, MD Cammie Sickle, MD  DRUG ALLERGIES:   Allergies  Allergen Reactions  . Lisinopril Cough    DISCHARGE MEDICATIONS:   Current Discharge Medication List    START taking these medications   Details  enoxaparin (LOVENOX) 80 MG/0.8ML injection Inject 1.6 mLs (160 mg total) into the skin every 12 (twelve) hours. Qty: 120 Syringe, Refills: 2    oxyCODONE (OXY IR/ROXICODONE) 5 MG immediate release tablet Take 1 tablet (5 mg total) by mouth every 4 (four) hours as needed for moderate pain, severe pain or breakthrough pain. Qty: 30 tablet, Refills: 0      CONTINUE these medications which have CHANGED   Details  amoxicillin-clavulanate (AUGMENTIN) 875-125 MG tablet Take 1 tablet by  mouth every 12 (twelve) hours. Qty: 14 tablet, Refills: 0      CONTINUE these medications which have NOT CHANGED   Details  acetaZOLAMIDE (DIAMOX) 250 MG tablet Take 500 mg by mouth 2 (two) times daily.    ferrous sulfate 325 (65 FE)  MG tablet Take 325 mg by mouth 3 (three) times daily with meals.    folic acid (FOLVITE) 1 MG tablet Take 1 tablet (1 mg total) by mouth daily. Qty: 30 tablet, Refills: 0    gabapentin (NEURONTIN) 300 MG capsule Take 300 mg by mouth 3 (three) times daily.    metFORMIN (GLUCOPHAGE-XR) 500 MG 24 hr tablet Take 1,000 mg by mouth daily with breakfast.    omeprazole (PRILOSEC) 20 MG capsule Take 20 mg by mouth daily.    polyethylene glycol (MIRALAX / GLYCOLAX) packet Take 17 g by mouth daily. Qty: 14 each, Refills: 0    clonazePAM (KLONOPIN) 0.5 MG tablet Take 0.5 mg by mouth 2 (two) times daily as needed for anxiety.    guaiFENesin (MUCINEX) 600 MG 12 hr tablet Take 1 tablet (600 mg total) by mouth 2 (two) times daily. Qty: 20 tablet, Refills: 0      STOP taking these medications     HYDROcodone-acetaminophen (NORCO/VICODIN) 5-325 MG tablet      warfarin (COUMADIN) 5 MG tablet      warfarin (COUMADIN) 7.5 MG tablet          DISCHARGE INSTRUCTIONS:    Patient is to follow-up with primary oncologist, primary care physician within one week after discharge  If you experience worsening of your admission symptoms, develop shortness of breath, life threatening emergency, suicidal or homicidal thoughts you must seek medical attention immediately by calling 911 or calling your MD immediately  if symptoms less severe.  You Must read complete instructions/literature along with all the possible adverse reactions/side effects for all the Medicines you take and that have been prescribed to you. Take any new Medicines after you have completely understood and accept all the possible adverse reactions/side effects.   Please note  You were cared for by a hospitalist during your hospital stay. If you have any questions about your discharge medications or the care you received while you were in the hospital after you are discharged, you can call the unit and asked to speak with the hospitalist on  call if the hospitalist that took care of you is not available. Once you are discharged, your primary care physician will handle any further medical issues. Please note that NO REFILLS for any discharge medications will be authorized once you are discharged, as it is imperative that you return to your primary care physician (or establish a relationship with a primary care physician if you do not have one) for your aftercare needs so that they can reassess your need for medications and monitor your lab values.    Today   CHIEF COMPLAINT:   Chief Complaint  Patient presents with  . Chest Pain    HISTORY OF PRESENT ILLNESS:  Heather Cherry  is a 30 y.o. female with a known history of DVT, PE, who failed therapy to Xarelto and Coumadin, who presents to the hospital with complaints of sudden onset of chest pains worsened his inspiration, shortness of breath. The angiogram. In emergency room revealed pulmonary emboli, additional left-sided clot, right lung base infarct. Patient was admitted to the hospital for anticoagulation with heparin intravenously. Vascular surgery consultation was requested, IVC filter was placed 13th of December  2017. Patient still complained of right-sided chest pain with inspiration, mild shortness of breath on the day of discharge, however, patient's O2 sats were 91% on room air on exertion. The patient was seen by oncologist, who recommended to continue Lovenox 1 mg/kg twice daily dose at least for 3 months, then follow-up with primary oncologist for further recommendations. Patient was felt to be stable to be discharged home today Discussion by problem: #1. Recurrent pulmonary embolism, patient was managed on heparin intravenously while in the hospital, she is being discharged on 1 g/kg Lovenox subcutaneously twice daily dose, as had recurrent pulmonary embolism on therapeutic Coumadin, failed Xarelto therapy, status post IVC filter placement 03/25/2016,  hypercoagulable workup  is pending, patient is to follow-up with primary oncologist as outpatient for further recommendations. She is to continue Lovenox for at least 3 months, per [facility] oncologist recommendation.  #2. Bilateral lower extremity DVT, status post IVC filter placement, now on Lovenox subcutaneously therapeutic doses #3. Hypokalemia, supplemented orally, resolved #4 anemia, stable, follow-up as outpatient , no active bleeding was noted #5. Morbid obesity, TSH was normal, lipid panel revealed LDL of 73    VITAL SIGNS:  Blood pressure 123/61, pulse 91, temperature 97.2 F (36.2 C), temperature source Oral, resp. rate 16, height 5\' 9"  (1.753 m), weight (!) 164.5 kg (362 lb 9.6 oz), last menstrual period 02/27/2016, SpO2 90 %.  I/O:   Intake/Output Summary (Last 24 hours) at 03/26/16 1614 Last data filed at 03/26/16 1225  Gross per 24 hour  Intake           694.63 ml  Output              450 ml  Net           244.63 ml    PHYSICAL EXAMINATION:  GENERAL:  30 y.o.-year-old patient lying in the bed with no acute distress.  EYES: Pupils equal, round, reactive to light and accommodation. No scleral icterus. Extraocular muscles intact.  HEENT: Head atraumatic, normocephalic. Oropharynx and nasopharynx clear.  NECK:  Supple, no jugular venous distention. No thyroid enlargement, no tenderness.  LUNGS: Normal breath sounds bilaterally, no wheezing, rales,rhonchi or crepitation. No use of accessory muscles of respiration.  CARDIOVASCULAR: S1, S2 normal. No murmurs, rubs, or gallops.  ABDOMEN: Soft, non-tender, non-distended. Bowel sounds present. No organomegaly or mass.  EXTREMITIES: No pedal edema, cyanosis, or clubbing.  NEUROLOGIC: Cranial nerves II through XII are intact. Muscle strength 5/5 in all extremities. Sensation intact. Gait not checked.  PSYCHIATRIC: The patient is alert and oriented x 3.  SKIN: No obvious rash, lesion, or ulcer.   DATA REVIEW:   CBC  Recent Labs Lab 03/26/16 0131  WBC  8.2  HGB 9.3*  HCT 29.4*  PLT 344    Chemistries   Recent Labs Lab 03/25/16 0249 03/26/16 0131  NA 136 137  K 3.0* 3.5  CL 106 108  CO2 23 22  GLUCOSE 149* 138*  BUN 8 11  CREATININE 0.57 0.62  CALCIUM 8.9 9.0  MG  --  1.8  AST 14*  --   ALT 11*  --   ALKPHOS 69  --   BILITOT 0.7  --     Cardiac Enzymes  Recent Labs Lab 03/26/16 0131  TROPONINI <0.03    Microbiology Results  Results for orders placed or performed during the hospital encounter of 03/17/16  MRSA PCR Screening     Status: None   Collection Time: 03/17/16 11:16 PM  Result Value  Ref Range Status   MRSA by PCR NEGATIVE NEGATIVE Final    Comment:        The GeneXpert MRSA Assay (FDA approved for NASAL specimens only), is one component of a comprehensive MRSA colonization surveillance program. It is not intended to diagnose MRSA infection nor to guide or monitor treatment for MRSA infections.     RADIOLOGY:  Ct Angio Chest Pe W Or Wo Contrast  Result Date: 03/26/2016 CLINICAL DATA:  Worsening chest pain and shortness of breath. History of pulmonary embolism. Status post inferior vena cava filter placement March 25, 2016. EXAM: CT ANGIOGRAPHY CHEST WITH CONTRAST TECHNIQUE: Multidetector CT imaging of the chest was performed using the standard protocol during bolus administration of intravenous contrast. Multiplanar CT image reconstructions and MIPs were obtained to evaluate the vascular anatomy. CONTRAST:  100 cc Isovue 370 COMPARISON:  CT angiogram of the chest March 24, 2016 in CT angiogram of the chest March 12, 2016. FINDINGS: Large body habitus results in overall noisy image quality further degraded by respiratory motion. CARDIOVASCULAR: Sub adequate contrast opacification of the pulmonary artery's (main pulmonary artery is 194 Hounsfield units, target is 250 Hounsfield units). Main pulmonary artery is mildly enlarged at 3.2 cm . Persistent filling defects and RIGHT middle RIGHT lower  lobe, possibly occlusive in RIGHT lower lobe subsegmental branches. No definite LEFT pulmonary emboli. Heart size is normal, no right heart strain. No pericardial effusions. Thoracic aorta is normal course and caliber, unremarkable. MEDIASTINUM/NODES: No lymphadenopathy by CT size criteria. LUNGS/PLEURA: Tracheobronchial tree is patent, no pneumothorax. Trace RIGHT pleural effusion. Small patchy RIGHT lower lobe consolidation is smaller, in a background of heterogeneous lung attenuation. UPPER ABDOMEN: Included view of the abdomen is nonacute. Status post cholecystectomy. MUSCULOSKELETAL: Visualized soft tissues and included osseous structures are nonacute. 15 mm nodule LEFT breast could represent intramammary lymph node, given patient's age consider ultrasound as clinically indicated. Review of the MIP images confirms the above findings. IMPRESSION: Technically limited examination due to bolus timing, habitus and respiratory motion. Subacute RIGHT middle and RIGHT lower lobe possibly occlusive pulmonary emboli without further propagation or new pulmonary emboli. RIGHT lower lobe probable resolving pneumonia, decreased. Small RIGHT pleural effusion. Heterogeneous lung attenuation compatible with small airway disease. Electronically Signed   By: Elon Alas M.D.   On: 03/26/2016 03:05   Ct Angio Chest Pe W And/or Wo Contrast  Result Date: 03/24/2016 CLINICAL DATA:  Initial evaluation for acute chest pain. History of recent PE. EXAM: CT ANGIOGRAPHY CHEST WITH CONTRAST TECHNIQUE: Multidetector CT imaging of the chest was performed using the standard protocol during bolus administration of intravenous contrast. Multiplanar CT image reconstructions and MIPs were obtained to evaluate the vascular anatomy. CONTRAST:  75 cc of Isovue 370. COMPARISON:  Prior CT from 03/12/2016 FINDINGS: Cardiovascular: Intrathoracic aorta of normal caliber and appearance without acute abnormality. Visualized great vessels within  normal limits. Heart size upper limits of normal. No pericardial effusion. Main pulmonary artery at upper limits of normal for size measuring 2.9 cm in diameter. There are persistent subtle heterogeneous filling defects within several segmental pulmonary artery supplying the right lower lobe, most evident on coronal sequence (series 7, image 60). These are nonocclusive. Additional residual clot within segmental artery supplying the right middle lobe as well (series 7, image 44). Few additional scattered possible filling defects noted within segmental artery supplying the upper lobes as well (Series 9, image 69 62 on the right, series 9, image 68 on the left. No evidence for right heart  strain on today's study (RV/LV ratio = 0.8 on today's). Mediastinum/Nodes: Visualized thyroid is normal. No pathologically enlarged mediastinal, hilar, or axillary lymph nodes. Esophagus within normal limits. Lungs/Pleura: Pleural-based parenchymal [place] at the posterior reader right lung base, which may reflect an evolving pulmonary infarct (series 6, image 68). Lungs are otherwise clear. No other airspace consolidation. No pulmonary edema or pleural effusion. Mild bibasilar air trapping. No pneumothorax. No other worrisome pulmonary nodule or mass. Upper Abdomen: Visualized upper abdomen within normal limits. Patient is status post cholecystectomy. Musculoskeletal: No acute osseous abnormality. No worrisome lytic or blastic osseous lesions. Review of the MIP images confirms the above findings. IMPRESSION: 1. Persistent nonocclusive pulmonary emboli involving the right middle and lower lobes as above, with additional possible tiny nonocclusive emboli within the upper lobes as well. Overall, these are largely subacute in appearance as compared to previous exam, although the left sided clot would be new from previous. No evidence for right heart strain on today's study. 2. Wedge-shaped opacity within the peripheral right lung base,  suspected to reflect an evolving pulmonary infarct. Critical Value/emergent results were called by telephone at the time of interpretation on 03/24/2016 at 7:23 pm to Dr. Rudene Re , who verbally acknowledged these results. Electronically Signed   By: Jeannine Boga M.D.   On: 03/24/2016 19:28    EKG:   Orders placed or performed during the hospital encounter of 03/24/16  . EKG 12-Lead  . EKG 12-Lead  . EKG - 12 lead  . EKG - 12 lead      Management plans discussed with the patient, family and they are in agreement.  CODE STATUS:     Code Status Orders        Start     Ordered   03/24/16 2207  Full code  Continuous     03/24/16 2206    Code Status History    Date Active Date Inactive Code Status Order ID Comments User Context   03/18/2016 12:40 AM 03/20/2016  7:12 PM Full Code [id number]  Rise Patience, MD Inpatient   03/12/2016 10:44 PM 03/17/2016  4:33 PM Full Code [id number]  Lance Coon, MD Inpatient      TOTAL TIME TAKING CARE OF THIS PATIENT: 40 minutes.    Theodoro Grist M.D on 03/26/2016 at 4:14 PM  Between 7am to 6pm - Pager - [phone]  After 6pm go to [email or web address] - password [email or web address] [facility]  [facility]  Office  [phone]  CC: Primary care physician; Suzanna Obey, MD

## 2016-03-26 NOTE — Consult Note (Signed)
[place] CONSULT NOTE  Patient Care Team: Katherina Mires, MD as PCP - General (Family Medicine)  CHIEF COMPLAINTS/PURPOSE OF CONSULTATION: Recurrent DVT/PE.  HISTORY OF PRESENTING ILLNESS:  Heather Cherry 30 y.o.  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  Patient states that she noted to have right lower extruded swelling/pain day after she had CSF fluid drawn for workup of pseudotumor cerebri. Patient was diagnosed with right lower extremity DVT started on Xarelto. 3 weeks later patient had pleuritic chest pain- that led to a CTA that showed PE. Patient was then admitted to [facility] when she was started on Coumadin. She was discharged and INR was therapeutic.  At this admission noted to have worsening pleuritic pain INR was found to be therapeutic on admission- 2.5. The CT that this admission noted to have- subacute PE bilateral although left-sided new blood clot. In the right lung base pulmonary infarct. Given the failure of anticoagulation with Coumadin patient was started on IV heparin. Patient also had IVC filter placement.  Patient seems to be better pain better controlled. Denies any difficulty shortness of breath.  Patient has a history of prior miscarriage. No first-degree relatives with thromboembolic events. Patient's recent blood work on December 7 showed positive for lupus anticoagulant [patient Coumadin]. Negative for prothrombin gene mutation and factor V Leiden.  ROS: A complete 10 point review of system is done which is negative except mentioned above in history of present illness  MEDICAL HISTORY:  Past Medical History:  Diagnosis Date  . Abnormal vaginal bleeding   . Anxiety   . Depression   . Diabetes mellitus without complication ([place])   . DVT (deep venous thrombosis) ([place])   .  Fibroid   . Hypertension   . Obesity   . Pulmonary emboli ([place])   . Sinus congestion     SURGICAL HISTORY: Past Surgical History:  Procedure Laterality Date  . CHOLECYSTECTOMY    . WISDOM TOOTH EXTRACTION      SOCIAL HISTORY: Social History   Social History  . Marital status: Single    Spouse name: N/A  . Number of children: N/A  . Years of education: N/A   Occupational History  . Not on file.   Social History Main Topics  . Smoking status: Never Smoker  . Smokeless tobacco: Never Used  . Alcohol use Yes     Comment: occas. social  . Drug use: No  . Sexual activity: Yes     Comment: Last intercourse, 3 days ago. Not taking BCP since October   Other Topics Concern  . Not on file   Social History Narrative  . No narrative on file    FAMILY HISTORY: Family History  Problem Relation Age of Onset  . Cancer Paternal Grandfather   . Rheumatologic disease Neg Hx     ALLERGIES:  is allergic to lisinopril.  MEDICATIONS:  Current Facility-Administered Medications  Medication Dose Route Frequency Provider Last Rate Last Dose  . acetaminophen (TYLENOL) tablet 650 mg  650 mg Oral Q6H PRN Nicholes Mango, MD       Or  . acetaminophen (TYLENOL) suppository 650 mg  650 mg Rectal Q6H PRN Nicholes Mango, MD      . amoxicillin-clavulanate (AUGMENTIN) 875-125 MG per tablet 1 tablet  1 tablet Oral Q12H Nicholes Mango, MD   1  tablet at 03/26/16 0936  . clonazePAM (KLONOPIN) tablet 0.5 mg  0.5 mg Oral BID PRN Nicholes Mango, MD      . enoxaparin (LOVENOX) injection 120 mg  120 mg Subcutaneous Q12H Theodoro Grist, MD   120 mg at 03/26/16 1218   And  . enoxaparin (LOVENOX) injection 40 mg  40 mg Subcutaneous Q12H Theodoro Grist, MD   40 mg at 03/26/16 1217  . ferrous sulfate tablet 325 mg  325 mg Oral TID WC Nicholes Mango, MD   325 mg at 03/26/16 1217  . folic acid (FOLVITE) tablet 1 mg  1 mg Oral Daily Nicholes Mango, MD   1 mg at 03/26/16 0936  . gabapentin (NEURONTIN) capsule 300 mg  300 mg Oral  TID Nicholes Mango, MD   300 mg at 03/26/16 0936  . guaiFENesin (MUCINEX) 12 hr tablet 600 mg  600 mg Oral BID Nicholes Mango, MD   600 mg at 03/26/16 0936  . insulin aspart (novoLOG) injection 0-5 Units  0-5 Units Subcutaneous QHS Aruna Gouru, MD      . insulin aspart (novoLOG) injection 0-9 Units  0-9 Units Subcutaneous TID WC Nicholes Mango, MD   1 Units at 03/26/16 1217  . [START ON 03/28/2016] metFORMIN (GLUCOPHAGE-XR) 24 hr tablet 1,000 mg  1,000 mg Oral Q breakfast Napoleon Form, RPH      . nitroGLYCERIN (NITROSTAT) SL tablet 0.4 mg  0.4 mg Sublingual Q5 min PRN Theodoro Grist, MD   0.4 mg at 03/25/16 2258  . ondansetron (ZOFRAN) tablet 4 mg  4 mg Oral Q6H PRN Nicholes Mango, MD       Or  . ondansetron (ZOFRAN) injection 4 mg  4 mg Intravenous Q6H PRN Aruna Gouru, MD      . oxyCODONE (Oxy IR/ROXICODONE) immediate release tablet 5 mg  5 mg Oral Q4H PRN Theodoro Grist, MD      . pantoprazole (PROTONIX) EC tablet 40 mg  40 mg Oral Daily Nicholes Mango, MD   40 mg at 03/26/16 0936  . polyethylene glycol (MIRALAX / GLYCOLAX) packet 17 g  17 g Oral Daily Nicholes Mango, MD   17 g at 03/26/16 0936  . potassium chloride SA (K-DUR,KLOR-CON) CR tablet 20 mEq  20 mEq Oral Daily Theodoro Grist, MD   20 mEq at 03/26/16 0936  . sodium chloride flush (NS) 0.9 % injection 3 mL  3 mL Intravenous Q12H Nicholes Mango, MD   3 mL at 03/26/16 1000      .  PHYSICAL EXAMINATION:  Vitals:   03/26/16 0821 03/26/16 1126  BP: (!) 109/55 123/61  Pulse: 76 91  Resp: 18 16  Temp: 98 F (36.7 C) 97.2 F (36.2 C)   Filed Weights   03/24/16 1657 03/24/16 2203  Weight: (!) 377 lb (171 kg) (!) 362 lb 9.6 oz (164.5 kg)    GENERAL: Well-nourished well-developed; Alert, no distress and comfortable.   Alone. Obese. EYES: no pallor or icterus OROPHARYNX: no thrush or ulceration. NECK: supple, no masses felt LYMPH:  no palpable lymphadenopathy in the cervical, axillary or inguinal regions LUNGS: decreased breath sounds to  auscultation at bases and  No wheeze or crackles HEART/CVS: regular rate & rhythm and no murmurs; No lower extremity edema ABDOMEN: abdomen soft, non-tender and normal bowel sounds Musculoskeletal:no cyanosis of digits and no clubbing  PSYCH: alert & oriented x 3 with fluent speech NEURO: no focal motor/sensory deficits SKIN:  no rashes or significant lesions  LABORATORY DATA:  I have  reviewed the data as listed Lab Results  Component Value Date   WBC 8.2 03/26/2016   HGB 9.3 (L) 03/26/2016   HCT 29.4 (L) 03/26/2016   MCV 68.8 (L) 03/26/2016   PLT 344 03/26/2016    Recent Labs  03/12/16 1715  03/18/16 0724  03/24/16 1720 03/25/16 0249 03/26/16 0131  NA 137  < > 139  < > 135 136 137  K 2.3*  < > 3.7  < > 3.5 3.0* 3.5  CL 102  < > 106  < > 103 106 108  CO2 26  < > 25  < > 21* 23 22  GLUCOSE 123*  < > 135*  < > 95 149* 138*  BUN 10  < > 8  < > 9 8 11   CREATININE 0.77  < > 0.77  < > 0.75 0.57 0.62  CALCIUM 9.1  < > 9.0  < > 9.3 8.9 9.0  GFRNONAA >60  < > >60  < > >60 >60 >60  GFRAA >60  < > >60  < > >60 >60 >60  PROT 7.5  --  6.6  --   --  6.8  --   ALBUMIN 3.4*  --  2.9*  --   --  3.2*  --   AST 17  --  24  --   --  14*  --   ALT 12*  --  25  --   --  11*  --   ALKPHOS 66  --  111  --   --  69  --   BILITOT 0.5  --  0.6  --   --  0.7  --   BILIDIR <0.1*  --   --   --   --   --   --   IBILI NOT CALCULATED  --   --   --   --   --   --   < > = values in this interval not displayed.  RADIOGRAPHIC STUDIES: I have personally reviewed the radiological images as listed and agreed with the findings in the report. Dg Chest 2 View  Result Date: 03/17/2016 CLINICAL DATA:  Chest pain and dyspnea for 5 days. EXAM: CHEST  2 VIEW COMPARISON:  03/12/2016 FINDINGS: Slight patchy opacity in the central right lung may represent infectious infiltrate, atelectasis, pulmonary infarction. This is accentuated by a shallow degree of inspiration. The left lung is clear. There is no pleural  effusion. Hilar and mediastinal contours are unremarkable and unchanged. IMPRESSION: Patchy airspace opacity in the right lung. Considerations include infectious infiltrate, atelectasis, pulmonary infarction. Electronically Signed   By: Andreas Newport M.D.   On: 03/17/2016 21:11   Dg Chest 2 View  Result Date: 03/12/2016 CLINICAL DATA:  Chest pain under the right breast EXAM: CHEST  2 VIEW COMPARISON:  None. FINDINGS: The heart size and mediastinal contours are within normal limits. Both lungs are clear. The visualized skeletal structures are unremarkable. Surgical clips in the right upper quadrant. IMPRESSION: No active cardiopulmonary disease. Electronically Signed   By: Donavan Foil M.D.   On: 03/12/2016 18:34   Ct Chest Wo Contrast  Result Date: 03/18/2016 CLINICAL DATA:  Severe dyspnea since yesterday. History of DVT. Diagnostic pulmonary embolus last week. EXAM: CT CHEST WITHOUT CONTRAST TECHNIQUE: Multidetector CT imaging of the chest was performed following the standard protocol without IV contrast. COMPARISON:  03/12/2016 CT FINDINGS: Cardiovascular: The heart is top-normal in size. No coronary arteriosclerosis. Normal takeoff of the  great vessels. No pericardial effusion. Mediastinum/Nodes: No supraclavicular, axillary nor mediastinal adenopathy. Nor aortic aneurysm. Visualized trachea and mainstem bronchi are patent. The esophagus is unremarkable. Lungs/Pleura: New right upper lobe and right lower lobe pneumonic consolidations with right middle and bilateral lower lobe atelectasis. Small right parapneumonic effusion. No pneumothorax. Upper Abdomen: Negative for acute abnormalities Musculoskeletal: Negative IMPRESSION: New right upper and lower lobe pneumonic consolidations with atelectasis in the right middle lobe and both lower lobes. Small right parapneumonic effusion. Electronically Signed   By: Ashley Royalty M.D.   On: 03/18/2016 17:12   Ct Angio Chest Pe W Or Wo Contrast  Result Date:  03/26/2016 CLINICAL DATA:  Worsening chest pain and shortness of breath. History of pulmonary embolism. Status post inferior vena cava filter placement March 25, 2016. EXAM: CT ANGIOGRAPHY CHEST WITH CONTRAST TECHNIQUE: Multidetector CT imaging of the chest was performed using the standard protocol during bolus administration of intravenous contrast. Multiplanar CT image reconstructions and MIPs were obtained to evaluate the vascular anatomy. CONTRAST:  100 cc Isovue 370 COMPARISON:  CT angiogram of the chest March 24, 2016 in CT angiogram of the chest March 12, 2016. FINDINGS: Large body habitus results in overall noisy image quality further degraded by respiratory motion. CARDIOVASCULAR: Sub adequate contrast opacification of the pulmonary artery's (main pulmonary artery is 194 Hounsfield units, target is 250 Hounsfield units). Main pulmonary artery is mildly enlarged at 3.2 cm . Persistent filling defects and RIGHT middle RIGHT lower lobe, possibly occlusive in RIGHT lower lobe subsegmental branches. No definite LEFT pulmonary emboli. Heart size is normal, no right heart strain. No pericardial effusions. Thoracic aorta is normal course and caliber, unremarkable. MEDIASTINUM/NODES: No lymphadenopathy by CT size criteria. LUNGS/PLEURA: Tracheobronchial tree is patent, no pneumothorax. Trace RIGHT pleural effusion. Small patchy RIGHT lower lobe consolidation is smaller, in a background of heterogeneous lung attenuation. UPPER ABDOMEN: Included view of the abdomen is nonacute. Status post cholecystectomy. MUSCULOSKELETAL: Visualized soft tissues and included osseous structures are nonacute. 15 mm nodule LEFT breast could represent intramammary lymph node, given patient's age consider ultrasound as clinically indicated. Review of the MIP images confirms the above findings. IMPRESSION: Technically limited examination due to bolus timing, habitus and respiratory motion. Subacute RIGHT middle and RIGHT lower  lobe possibly occlusive pulmonary emboli without further propagation or new pulmonary emboli. RIGHT lower lobe probable resolving pneumonia, decreased. Small RIGHT pleural effusion. Heterogeneous lung attenuation compatible with small airway disease. Electronically Signed   By: Elon Alas M.D.   On: 03/26/2016 03:05   Ct Angio Chest Pe W And/or Wo Contrast  Result Date: 03/24/2016 CLINICAL DATA:  Initial evaluation for acute chest pain. History of recent PE. EXAM: CT ANGIOGRAPHY CHEST WITH CONTRAST TECHNIQUE: Multidetector CT imaging of the chest was performed using the standard protocol during bolus administration of intravenous contrast. Multiplanar CT image reconstructions and MIPs were obtained to evaluate the vascular anatomy. CONTRAST:  75 cc of Isovue 370. COMPARISON:  Prior CT from 03/12/2016 FINDINGS: Cardiovascular: Intrathoracic aorta of normal caliber and appearance without acute abnormality. Visualized great vessels within normal limits. Heart size upper limits of normal. No pericardial effusion. Main pulmonary artery at upper limits of normal for size measuring 2.9 cm in diameter. There are persistent subtle heterogeneous filling defects within several segmental pulmonary artery supplying the right lower lobe, most evident on coronal sequence (series 7, image 60). These are nonocclusive. Additional residual clot within segmental artery supplying the right middle lobe as well (series 7, image 44). Few additional  scattered possible filling defects noted within segmental artery supplying the upper lobes as well (Series 9, image 69 62 on the right, series 9, image 68 on the left. No evidence for right heart strain on today's study (RV/LV ratio = 0.8 on today's). Mediastinum/Nodes: Visualized thyroid is normal. No pathologically enlarged mediastinal, hilar, or axillary lymph nodes. Esophagus within normal limits. Lungs/Pleura: Pleural-based parenchymal [place] at the posterior reader right  lung base, which may reflect an evolving pulmonary infarct (series 6, image 68). Lungs are otherwise clear. No other airspace consolidation. No pulmonary edema or pleural effusion. Mild bibasilar air trapping. No pneumothorax. No other worrisome pulmonary nodule or mass. Upper Abdomen: Visualized upper abdomen within normal limits. Patient is status post cholecystectomy. Musculoskeletal: No acute osseous abnormality. No worrisome lytic or blastic osseous lesions. Review of the MIP images confirms the above findings. IMPRESSION: 1. Persistent nonocclusive pulmonary emboli involving the right middle and lower lobes as above, with additional possible tiny nonocclusive emboli within the upper lobes as well. Overall, these are largely subacute in appearance as compared to previous exam, although the left sided clot would be new from previous. No evidence for right heart strain on today's study. 2. Wedge-shaped opacity within the peripheral right lung base, suspected to reflect an evolving pulmonary infarct. Critical Value/emergent results were called by telephone at the time of interpretation on 03/24/2016 at 7:23 pm to Dr. Rudene Re , who verbally acknowledged these results. Electronically Signed   By: Jeannine Boga M.D.   On: 03/24/2016 19:28   Ct Angio Chest Pe W Or Wo Contrast  Result Date: 03/12/2016 CLINICAL DATA:  Chest pain with inspiration. On blood thinner for RIGHT lower extremity deep vein thrombosis diagnosed February 24, 2016. EXAM: CT ANGIOGRAPHY CHEST WITH CONTRAST TECHNIQUE: Multidetector CT imaging of the chest was performed using the standard protocol during bolus administration of intravenous contrast. Multiplanar CT image reconstructions and MIPs were obtained to evaluate the vascular anatomy. CONTRAST:  75 cc Isovue 370 COMPARISON:  Chest radiograph March 12, 2016 at 1829 hours FINDINGS: Large body habitus results in overall noisy image quality. CARDIOVASCULAR: Suboptimal  contrast opacification of the pulmonary artery's (164 Hounsfield units, target is 250 Hounsfield units. Isodense pulmonary artery, aorta and pulmonary veins. Main pulmonary artery is not enlarged. Linear filling defect RIGHT lower lobar pulmonary artery, acute RIGHT lower lobe segmental nonocclusive pulmonary emboli. Probable RIGHT upper lobe nonocclusive subsegmental PE. Heart size is normal, mild RIGHT heart strain (RV/ LV = 1). No pericardial effusions. Thoracic aorta is normal course and caliber, unremarkable. MEDIASTINUM/NODES: No lymphadenopathy by CT size criteria. LUNGS/PLEURA: Tracheobronchial tree is patent, no pneumothorax. No pleural effusions, focal consolidations, pulmonary nodules or masses. Minimal lingular atelectasis. UPPER ABDOMEN: Included view of the abdomen status post cholecystectomy. Mild suspected hepatomegaly. MUSCULOSKELETAL: Visualized soft tissues and included osseous structures appear normal. Scattered Schmorl's nodes. Review of the MIP images confirms the above findings. IMPRESSION: Technically limited examination due to bolus timing and habitus. Acute nonocclusive RIGHT segmental to subsegmental pulmonary emboli. CT evidence of right heart strain (RV/LV Ratio = 1) associated with an increased risk of morbidity and mortality. Please activate Code PE by paging [phone]. Critical Value/emergent results were called by telephone at the time of interpretation on 03/12/2016 at 7:15 pm to Dr. Hinda Kehr , who verbally acknowledged these results. Electronically Signed   By: Elon Alas M.D.   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  # Recurrent PE- failure of Coumadin. Status post IVC filter. Recommend Lovenox 1 mg/kg twice a day. Patient will likely need long-term anticoagulation.   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 Which obviously needs to be repeated in 3 months or so. Patient was supposed to see a hematologist in [place]. Discussed that the patient should follow up with hematologist in [place].  # Above plan of care was discussed with the patient in detail.  # Also discussed with Dr.[place]ute.   Thank you Dr. Ether Griffins for the referral and allowing me to participate in the care of your pleasant patient. All questions were answered. The patient knows to call the clinic with any problems, questions or concerns.     Cammie Sickle, MD 03/26/2016 2:37 PM

## 2016-03-26 NOTE — Progress Notes (Signed)
Patient complaining of chest pain radiating to right arm and back. No relief given from any pain medication. MD notified. Acknowledged. New orders given. Will continue to monitor

## 2016-03-26 NOTE — Progress Notes (Signed)
ANTICOAGULATION CONSULT NOTE - Follow Up Consult  Pharmacy Consult for Heparin  Indication: pulmonary embolus  Allergies  Allergen Reactions  . Lisinopril Cough    Patient Measurements: Height: 5\' 9"  (175.3 cm) Weight: (!) 362 lb 9.6 oz (164.5 kg) IBW/kg (Calculated) : 66.2 Heparin Dosing Weight: 109 kg  Vital Signs: Temp: 98.4 F (36.9 C) (12/13 1919) Temp Source: Oral (12/13 1919) BP: 119/64 (12/13 1919) Pulse Rate: 98 (12/13 1919)  Labs:  Recent Labs  03/24/16 1720 03/24/16 2231 03/25/16 0249 03/25/16 1017 03/25/16 2213  HGB 10.2*  --  9.1*  --   --   HCT 32.8*  --  28.7*  --   --   PLT 399  --  381  --   --   APTT 57*  --   --   --   --   LABPROT 27.9*  --  27.8*  --   --   INR 2.55  --  2.54  --   --   HEPARINUNFRC  --   --  <0.10* 0.24* <0.10*  CREATININE 0.75  --  0.57  --   --   TROPONINI <0.03 <0.03 <0.03 <0.03  --     Estimated Creatinine Clearance: 172.8 mL/min (by C-G formula based on SCr of 0.57 mg/dL).   Medications:  Prescriptions Prior to Admission  Medication Sig Dispense Refill Last Dose  . acetaZOLAMIDE (DIAMOX) 250 MG tablet Take 500 mg by mouth 2 (two) times daily.   03/24/2016 at 0830  . [EXPIRED] amoxicillin-clavulanate (AUGMENTIN) 875-125 MG tablet Take 1 tablet by mouth every 12 (twelve) hours. 10 tablet 0 03/24/2016 at 1000  . ferrous sulfate 325 (65 FE) MG tablet Take 325 mg by mouth 3 (three) times daily with meals.   03/24/2016 at 0830  . folic acid (FOLVITE) 1 MG tablet Take 1 tablet (1 mg total) by mouth daily. 30 tablet 0 03/24/2016 at 0830  . gabapentin (NEURONTIN) 300 MG capsule Take 300 mg by mouth 3 (three) times daily.   03/24/2016 at 0830  . HYDROcodone-acetaminophen (NORCO/VICODIN) 5-325 MG tablet Take 1-2 tablets by mouth every 4 (four) hours as needed for moderate pain. 30 tablet 0 03/23/2016 at 2100  . metFORMIN (GLUCOPHAGE-XR) 500 MG 24 hr tablet Take 1,000 mg by mouth daily with breakfast.   03/24/2016 at 0830  .  omeprazole (PRILOSEC) 20 MG capsule Take 20 mg by mouth daily.   03/24/2016 at 1200  . polyethylene glycol (MIRALAX / GLYCOLAX) packet Take 17 g by mouth daily. 14 each 0 prn at prn  . warfarin (COUMADIN) 5 MG tablet Take 5 mg by mouth daily. 5mg  & 7.5mg  daily alternating dose   03/23/2016 at 1800  . warfarin (COUMADIN) 7.5 MG tablet Take 1 tablet (7.5 mg total) by mouth daily. 30 tablet 0 03/22/2016 at 1830  . clonazePAM (KLONOPIN) 0.5 MG tablet Take 0.5 mg by mouth 2 (two) times daily as needed for anxiety.   prn at prn  . guaiFENesin (MUCINEX) 600 MG 12 hr tablet Take 1 tablet (600 mg total) by mouth 2 (two) times daily. (Patient not taking: Reported on 03/24/2016) 20 tablet 0 Not Taking at Unknown time    Assessment: Pharmacy consulted to dose heparin in this 30 year old female admitted with PE.  Goal of Therapy:  Heparin level 0.3-0.7 units/ml Monitor platelets by anticoagulation protocol: Yes   Plan:  12/13:  HL @ 22:13 = < 0.1 Will order Heparin 3300 units IV X 1 bolus and increase  drip rate to 2600 units/hr. Will recheck HL 6 hrs after rate change.   Jancy Sprankle D 03/26/2016,1:34 AM

## 2016-03-26 NOTE — Progress Notes (Signed)
Discharge instructions along with home medication list and follow up gone over with patient. Printed rx for pain meds given to patient. Patient made aware other two prescriptions were electronically submitted. Patient will go home with 3 doses of lovenox per pharmacy. lovenox kit given to patient. Will removed iv and telemetry once ride is available. Patient to discharge home on ra.

## 2016-03-26 NOTE — Progress Notes (Signed)
           To Whom It May Concern:        Ms. Heather Cherry was hospitalized at [facility] 03/24/2016 through 03/26/2016. She is to return back to work at full capacity on 03/30/2016. Thank you for your understanding.         Sincerely,   Theodoro Grist, MD

## 2016-03-27 ENCOUNTER — Encounter: Payer: Self-pay | Admitting: Vascular Surgery

## 2016-04-27 ENCOUNTER — Encounter (INDEPENDENT_AMBULATORY_CARE_PROVIDER_SITE_OTHER): Payer: BC Managed Care – PPO | Admitting: Vascular Surgery

## 2016-05-11 ENCOUNTER — Encounter (INDEPENDENT_AMBULATORY_CARE_PROVIDER_SITE_OTHER): Payer: Self-pay | Admitting: Vascular Surgery

## 2016-05-26 ENCOUNTER — Emergency Department
Admission: EM | Admit: 2016-05-26 | Discharge: 2016-05-26 | Disposition: A | Discharge status: Home / Self Care | Payer: BC Managed Care – PPO | Attending: Emergency Medicine | Admitting: Emergency Medicine

## 2016-05-26 ENCOUNTER — Emergency Department: Payer: BC Managed Care – PPO

## 2016-05-26 ENCOUNTER — Encounter: Payer: Self-pay | Admitting: Emergency Medicine

## 2016-05-26 DIAGNOSIS — Z7901 Long term (current) use of anticoagulants: Secondary | ICD-10-CM | POA: Insufficient documentation

## 2016-05-26 DIAGNOSIS — Z79899 Other long term (current) drug therapy: Secondary | ICD-10-CM | POA: Insufficient documentation

## 2016-05-26 DIAGNOSIS — Z7984 Long term (current) use of oral hypoglycemic drugs: Secondary | ICD-10-CM | POA: Insufficient documentation

## 2016-05-26 DIAGNOSIS — E119 Type 2 diabetes mellitus without complications: Secondary | ICD-10-CM | POA: Diagnosis not present

## 2016-05-26 DIAGNOSIS — I10 Essential (primary) hypertension: Secondary | ICD-10-CM | POA: Diagnosis not present

## 2016-05-26 DIAGNOSIS — M546 Pain in thoracic spine: Secondary | ICD-10-CM | POA: Diagnosis not present

## 2016-05-26 DIAGNOSIS — R079 Chest pain, unspecified: Secondary | ICD-10-CM | POA: Insufficient documentation

## 2016-05-26 LAB — CBC WITH DIFFERENTIAL/PLATELET
Basophils Absolute: 0.1 10*3/uL (ref 0–0.1)
Basophils Relative: 1 %
EOS PCT: 2 %
Eosinophils Absolute: 0.2 10*3/uL (ref 0–0.7)
HCT: 36.6 % (ref 35.0–47.0)
HEMOGLOBIN: 11.6 g/dL — AB (ref 12.0–16.0)
LYMPHS ABS: 3 10*3/uL (ref 1.0–3.6)
LYMPHS PCT: 31 %
MCH: 23.8 pg — AB (ref 26.0–34.0)
MCHC: 31.8 g/dL — AB (ref 32.0–36.0)
MCV: 75 fL — AB (ref 80.0–100.0)
MONOS PCT: 6 %
Monocytes Absolute: 0.5 10*3/uL (ref 0.2–0.9)
NEUTROS PCT: 62 %
Neutro Abs: 6 10*3/uL (ref 1.4–6.5)
Platelets: 345 10*3/uL (ref 150–440)
RBC: 4.88 MIL/uL (ref 3.80–5.20)
RDW: 19.3 % — ABNORMAL HIGH (ref 11.5–14.5)
WBC: 9.7 10*3/uL (ref 3.6–11.0)

## 2016-05-26 LAB — BASIC METABOLIC PANEL
Anion gap: 8 (ref 5–15)
BUN: 9 mg/dL (ref 6–20)
CHLORIDE: 104 mmol/L (ref 101–111)
CO2: 25 mmol/L (ref 22–32)
Calcium: 9.1 mg/dL (ref 8.9–10.3)
Creatinine, Ser: 0.75 mg/dL (ref 0.44–1.00)
GFR calc Af Amer: >60 mL/min (ref >60)
GFR calc non Af Amer: >60 mL/min (ref >60)
GLUCOSE: 118 mg/dL — AB (ref 65–99)
POTASSIUM: 3.2 mmol/L — AB (ref 3.5–5.1)
Sodium: 137 mmol/L (ref 135–145)

## 2016-05-26 LAB — BRAIN NATRIURETIC PEPTIDE: B Natriuretic Peptide: 18 pg/mL (ref 0.0–100.0)

## 2016-05-26 LAB — TROPONIN I: Troponin I: <0.03 ng/mL (ref <0.03)

## 2016-05-26 IMAGING — CT CT ANGIO CHEST
2 of 6 series · 18 of 46 positions shown · IV contrast (APPLIED)
Comparison: CTA of the chest performed [DATE]

CLINICAL DATA: Acute onset of upper back pain. Pleuritic chest
pain. Known subacute pulmonary emboli. Further evaluation requested.
Initial encounter.

EXAM:
CT ANGIOGRAPHY CHEST WITH CONTRAST
TECHNIQUE: Multidetector CT imaging of the chest was performed using the
standard protocol during bolus administration of intravenous
contrast. Multiplanar CT image reconstructions and MIPs were
obtained to evaluate the vascular anatomy.
CONTRAST:  100 mL of Isovue 370 IV contrast

[Series 5: thins · axial · 0.68mm/px · z∈[-203,+32]mm · 16 of 259 slices shown]
[im 12/259  lung]
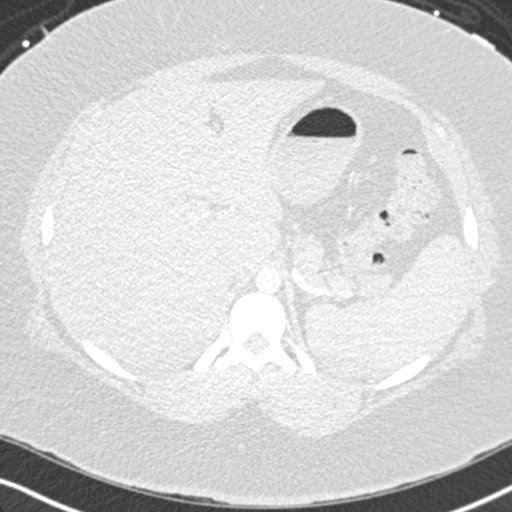
[im 34/259  soft-tissue]
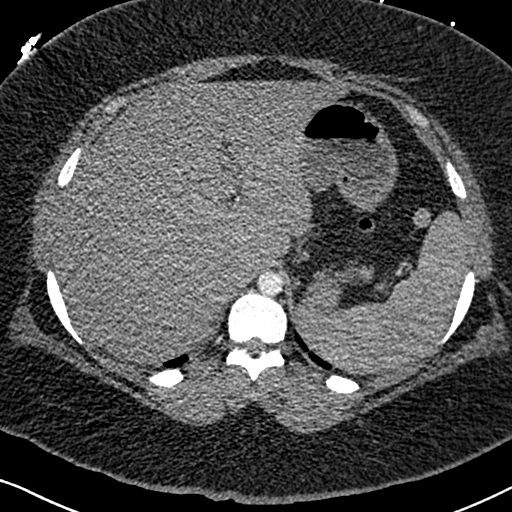
[im 45/259  lung]
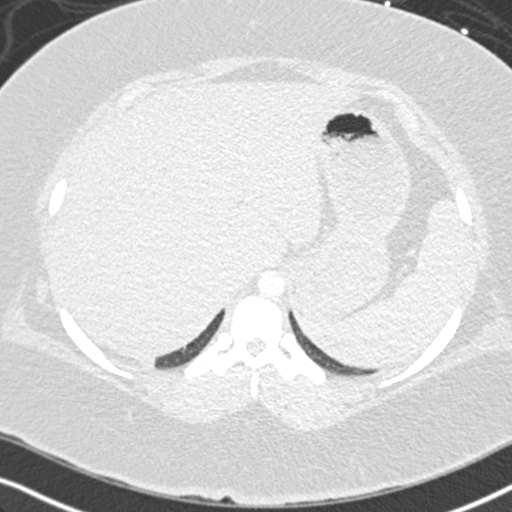
[im 57/259  soft-tissue]
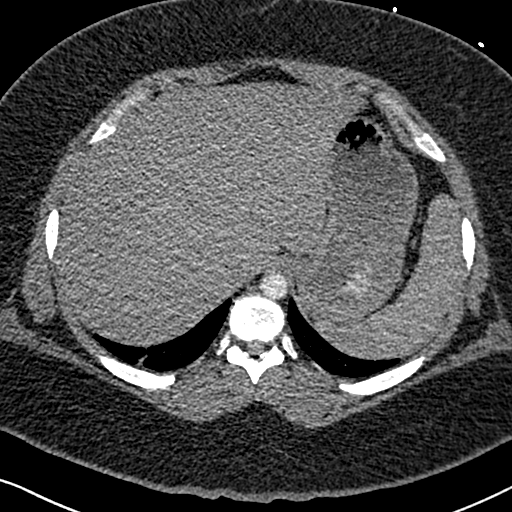
[im 79/259  lung]
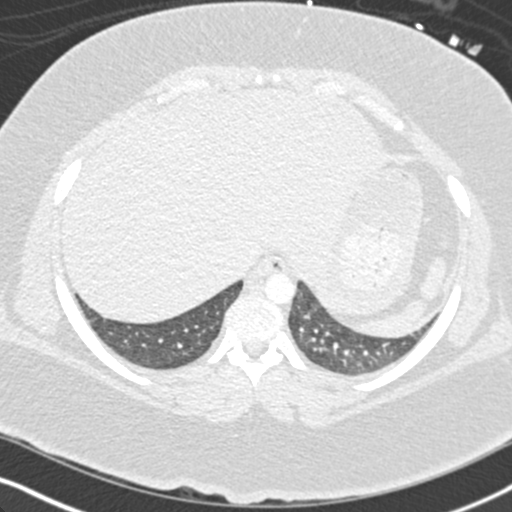
[im 90/259  soft-tissue]
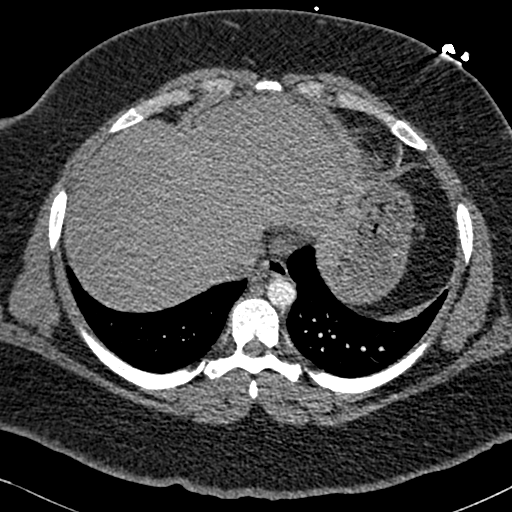
[im 101/259  lung]
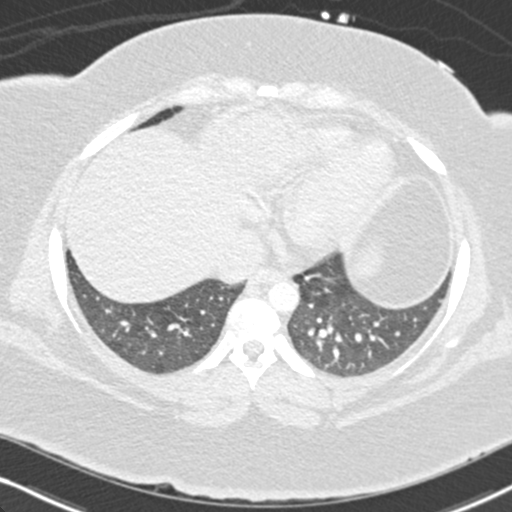
[im 124/259  soft-tissue]
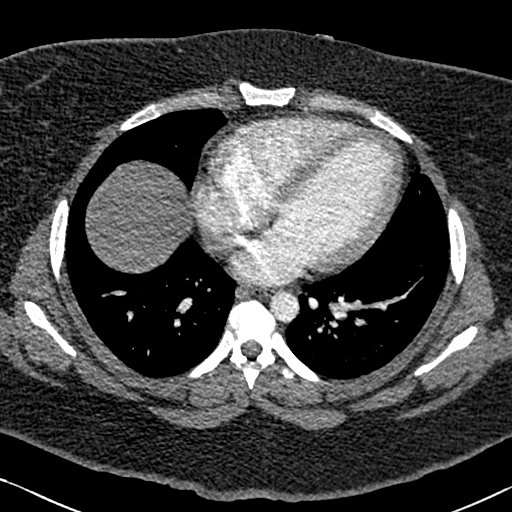
[im 135/259  lung]
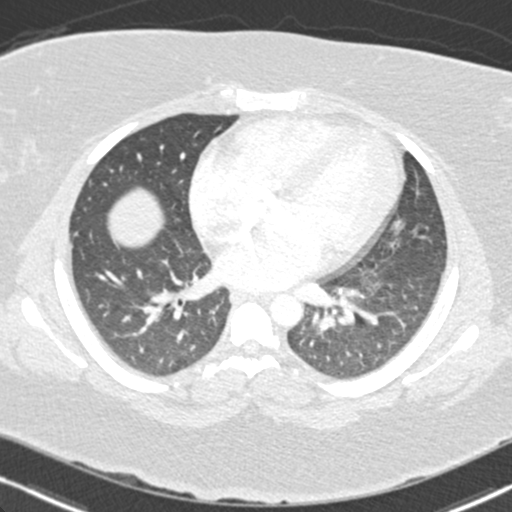
[im 158/259  soft-tissue]
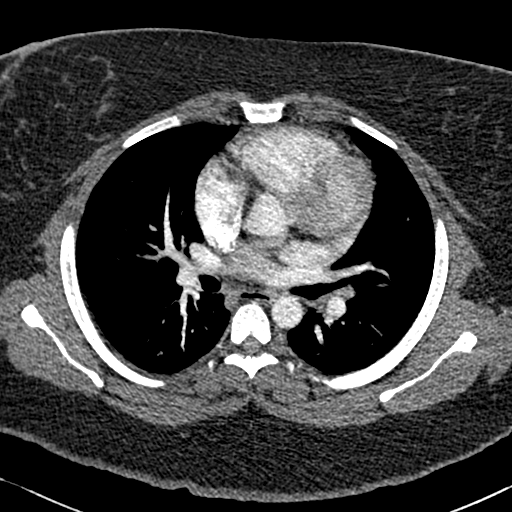
[im 169/259  lung]
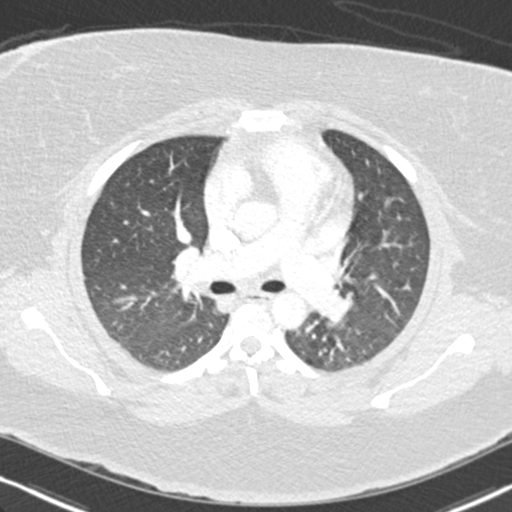
[im 180/259  soft-tissue]
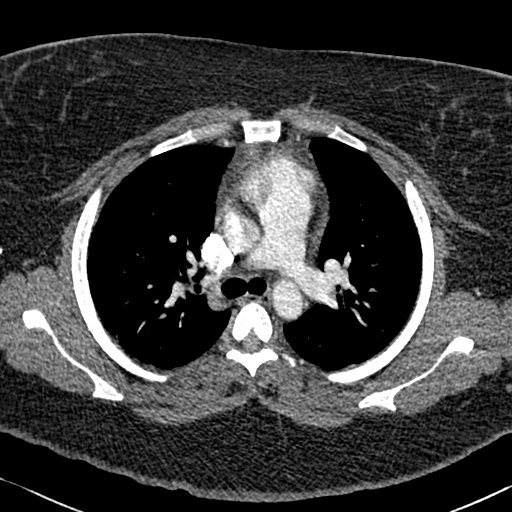
[im 202/259  lung]
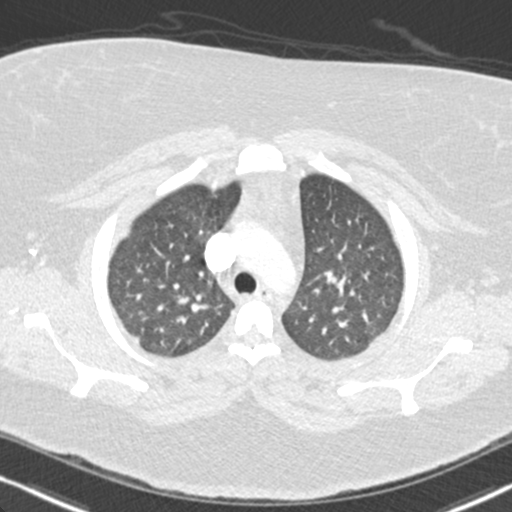
[im 214/259  soft-tissue]
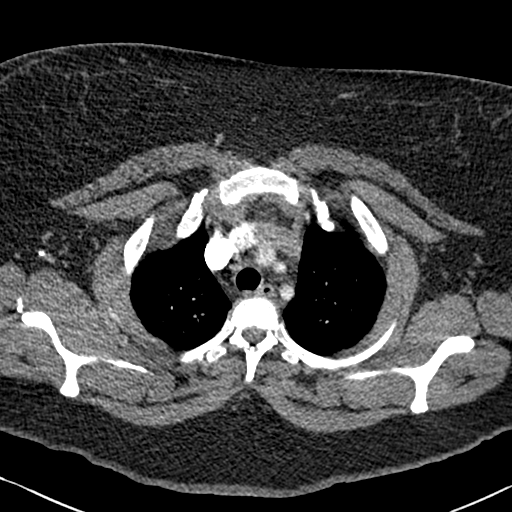
[im 225/259  lung]
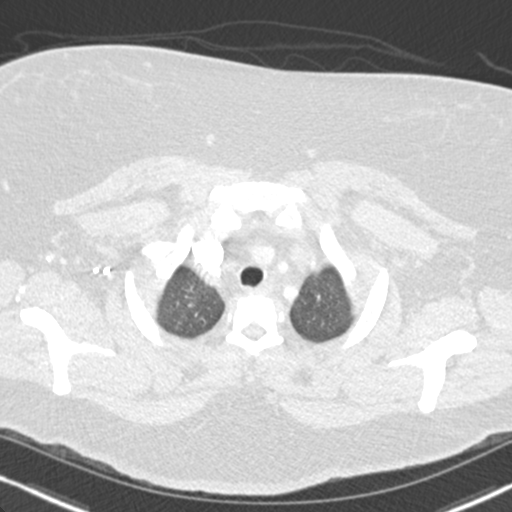
[im 247/259  soft-tissue]
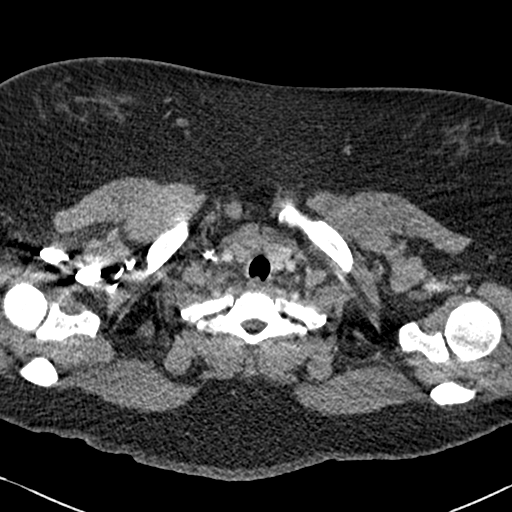

[Series 7: coronal mpr · coronal · 0.54mm/px · 2 of 90 slices shown]
[im 30/90  soft-tissue]
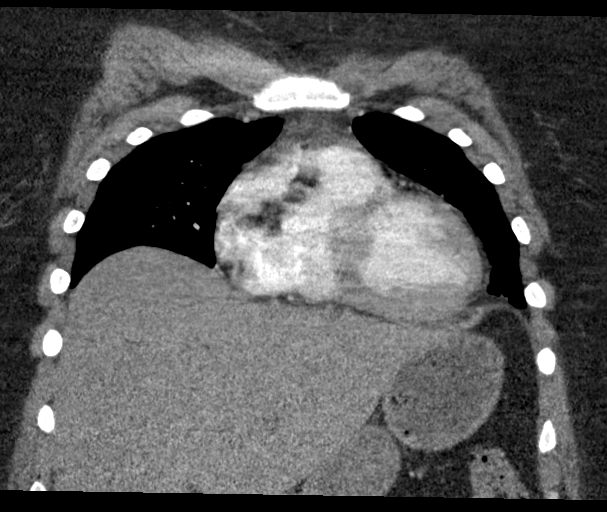
[im 60/90  soft-tissue]
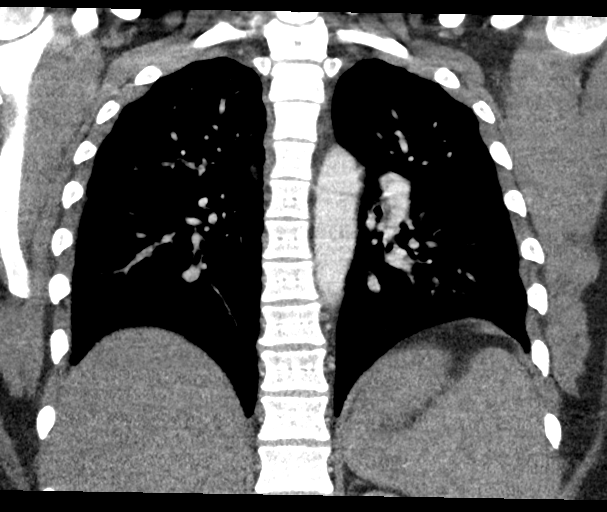

[18 of 46 positions shown; findings below may reference images not displayed]

FINDINGS: Cardiovascular: Residual small subacute pulmonary embolus is
suggested within a segmental branch to the right lower lobe. No new
pulmonary embolus is identified.

The heart is borderline enlarged. No calcific atherosclerotic
disease is seen. The great vessels are grossly unremarkable in
appearance.

Mediastinum/Nodes: The mediastinum is otherwise unremarkable. No
mediastinal lymphadenopathy is seen. No pericardial effusion is
identified. The visualized portions of the thyroid gland are
unremarkable. No axillary lymphadenopathy is seen.

Lungs/Pleura: A nodular opacity measuring 1.2 cm at the right lung
base may reflect residual pneumonia or sequelae of pulmonary
infarct, given its first appearance in [REDACTED] with otherwise
resolved airspace opacities.

No pleural effusion or pneumothorax is now seen.

Upper Abdomen: The visualized portions of the liver and spleen are
grossly unremarkable. The visualized portions of the pancreas are
within normal limits.

Musculoskeletal: No acute osseous abnormalities are identified. The
visualized musculature is unremarkable in appearance.

Review of the MIP images confirms the above findings.
IMPRESSION: 1. Residual small subacute pulmonary embolus suggested within a
segmental branch to the right lower lung lobe. No new pulmonary
embolus seen.
2. Borderline cardiomegaly.
3. Nodular opacity measuring 1.2 cm at the right lung base may
reflect residual pneumonia or sequelae of pulmonary infarct, given
that it first appeared in [REDACTED] with airspace opacities that have
since resolved.

## 2016-05-26 MED ORDER — IOPAMIDOL (ISOVUE-370) INJECTION 76%
100.0000 mL | Freq: Once | INTRAVENOUS | Status: AC | PRN
  Administered 2016-05-26: 100 mL via INTRAVENOUS

## 2016-05-26 NOTE — Discharge Instructions (Signed)

## 2016-05-26 NOTE — ED Provider Notes (Signed)
[facility] Emergency Department Provider Note  ____________________________________________  Time seen: Approximately 6:38 PM  I have reviewed the triage vital signs and the nursing notes.   HISTORY  Chief Complaint Back Pain   HPI Arkesha Raysor is a 31 y.o. 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 Patient has failed outpatient therapy on xarelto and coumadin and had IVC filter placed in December. She is currently on twice a day Lovenox shots. For the last 3 days she has had diffuse chest pain which is now radiating to her back. The pain is 6 out of 10, sharp, pleuritic in nature, constant She also has had swelling of her right lower extremity which has been present since she left the hospital in December. She endorses compliance with her medications. No hemoptysis, no flulike symptoms, no shortness of breath. She called her primary care doctor who told her to come to the emergency room for evaluation.  Past Medical History:  Diagnosis Date  . Abnormal vaginal bleeding   . Anxiety   . Depression   . Diabetes mellitus without complication ([place])   . DVT (deep venous thrombosis) ([place])   . Fibroid   . Hypertension   . Obesity   . Pulmonary emboli ([place])   . Sinus congestion     Patient Active Problem List   Diagnosis Date Noted  . DVT (deep vein thrombosis) in pregnancy ([place]) 03/26/2016  . Hypokalemia 03/26/2016  . Anemia 03/26/2016  . Morbid obesity ([place]) 03/26/2016  . Pulmonary embolism ([place]) 03/24/2016  . Pseudotumor cerebri 03/19/2016  . Pleuritic chest pain 03/18/2016  . Chest pain 03/18/2016  . Right pulmonary embolus ([place]) 03/17/2016  . PE (pulmonary thromboembolism) ([place]) 03/12/2016  . DVT (deep venous thrombosis) ([place]) 03/12/2016  . HTN (hypertension) 03/12/2016  . Depression 03/12/2016  . Anxiety 03/12/2016  . GERD (gastroesophageal reflux  disease) 03/12/2016  . Hyperglycemia 03/12/2016    Past Surgical History:  Procedure Laterality Date  . CHOLECYSTECTOMY    . PERIPHERAL VASCULAR CATHETERIZATION N/A 03/25/2016   Procedure: IVC Filter Insertion;  Surgeon: Katha Cabal, MD;  Location: [facility];  Service: Cardiovascular;  Laterality: N/A;  . WISDOM TOOTH EXTRACTION      Prior to Admission medications   Medication Sig Start Date End Date Taking? Authorizing Provider  acetaZOLAMIDE (DIAMOX) 250 MG tablet Take 500 mg by mouth 2 (two) times daily.   Yes Historical Provider, MD  cetirizine (ZYRTEC) 10 MG tablet Take 10 mg by mouth daily.   Yes Historical Provider, MD  enoxaparin (LOVENOX) 80 MG/0.8ML injection Inject 1.6 mLs (160 mg total) into the skin every 12 (twelve) hours. 03/26/16  Yes Theodoro Grist, MD  ferrous sulfate 325 (65 FE) MG tablet Take 325 mg by mouth 3 (three) times daily with meals.   Yes Historical Provider, MD  folic acid (FOLVITE) 1 MG tablet Take 1 tablet (1 mg total) by mouth daily. 03/20/16  Yes Lavina Hamman, MD  gabapentin (NEURONTIN) 300 MG capsule Take 300 mg by mouth 3 (three) times daily.   Yes Historical Provider, MD  metFORMIN (GLUCOPHAGE-XR) 500 MG 24 hr tablet Take 1,000 mg by mouth daily with breakfast.   Yes Historical Provider, MD  omeprazole (PRILOSEC) 20 MG capsule Take 20 mg by mouth daily.   Yes Historical Provider, MD  oxyCODONE (OXY IR/ROXICODONE) 5 MG immediate release tablet Take 1  tablet (5 mg total) by mouth every 4 (four) hours as needed for moderate pain, severe pain or breakthrough pain. 03/26/16  Yes Theodoro Grist, MD  polyethylene glycol (MIRALAX / GLYCOLAX) packet Take 17 g by mouth daily. 03/20/16   Lavina Hamman, MD    Allergies Lisinopril  Family History  Problem Relation Age of Onset  . Cancer Paternal Grandfather   . Rheumatologic disease Neg Hx     Social History Social History  Substance Use Topics  . Smoking status: Never Smoker  . Smokeless  tobacco: Never Used  . Alcohol use Yes     Comment: occas. social    Review of Systems  Constitutional: Negative for fever. Eyes: Negative for visual changes. ENT: Negative for sore throat. Neck: No neck pain  Cardiovascular: + chest pain. Respiratory: Negative for shortness of breath. Gastrointestinal: Negative for abdominal pain, vomiting or diarrhea. Genitourinary: Negative for dysuria. Musculoskeletal: + upper back pain. Skin: Negative for rash. Neurological: Negative for headaches, weakness or numbness. Psych: No SI or HI  ____________________________________________   PHYSICAL EXAM:  VITAL SIGNS: ED Triage Vitals [05/26/16 1806]  Enc Vitals Group     BP (!) 142/97     Pulse Rate 100     Resp 18     Temp 98.9 F (37.2 C)     Temp Source Oral     SpO2 96 %     Weight (!) 362 lb (164.2 kg)     Height 5\' 9"  (1.753 m)     Head Circumference      Peak Flow      Pain Score 6     Pain Loc      Pain Edu?      Excl. in [place]?     Constitutional: Alert and oriented. Well appearing and in no apparent distress. HEENT:      Head: Normocephalic and atraumatic.         Eyes: Conjunctivae are normal. Sclera is non-icteric. EOMI. PERRL      Mouth/Throat: Mucous membranes are moist.       Neck: Supple with no signs of meningismus. Cardiovascular: Regular rate and rhythm. No murmurs, gallops, or rubs. 2+ symmetrical distal pulses are present in all extremities. No JVD. Respiratory: Normal respiratory effort. Lungs are clear to auscultation bilaterally. No wheezes, crackles, or rhonchi.  Gastrointestinal: Soft, non tender, and non distended with positive bowel sounds. No rebound or guarding. Genitourinary: No CVA tenderness. Musculoskeletal: Nontender with normal range of motion in all extremities. No edema, cyanosis, or erythema of extremities. Neurologic: Normal speech and language. Face is symmetric. Moving all extremities. No gross focal neurologic deficits are  appreciated. Skin: Skin is warm, dry and intact. No rash noted. Psychiatric: Mood and affect are normal. Speech and behavior are normal.  ____________________________________________   LABS (all labs ordered are listed, but only abnormal results are displayed)  Labs Reviewed  CBC WITH DIFFERENTIAL/PLATELET - Abnormal; Notable for the following:       Result Value   Hemoglobin 11.6 (*)    MCV 75.0 (*)    MCH 23.8 (*)    MCHC 31.8 (*)    RDW 19.3 (*)    All other components within normal limits  BASIC METABOLIC PANEL - Abnormal; Notable for the following:    Potassium 3.2 (*)    Glucose, Bld 118 (*)    All other components within normal limits  TROPONIN I  BRAIN NATRIURETIC PEPTIDE   ____________________________________________  EKG  ED ECG  REPORT I, Rudene Re, the attending physician, personally viewed and interpreted this ECG.  Normal sinus rhythm, rate of 94, normal intervals, normal axis, no ST elevations or depressions, ____________________________________________  RADIOLOGY  CTA: 1. Residual small subacute pulmonary embolus suggested within a segmental branch to the right lower lung lobe. No new pulmonary embolus seen. 2. Borderline cardiomegaly. 3. Nodular opacity measuring 1.2 cm at the right lung base may reflect residual pneumonia or sequelae of pulmonary infarct, given that it first appeared in December with airspace opacities that have since resolved.  ____________________________________________   PROCEDURES  Procedure(s) performed: None Procedures Critical Care performed:  None ____________________________________________   INITIAL IMPRESSION / ASSESSMENT AND PLAN / ED COURSE  31 y.o. 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 Patient has normal vital signs, she has an IVC filter and endorses compliance with her Lovenox however  patient has failed outpatient therapy with both Xarelto and Coumadin in the past. Will get CBC, BMP, BNP, troponin, EKG and a CT of the chest.  Clinical Course as of May 26 2026  Tue May 26, 2016  2023 I spoke with the Radiologist Dr. Radene Knee who told me no new PEs since last CT from December. CT with no other acute findings. Troponin, EKG, BNP all negative. Will discharge home.  [CV]    Clinical Course User Index [CV] Rudene Re, MD    Pertinent labs & imaging results that were available during my care of the patient were reviewed by me and considered in my medical decision making (see chart for details).    ____________________________________________   FINAL CLINICAL IMPRESSION(S) / ED DIAGNOSES  Final diagnoses:  Chest pain, unspecified type      NEW MEDICATIONS STARTED DURING THIS VISIT:  New Prescriptions   No medications on file     Note:  This document was prepared using Dragon voice recognition software and may include unintentional dictation errors.    Rudene Re, MD 05/26/16 2030

## 2016-05-26 NOTE — ED Triage Notes (Signed)
Pt with known PE and is having upper back pain. Pt was sent over for further eval, Dr Doreene Nest.

## 2016-05-26 NOTE — ED Notes (Signed)
URINE PREG NEGATIVE 

## 2016-06-05 IMAGING — US US EXTREM LOW VENOUS*R*
1 series · 13 of 24 positions shown · non-contrast
Comparison: None.

CLINICAL DATA: Acute right calf pain for the past 12 hours.
Evaluate for DVT.



[Series 1: us extrem low venous*right* · 0.09mm/px · 13 of 35 slices shown]
[im 1/35]
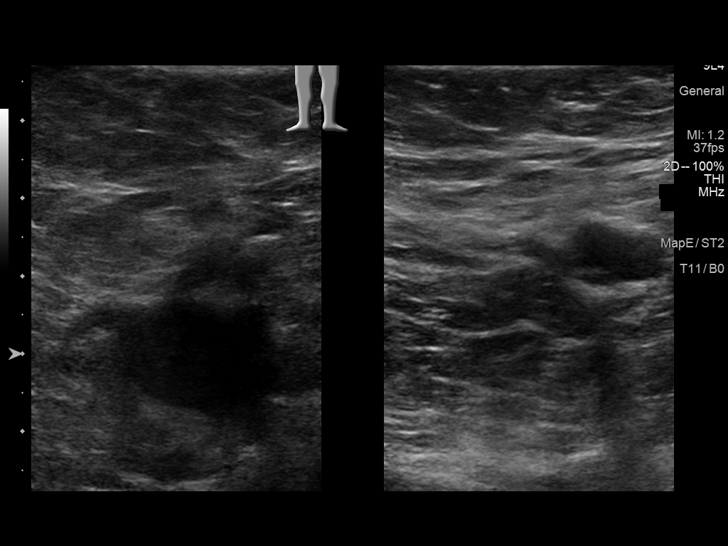
[im 3/35]
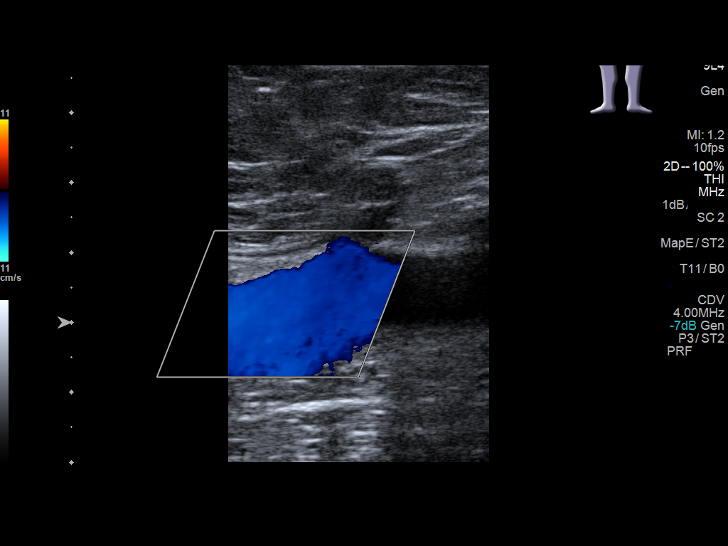
[im 6/35]
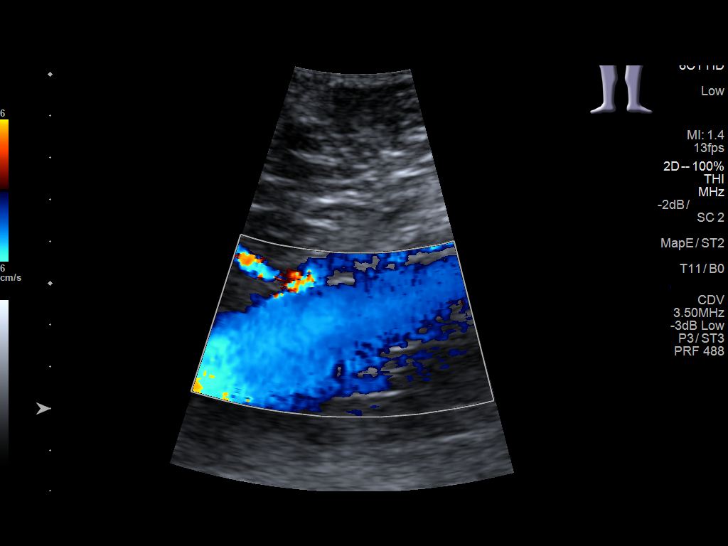
[im 9/35]
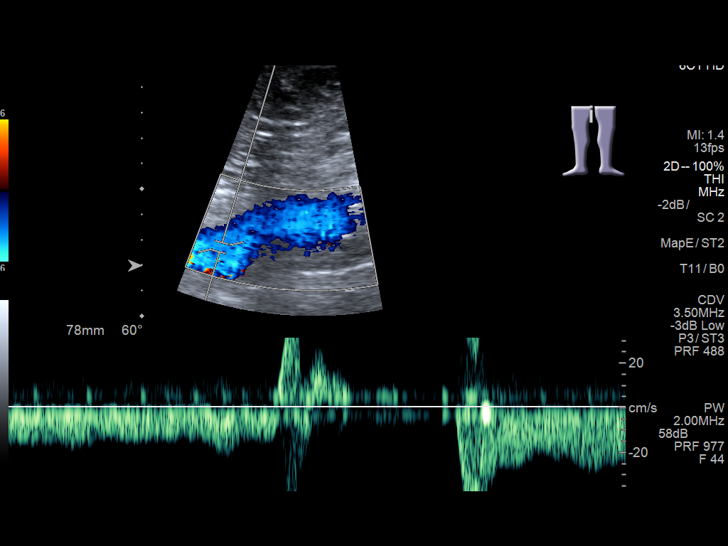
[im 12/35]
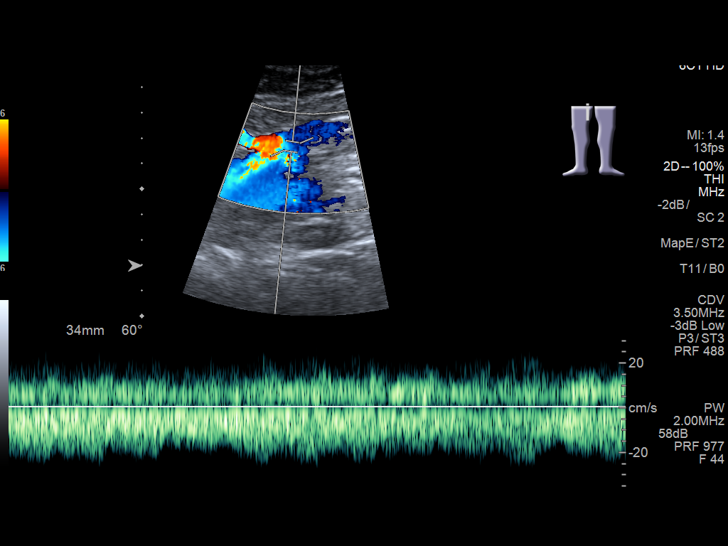
[im 15/35]
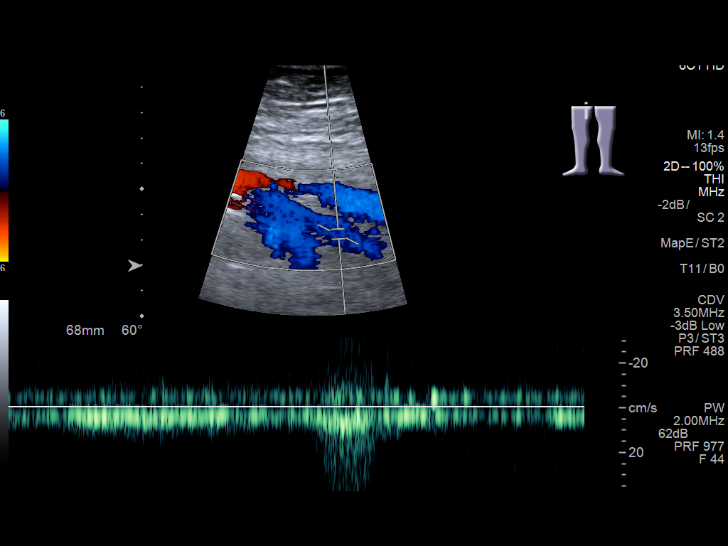
[im 18/35]
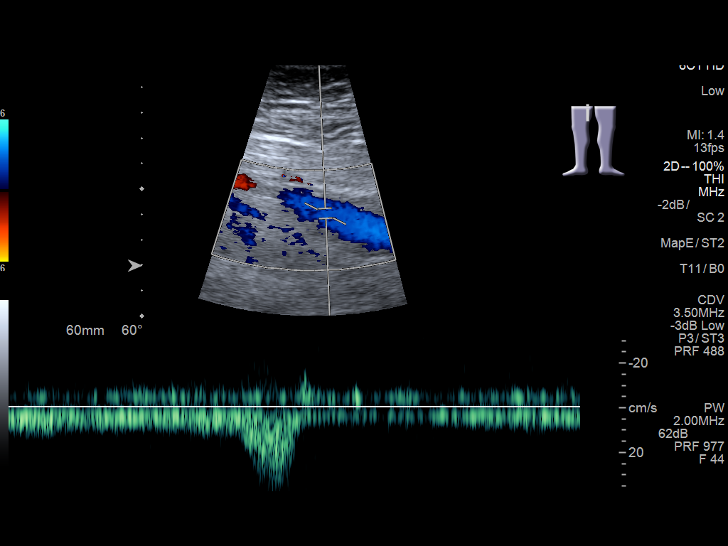
[im 20/35]
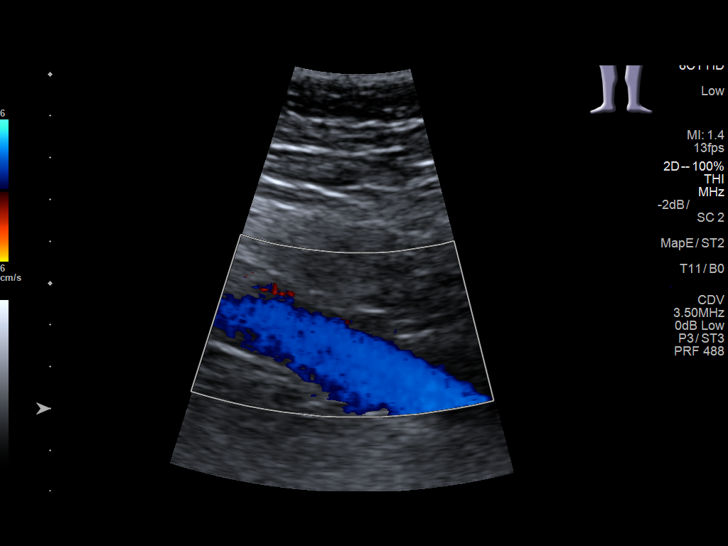
[im 23/35]
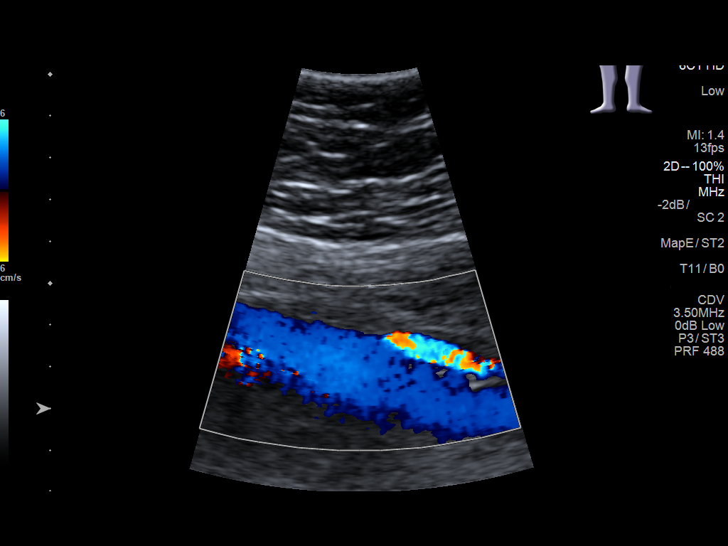
[im 26/35]
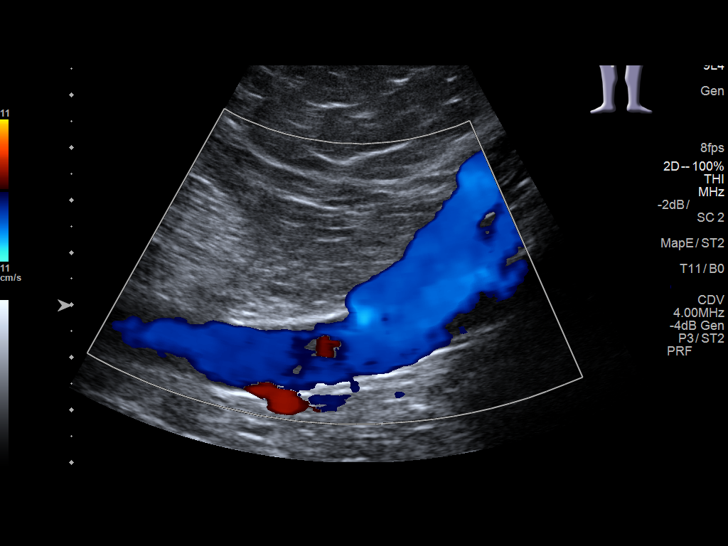
[im 29/35]
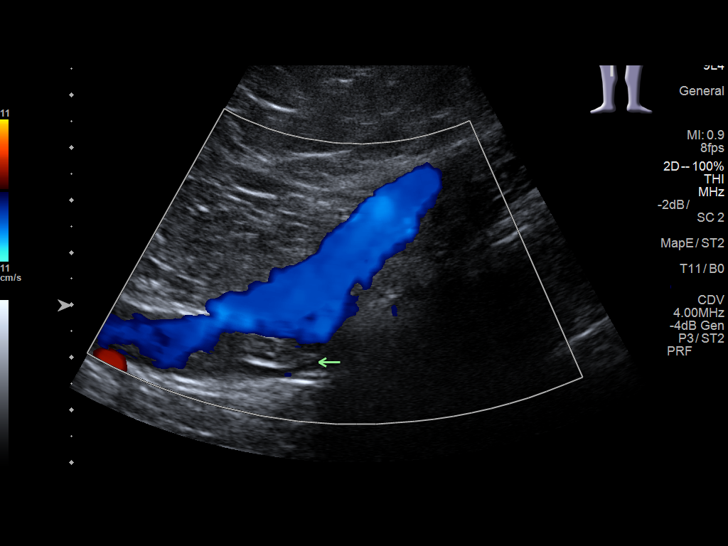
[im 32/35]
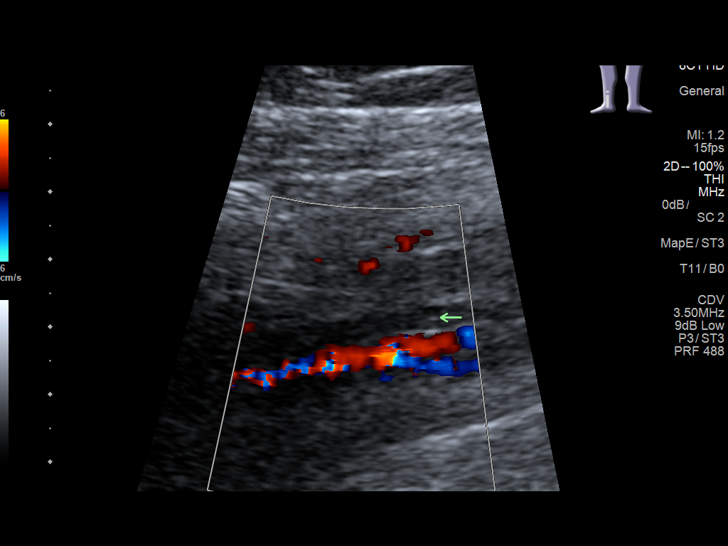
[im 35/35]
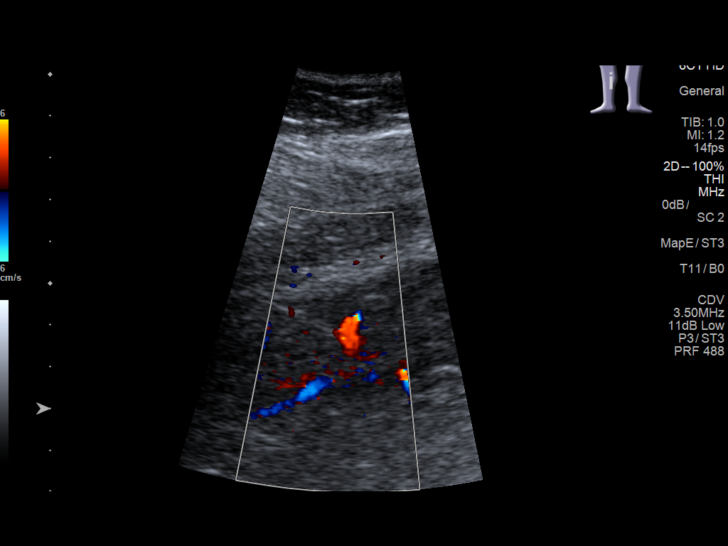

[13 of 24 positions shown; findings below may reference images not displayed]

FINDINGS: Examination is degraded due to patient body habitus and poor
sonographic window.

Contralateral Common Femoral Vein: Respiratory phasicity is normal
and symmetric with the symptomatic side. No evidence of thrombus.
Normal compressibility.

Common Femoral Vein: No evidence of thrombus. Normal
compressibility, respiratory phasicity and response to augmentation.

Saphenofemoral Junction: No evidence of thrombus. Normal
compressibility and flow on color Doppler imaging.

Profunda Femoral Vein: No evidence of thrombus. Normal
compressibility and flow on color Doppler imaging.

Femoral Vein: No evidence of thrombus. Normal compressibility,
respiratory phasicity and response to augmentation.

Popliteal Vein: There is a mixed echogenic occlusive thrombus within
the distal aspect of the right popliteal vein, distal to the
confluence of the lesser saphenous vein (image 31).

Calf Veins: There is echogenic occlusive thrombus within the right
posterior tibial vein (representative images 34 and 35). The
peroneal vein is suboptimally imaged.

Superficial Great Saphenous Vein: No evidence of thrombus. Normal
compressibility and flow on color Doppler imaging.

Venous Reflux:  None.

Other Findings:  None.
IMPRESSION: The examination is positive for age-indeterminate (potentially
chronic) occlusive DVT within distal aspect of the popliteal vein
extending to involve (at least) the right posterior tibial vein.

## 2016-11-23 ENCOUNTER — Encounter (INDEPENDENT_AMBULATORY_CARE_PROVIDER_SITE_OTHER): Payer: BC Managed Care – PPO | Admitting: Vascular Surgery

## 2017-03-05 ENCOUNTER — Encounter: Payer: Self-pay | Admitting: Emergency Medicine

## 2017-03-05 ENCOUNTER — Emergency Department: Payer: BC Managed Care – PPO

## 2017-03-05 ENCOUNTER — Emergency Department
Admission: EM | Admit: 2017-03-05 | Discharge: 2017-03-06 | Disposition: A | Discharge status: Home / Self Care | Payer: BC Managed Care – PPO | Attending: Emergency Medicine | Admitting: Emergency Medicine

## 2017-03-05 DIAGNOSIS — I10 Essential (primary) hypertension: Secondary | ICD-10-CM | POA: Diagnosis not present

## 2017-03-05 DIAGNOSIS — E876 Hypokalemia: Secondary | ICD-10-CM | POA: Diagnosis not present

## 2017-03-05 DIAGNOSIS — R079 Chest pain, unspecified: Secondary | ICD-10-CM | POA: Diagnosis not present

## 2017-03-05 DIAGNOSIS — E119 Type 2 diabetes mellitus without complications: Secondary | ICD-10-CM | POA: Diagnosis not present

## 2017-03-05 DIAGNOSIS — Z7984 Long term (current) use of oral hypoglycemic drugs: Secondary | ICD-10-CM | POA: Insufficient documentation

## 2017-03-05 DIAGNOSIS — Z79899 Other long term (current) drug therapy: Secondary | ICD-10-CM | POA: Diagnosis not present

## 2017-03-05 DIAGNOSIS — R0602 Shortness of breath: Secondary | ICD-10-CM | POA: Diagnosis present

## 2017-03-05 LAB — BASIC METABOLIC PANEL
ANION GAP: 14 (ref 5–15)
BUN: 11 mg/dL (ref 6–20)
CALCIUM: 9.2 mg/dL (ref 8.9–10.3)
CO2: 19 mmol/L — ABNORMAL LOW (ref 22–32)
CREATININE: 0.72 mg/dL (ref 0.44–1.00)
Chloride: 103 mmol/L (ref 101–111)
GLUCOSE: 141 mg/dL — AB (ref 65–99)
Potassium: 2.8 mmol/L — ABNORMAL LOW (ref 3.5–5.1)
Sodium: 136 mmol/L (ref 135–145)

## 2017-03-05 LAB — CBC
HCT: 39.2 % (ref 35.0–47.0)
HEMOGLOBIN: 12.8 g/dL (ref 12.0–16.0)
MCH: 26 pg (ref 26.0–34.0)
MCHC: 32.8 g/dL (ref 32.0–36.0)
MCV: 79.4 fL — ABNORMAL LOW (ref 80.0–100.0)
PLATELETS: 340 10*3/uL (ref 150–440)
RBC: 4.93 MIL/uL (ref 3.80–5.20)
RDW: 14.7 % — ABNORMAL HIGH (ref 11.5–14.5)
WBC: 9.7 10*3/uL (ref 3.6–11.0)

## 2017-03-05 LAB — TROPONIN I

## 2017-03-05 IMAGING — CT CT ANGIO CHEST
2 of 6 series · 17 of 46 positions shown · IV contrast (APPLIED)
Comparison: Radiograph earlier this day. Chest CTA [DATE],
additional priors

CLINICAL DATA: Shortness of breath.  History of pulmonary embolus.

EXAM:
CT ANGIOGRAPHY CHEST WITH CONTRAST
TECHNIQUE: Multidetector CT imaging of the chest was performed using the
standard protocol during bolus administration of intravenous
contrast. Multiplanar CT image reconstructions and MIPs were
obtained to evaluate the vascular anatomy.
CONTRAST:  100mL [78] IOPAMIDOL ([78]) INJECTION 76%

[Series 5: thins · axial · 0.86mm/px · z∈[+667,+897]mm · 14 of 252 slices shown]
[im 11/252  lung]
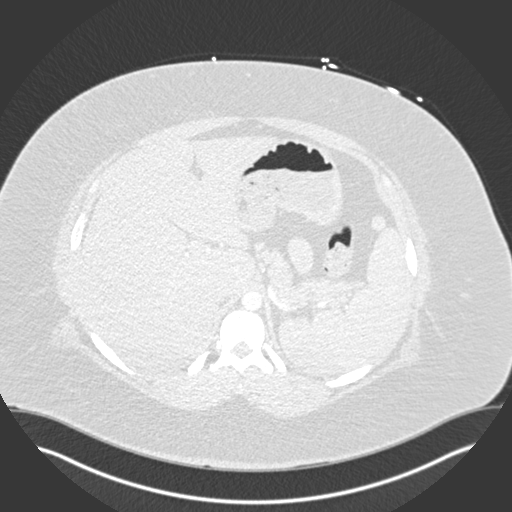
[im 33/252  soft-tissue]
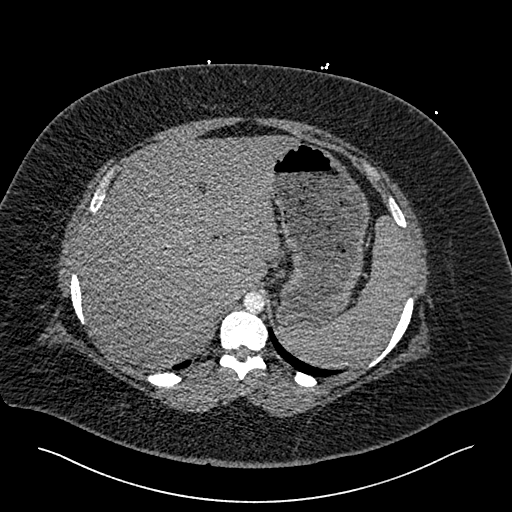
[im 44/252  lung]
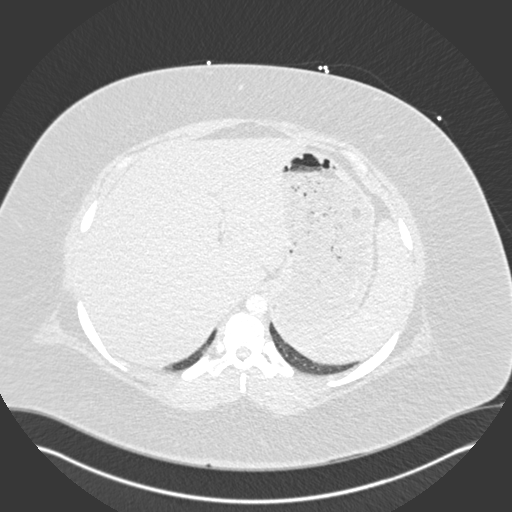
[im 66/252  soft-tissue]
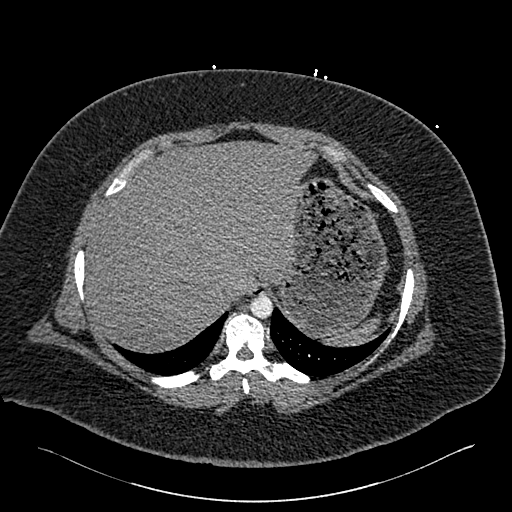
[im 88/252  lung]
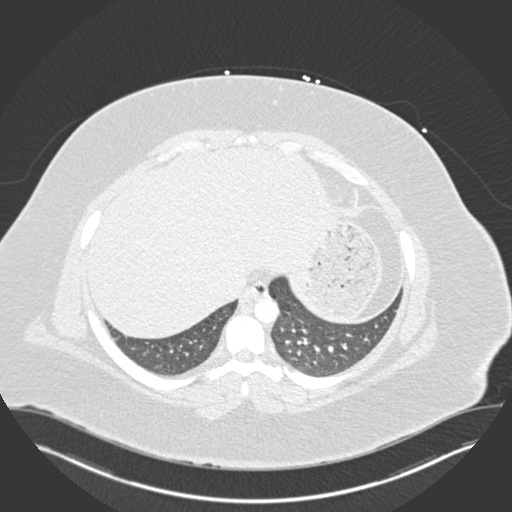
[im 99/252  soft-tissue]
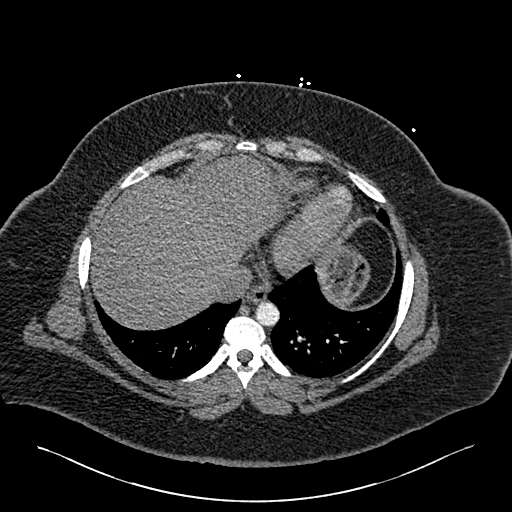
[im 121/252  lung]
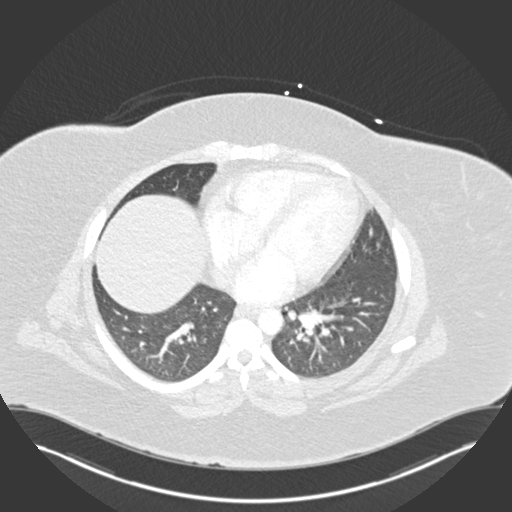
[im 131/252  soft-tissue]
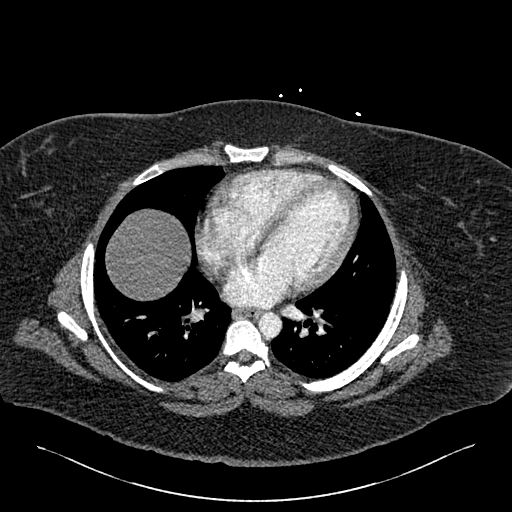
[im 153/252  lung]
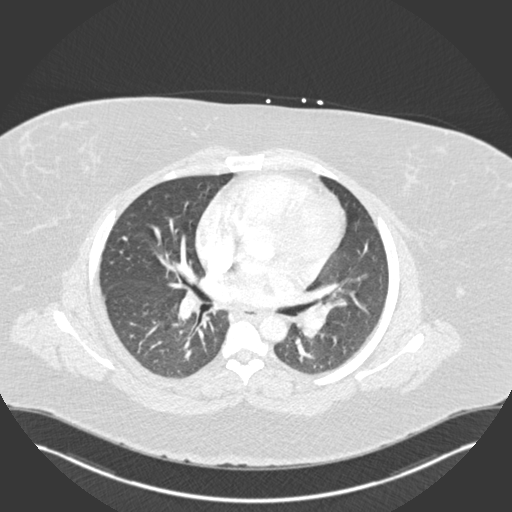
[im 164/252  soft-tissue]
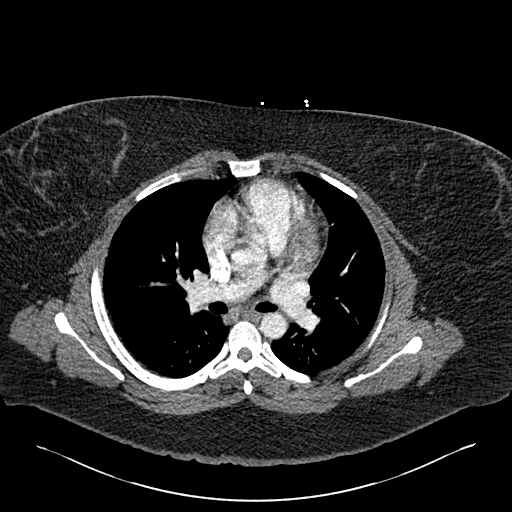
[im 186/252  lung]
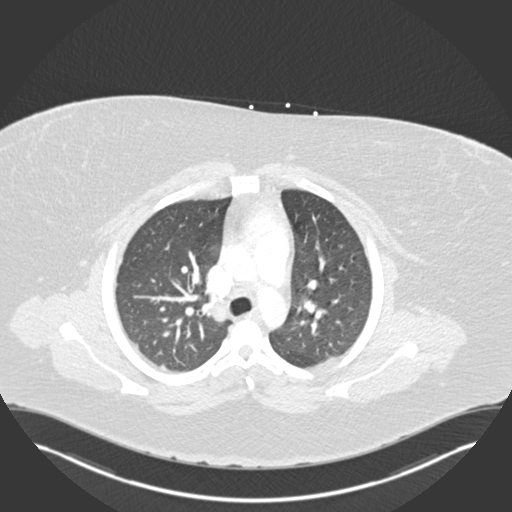
[im 208/252  soft-tissue]
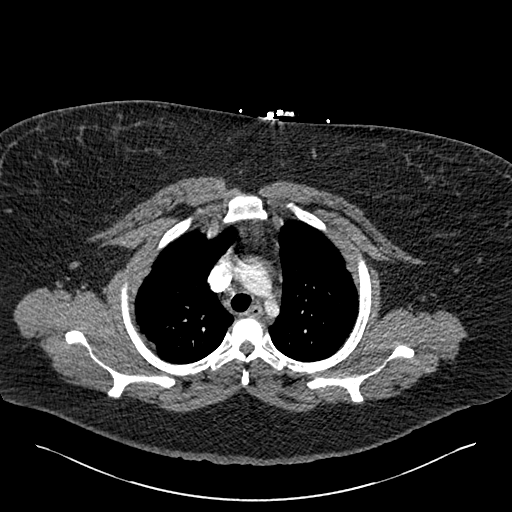
[im 219/252  lung]
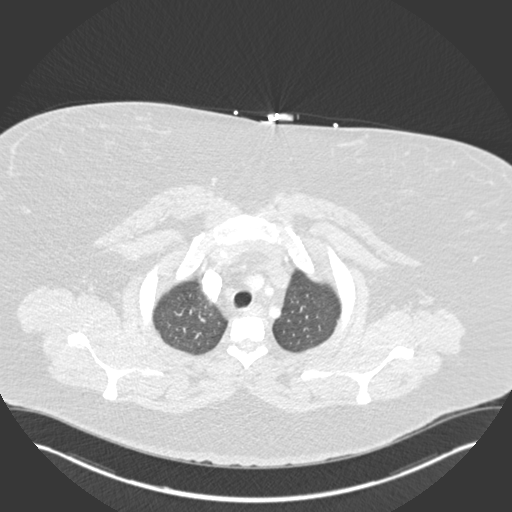
[im 241/252  soft-tissue]
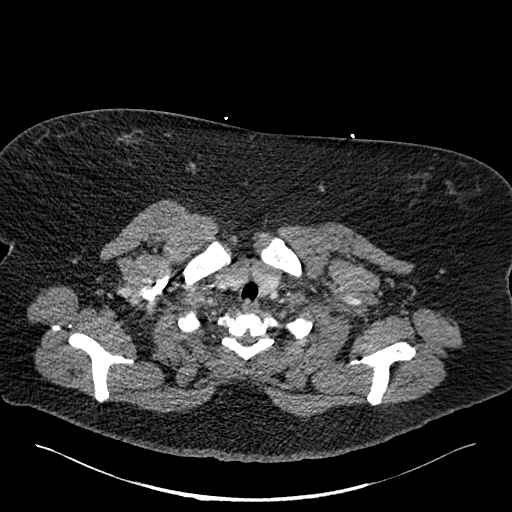

[Series 7: coronal mpr · coronal · 0.49mm/px · 3 of 98 slices shown]
[im 25/98  soft-tissue]
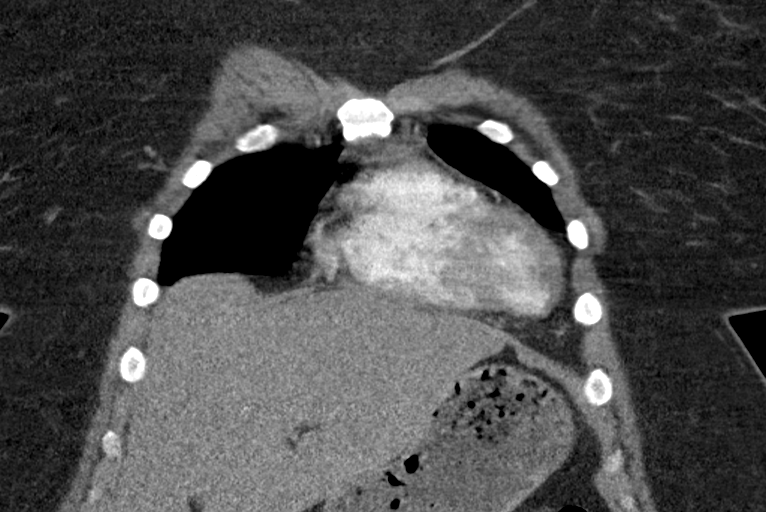
[im 49/98  soft-tissue]
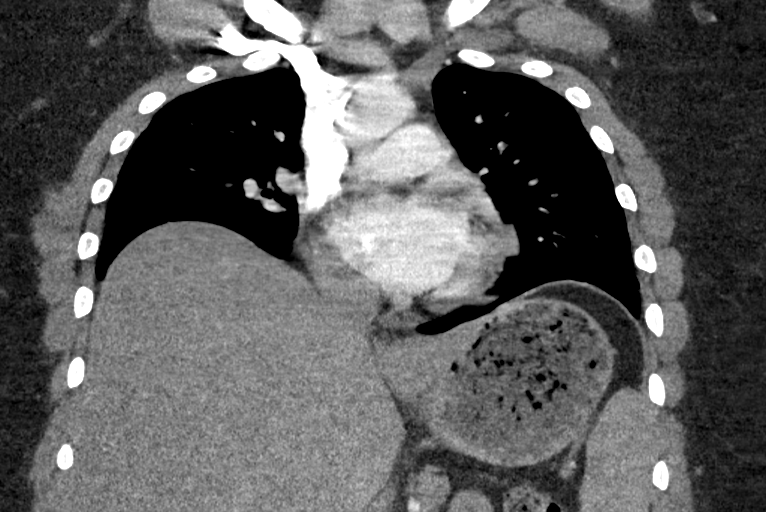
[im 73/98  soft-tissue]
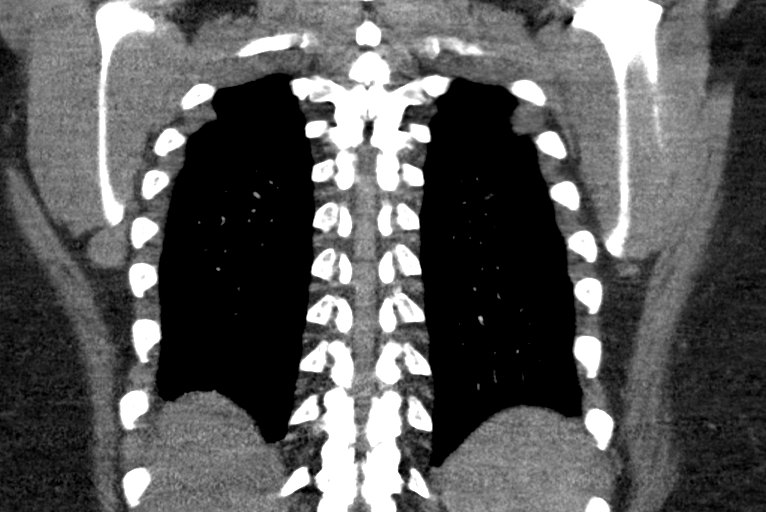

[17 of 46 positions shown; findings below may reference images not displayed]

FINDINGS: Cardiovascular: Technically limited study due to contrast bolus
timing and soft tissue attenuation from habitus. No central
pulmonary embolus to the lobar level. Previous chronic segmental
emboli are not well assessed, however no evidence of progression.
The heart is normal in size. Thoracic aorta is normal in caliber
without dissection.

Mediastinum/Nodes: No mediastinal or hilar adenopathy. A 15 mm left
breast nodule is the appearance of a lymph node, unchanged from
prior. The esophagus is decompressed. Visualized thyroid gland is
normal.

Lungs/Pleura: Decreased right basilar nodular density, currently 8
mm, previously 12 mm, may be sequela of remote prior infarct. No
consolidation or pulmonary edema. No pleural fluid.

Upper Abdomen: Prominent size liver and spleen, partially included.
No acute abnormality.

Musculoskeletal: There are no acute or suspicious osseous
abnormalities.

Review of the MIP images confirms the above findings.
IMPRESSION: 1. Technically limited exam showing no central pulmonary embolus.
2. No acute intrathoracic abnormality.

## 2017-03-05 IMAGING — CR DG CHEST 2V
1 series · 2 of 2 positions shown · non-contrast
Comparison: Chest [DATE]

CLINICAL DATA: Shortness of breath and left-sided chest discomfort.
Previous history of pulmonary embolus on Lovenox.

EXAM:
CHEST  2 VIEW

[Series 1: dg chest 2 view · 0.14mm/px · 2 of 2 slices shown]
[im 1/2]
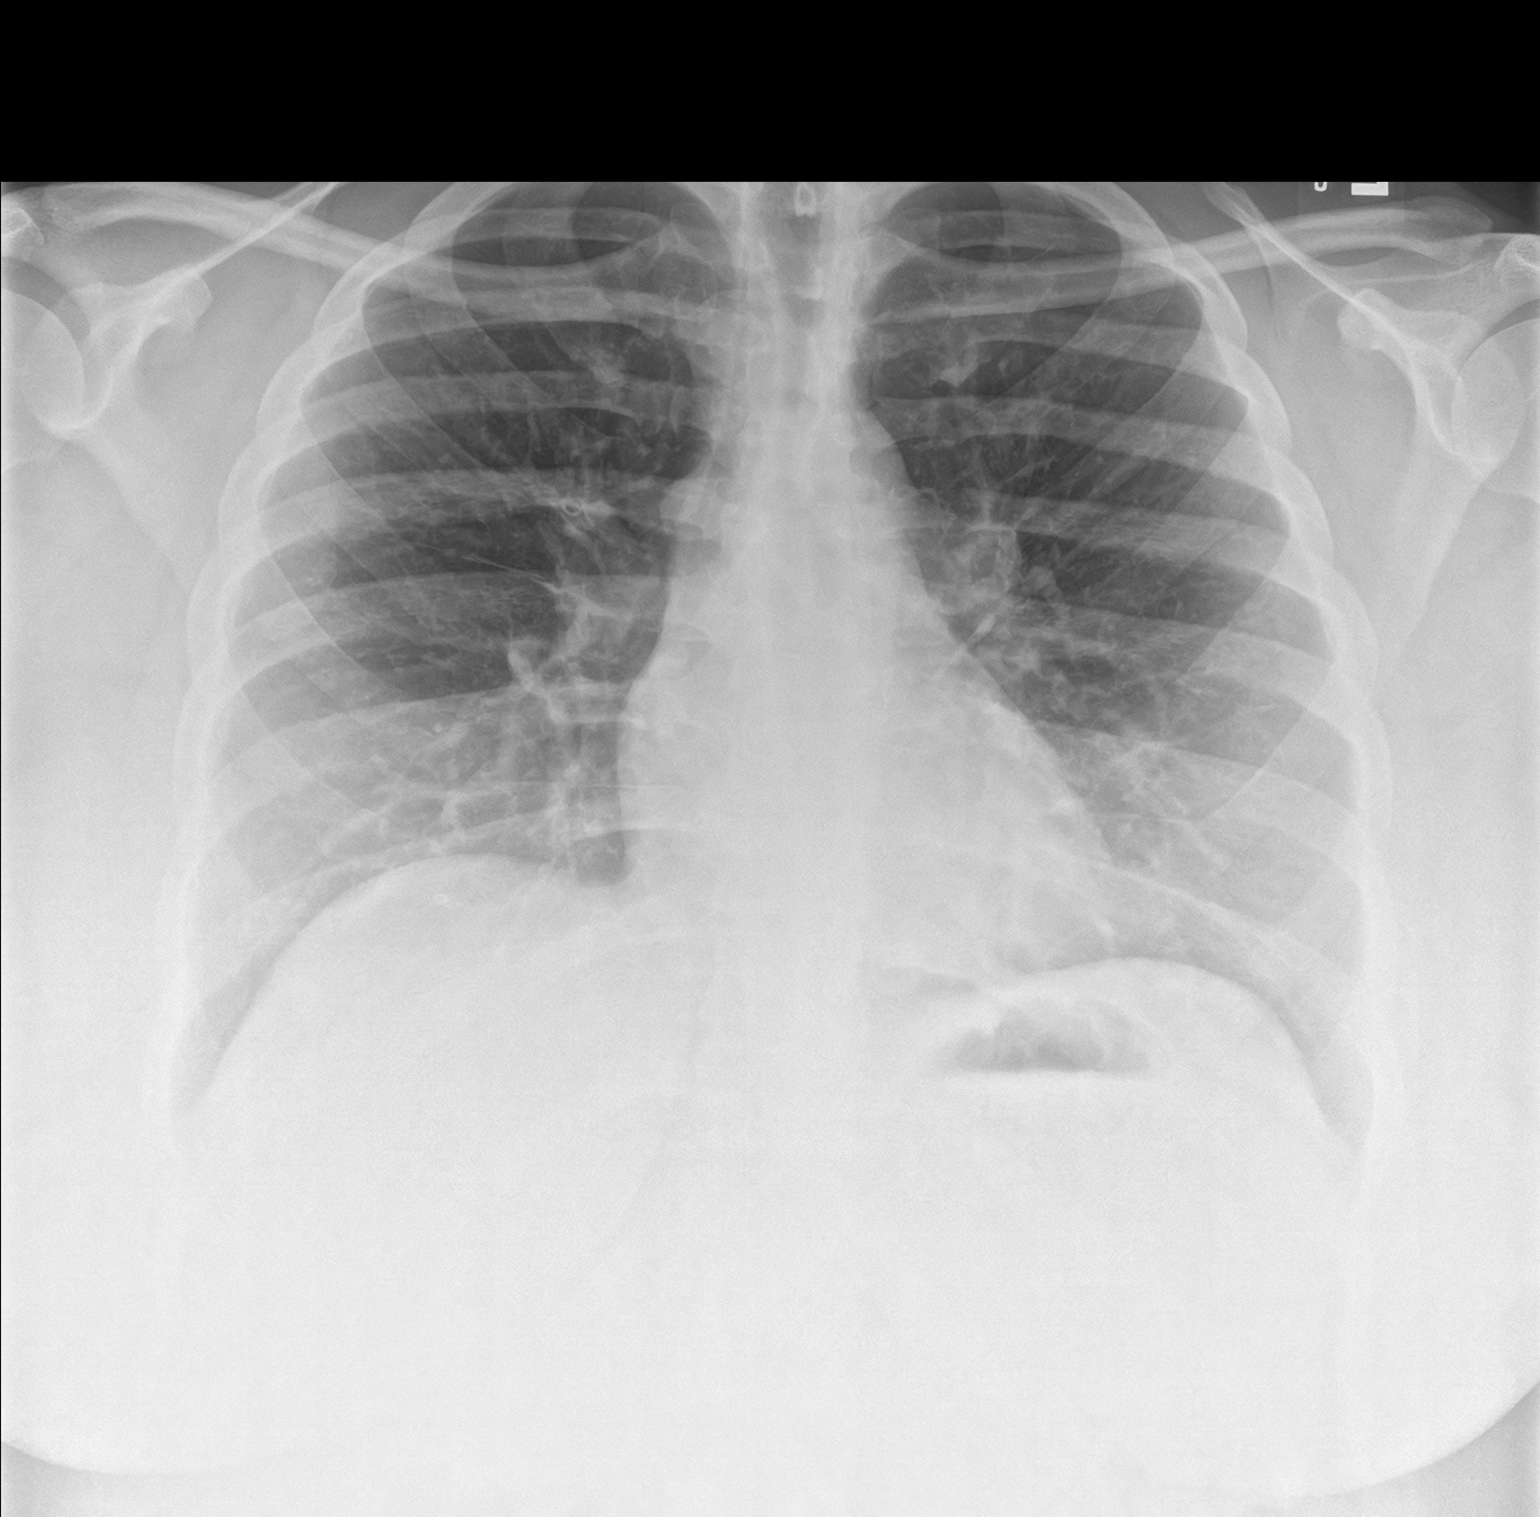
[im 2/2]
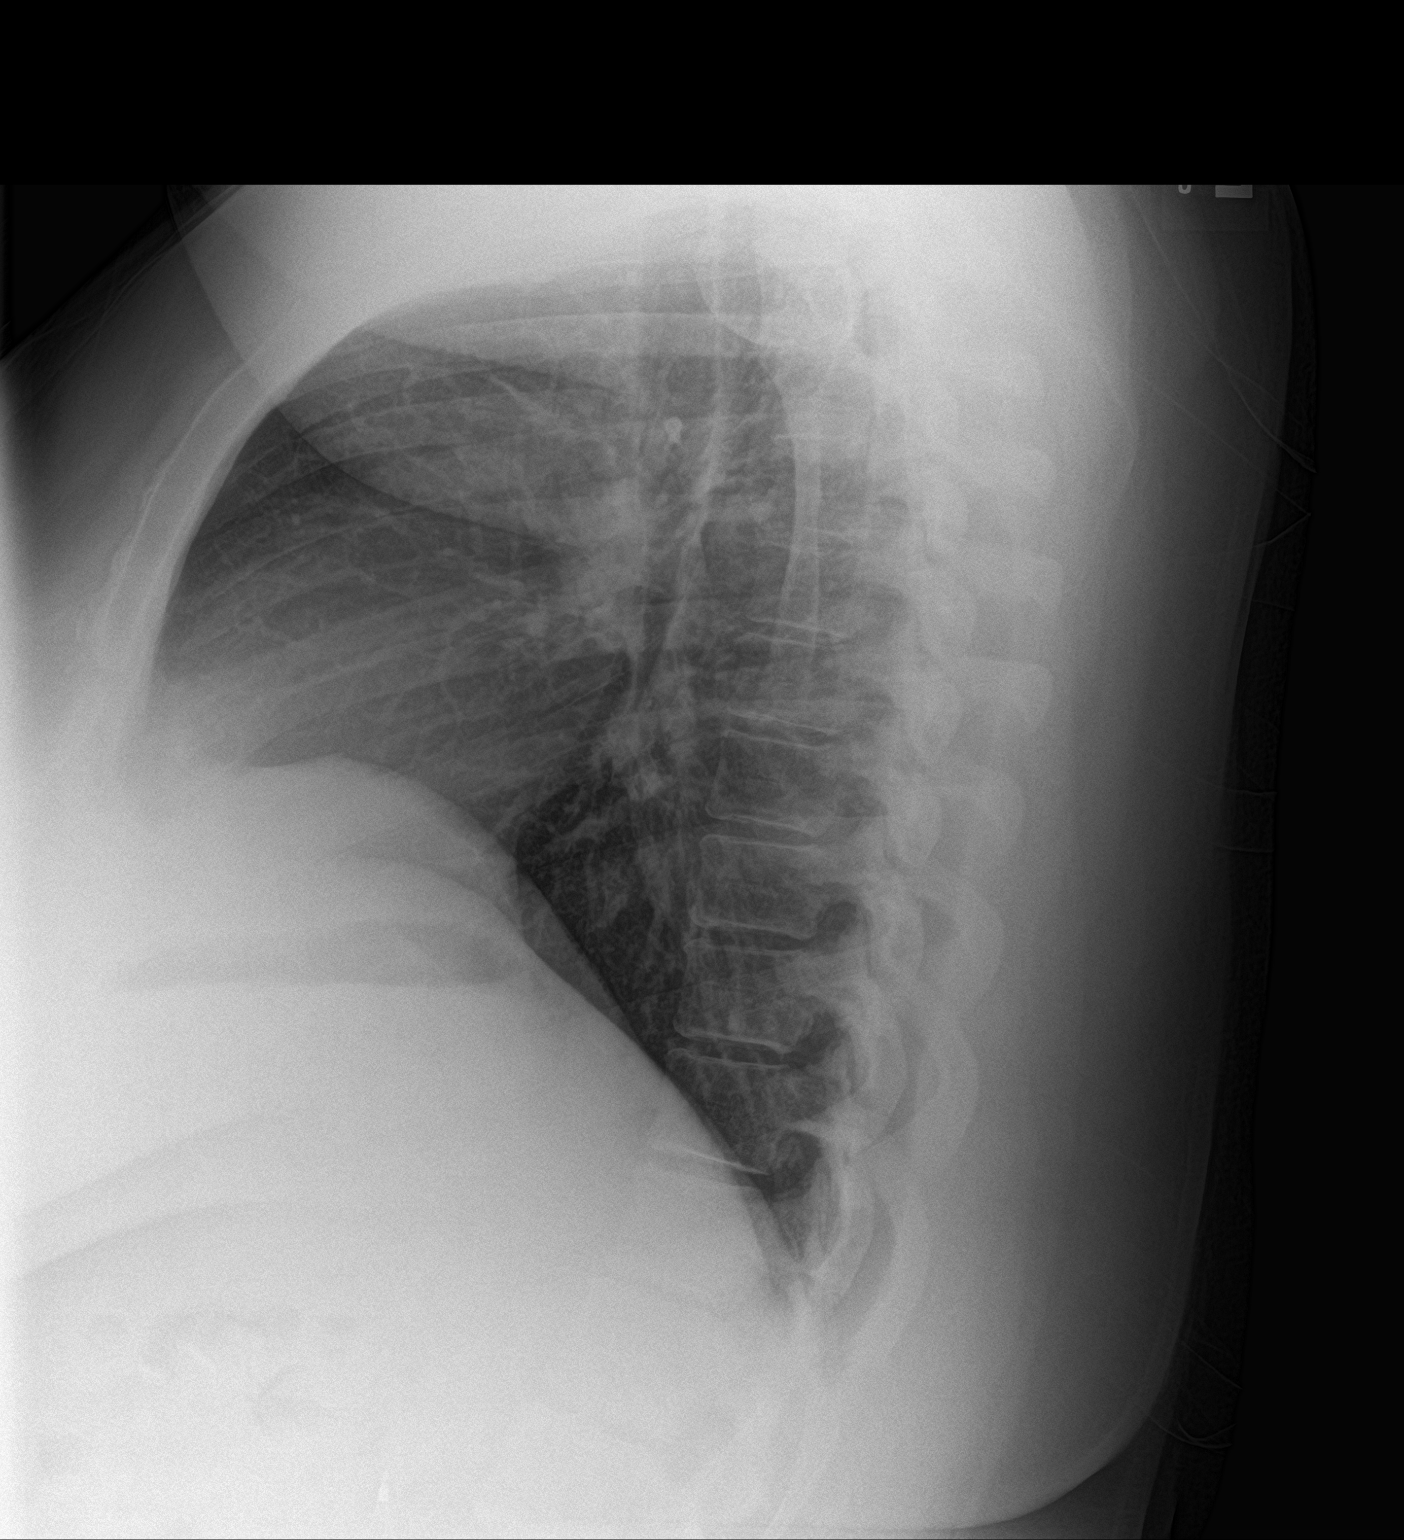

[2 of 2 positions shown; findings below may reference images not displayed]

FINDINGS: The heart size and mediastinal contours are within normal limits.
Both lungs are clear. The visualized skeletal structures are
unremarkable.
IMPRESSION: No active cardiopulmonary disease.

## 2017-03-05 MED ORDER — ENOXAPARIN SODIUM 100 MG/ML ~~LOC~~ SOLN
SUBCUTANEOUS | Status: AC
  Filled 2017-03-05: qty 1

## 2017-03-05 MED ORDER — IOPAMIDOL (ISOVUE-370) INJECTION 76%
100.0000 mL | Freq: Once | INTRAVENOUS | Status: AC | PRN
  Administered 2017-03-05: 100 mL via INTRAVENOUS

## 2017-03-05 MED ORDER — ALBUTEROL SULFATE (2.5 MG/3ML) 0.083% IN NEBU
5.0000 mg | INHALATION_SOLUTION | Freq: Once | RESPIRATORY_TRACT | Status: DC
  Filled 2017-03-05: qty 6

## 2017-03-05 MED ORDER — ENOXAPARIN SODIUM 80 MG/0.8ML ~~LOC~~ SOLN
80.0000 mg | Freq: Once | SUBCUTANEOUS | Status: AC
  Administered 2017-03-05: 80 mg via SUBCUTANEOUS

## 2017-03-05 NOTE — ED Triage Notes (Signed)
Pt states she has a hx of PE, and takes Lovenox for tx.  She had [employer] syndrome.  Pulse 115, other VS WNL.

## 2017-03-05 NOTE — ED Provider Notes (Signed)
[facility] Emergency Department Provider Note   ____________________________________________   I have reviewed the triage vital signs and the nursing notes.   HISTORY  Chief Complaint Shortness of Breath   History limited by: Not Limited   HPI Heather Cherry is a 31 y.o. female who presents to the emergency department today because of concern for sob and chest pain.   LOCATION:left chest DURATION:roughly 1 week TIMING: intermittent QUALITY: sharp CONTEXT: patient states that she has a history of blood clots and pes. Is currently on lovenox shots. Developed discomfort roughly 1 week ago with shortness of breath.  MODIFYING FACTORS: none ASSOCIATED SYMPTOMS: denies any fevers. Denies any cough.  Per medical record review patient has a history of PE, DVT.  Past Medical History:  Diagnosis Date  . Abnormal vaginal bleeding   . Anxiety   . Depression   . Diabetes mellitus without complication ([place])   . DVT (deep venous thrombosis) ([place])   . Fibroid   . Hypertension   . Obesity   . Pulmonary emboli ([place])   . Sinus congestion     Patient Active Problem List   Diagnosis Date Noted  . DVT (deep vein thrombosis) in pregnancy ([place]) 03/26/2016  . Hypokalemia 03/26/2016  . Anemia 03/26/2016  . Morbid obesity ([place]) 03/26/2016  . Pulmonary embolism ([place]) 03/24/2016  . Pseudotumor cerebri 03/19/2016  . Pleuritic chest pain 03/18/2016  . Chest pain 03/18/2016  . Right pulmonary embolus ([place]) 03/17/2016  . PE (pulmonary thromboembolism) ([place]) 03/12/2016  . DVT (deep venous thrombosis) ([place]) 03/12/2016  . HTN (hypertension) 03/12/2016  . Depression 03/12/2016  . Anxiety 03/12/2016  . GERD (gastroesophageal reflux disease) 03/12/2016  . Hyperglycemia 03/12/2016    Past Surgical History:  Procedure Laterality Date  . CHOLECYSTECTOMY    . PERIPHERAL VASCULAR CATHETERIZATION N/A 03/25/2016   Procedure: IVC Filter Insertion;  Surgeon: Katha Cabal, MD;  Location: [facility];  Service: Cardiovascular;  Laterality: N/A;  . WISDOM TOOTH EXTRACTION      Prior to Admission medications   Medication Sig Start Date End Date Taking? Authorizing Provider  acetaZOLAMIDE (DIAMOX) 250 MG tablet Take 500 mg by mouth 2 (two) times daily.    [provider]  cetirizine (ZYRTEC) 10 MG tablet Take 10 mg by mouth daily.    [provider]  enoxaparin (LOVENOX) 80 MG/0.8ML injection Inject 1.6 mLs (160 mg total) into the skin every 12 (twelve) hours. 03/26/16   Theodoro Grist, MD  ferrous sulfate 325 (65 FE) MG tablet Take 325 mg by mouth 3 (three) times daily with meals.    [provider]  folic acid (FOLVITE) 1 MG tablet Take 1 tablet (1 mg total) by mouth daily. 03/20/16   Lavina Hamman, MD  gabapentin (NEURONTIN) 300 MG capsule Take 300 mg by mouth 3 (three) times daily.    [provider]  metFORMIN (GLUCOPHAGE-XR) 500 MG 24 hr tablet Take 1,000 mg by mouth daily with breakfast.    [provider]  omeprazole (PRILOSEC) 20 MG capsule Take 20 mg by mouth daily.    [provider]  oxyCODONE (OXY IR/ROXICODONE) 5 MG immediate release tablet Take 1 tablet (5 mg total) by mouth every 4 (four) hours as needed for moderate pain, severe pain or breakthrough pain. 03/26/16   Theodoro Grist, MD  polyethylene glycol (MIRALAX / GLYCOLAX) packet Take 17 g by mouth daily. 03/20/16   Lavina Hamman, MD    Allergies Lisinopril  Family History  Problem Relation Age of Onset  . Cancer Paternal Grandfather   . Rheumatologic disease Neg Hx     Social History Social History   Tobacco Use  . Smoking status: Never Smoker  . Smokeless tobacco: Never Used  Substance Use Topics  . Alcohol use: Yes    Comment: occas. social  . Drug use: No    Review of Systems Constitutional: No fever/chills Eyes: No visual changes. ENT: No sore throat. Cardiovascular: Positive for chest  pain. Respiratory: Positive for  shortness of breath. Gastrointestinal: No abdominal pain.  No nausea, no vomiting.  No diarrhea.   Genitourinary: Negative for dysuria. Musculoskeletal: Negative for back pain. Skin: Negative for rash. Neurological: Negative for headaches, focal weakness or numbness.  ____________________________________________   PHYSICAL EXAM:  VITAL SIGNS: ED Triage Vitals [03/05/17 2157]  Enc Vitals Group     BP (!) 147/91     Pulse Rate (!) 115     Resp 18     Temp 98.3 F (36.8 C)     Temp Source Oral     SpO2 98 %   Constitutional: Alert and oriented. Well appearing and in no distress. Eyes: Conjunctivae are normal.  ENT   Head: Normocephalic and atraumatic.   Nose: No congestion/rhinnorhea.   Mouth/Throat: Mucous membranes are moist.   Neck: No stridor. Hematological/Lymphatic/Immunilogical: No cervical lymphadenopathy. Cardiovascular: Normal rate, regular rhythm.  No murmurs, rubs, or gallops.  Respiratory: Normal respiratory effort without tachypnea nor retractions. Breath sounds are clear and equal bilaterally. No wheezes/rales/rhonchi. Gastrointestinal: Soft and non tender. No rebound. No guarding.  Genitourinary: Deferred Musculoskeletal: Normal range of motion in all extremities. No lower extremity edema. Neurologic:  Normal speech and language. No gross focal neurologic deficits are appreciated.  Skin:  Skin is warm, dry and intact. No rash noted. Psychiatric: Mood and affect are normal. Speech and behavior are normal. Patient exhibits appropriate insight and judgment.  ____________________________________________    LABS (pertinent positives/negatives)  INR 1.04 PTT 52 Trop <0.03 BMP k 2.8 CBC wbc 9.7, hgb 12.8  ____________________________________________   EKG  I, Nance Pear, attending physician, personally viewed and interpreted this EKG  EKG Time: 2155 Rate: 116 Rhythm: sinus tachycardia Axis:  normal Intervals: qtc 636 QRS: LVH ST changes: no st elevation Impression: abnormal ekg   ____________________________________________    RADIOLOGY  CXR No acute disease  CT angio No obvious PE  ____________________________________________   PROCEDURES  Procedures  ____________________________________________   INITIAL IMPRESSION / ASSESSMENT AND PLAN / ED COURSE  Pertinent labs & imaging results that were available during my care of the patient were reviewed by me and considered in my medical decision making (see chart for details).  Differential diagnosis includes, but is not limited to, ACS, aortic dissection, pulmonary embolism, cardiac tamponade, pneumothorax, pneumonia, pericarditis, myocarditis, GI-related causes including esophagitis/gastritis, and musculoskeletal chest wall pain.   Patients work up without obvious PE. No pneumonia. Patient is on lovenox. Given negative work up do feel comfortable discharging patient to home. Discussed findings with patient.  ____________________________________________   FINAL CLINICAL IMPRESSION(S) / ED DIAGNOSES  Final diagnoses:  Chest pain, unspecified type  Hypokalemia     Note: This dictation was prepared with Dragon dictation. Any transcriptional errors that result from this process are unintentional     Nance Pear, MD 03/06/17 [phone]

## 2017-03-05 NOTE — ED Notes (Signed)
Report from kelly, rn 

## 2017-03-05 NOTE — ED Notes (Signed)
Patient transported to CT 

## 2017-03-05 NOTE — ED Notes (Signed)
Attempt made by kelly, rn x2 for iv initiation without success prior to this rn attempts.

## 2017-03-05 NOTE — ED Notes (Signed)
Pt ambulatory to BR with steady gait.

## 2017-03-06 LAB — PROTIME-INR
INR: 1.04
PROTHROMBIN TIME: 13.5 s (ref 11.4–15.2)

## 2017-03-06 LAB — APTT: APTT: 52 s — AB (ref 24–36)

## 2017-03-06 MED ORDER — SODIUM CHLORIDE 0.9 % IV BOLUS (SEPSIS)
1000.0000 mL | Freq: Once | INTRAVENOUS | Status: AC
  Administered 2017-03-06: 1000 mL via INTRAVENOUS

## 2017-03-06 MED ORDER — POTASSIUM CHLORIDE CRYS ER 20 MEQ PO TBCR
40.0000 meq | EXTENDED_RELEASE_TABLET | Freq: Once | ORAL | Status: AC
  Administered 2017-03-06: 40 meq via ORAL
  Filled 2017-03-06: qty 2

## 2017-03-06 NOTE — Discharge Instructions (Signed)
Please seek medical attention for any high fevers, chest pain, shortness of breath, change in behavior, persistent vomiting, bloody stool or any other new or concerning symptoms.  

## 2017-03-06 NOTE — ED Notes (Signed)
Pt with hr up to 132 while standing and not ambulating. md notified, order for ns bolus received.

## 2017-03-06 NOTE — ED Notes (Signed)
Pt with hr to 94 after 831mL of ns administered. Will finish bag and notify md.

## 2017-03-29 ENCOUNTER — Emergency Department
Admission: EM | Admit: 2017-03-29 | Discharge: 2017-03-29 | Disposition: A | Discharge status: Home / Self Care | Payer: BC Managed Care – PPO | Attending: Emergency Medicine | Admitting: Emergency Medicine

## 2017-03-29 ENCOUNTER — Other Ambulatory Visit: Payer: Self-pay

## 2017-03-29 ENCOUNTER — Encounter: Payer: Self-pay | Admitting: Emergency Medicine

## 2017-03-29 ENCOUNTER — Emergency Department: Payer: BC Managed Care – PPO

## 2017-03-29 DIAGNOSIS — Z79899 Other long term (current) drug therapy: Secondary | ICD-10-CM | POA: Insufficient documentation

## 2017-03-29 DIAGNOSIS — R42 Dizziness and giddiness: Secondary | ICD-10-CM | POA: Insufficient documentation

## 2017-03-29 DIAGNOSIS — Z7984 Long term (current) use of oral hypoglycemic drugs: Secondary | ICD-10-CM | POA: Insufficient documentation

## 2017-03-29 DIAGNOSIS — E119 Type 2 diabetes mellitus without complications: Secondary | ICD-10-CM | POA: Insufficient documentation

## 2017-03-29 DIAGNOSIS — I10 Essential (primary) hypertension: Secondary | ICD-10-CM | POA: Diagnosis not present

## 2017-03-29 DIAGNOSIS — Z86711 Personal history of pulmonary embolism: Secondary | ICD-10-CM | POA: Diagnosis not present

## 2017-03-29 DIAGNOSIS — R0602 Shortness of breath: Secondary | ICD-10-CM | POA: Diagnosis not present

## 2017-03-29 LAB — BASIC METABOLIC PANEL
Anion gap: 6 (ref 5–15)
BUN: 13 mg/dL (ref 6–20)
CHLORIDE: 111 mmol/L (ref 101–111)
CO2: 21 mmol/L — ABNORMAL LOW (ref 22–32)
Calcium: 8.7 mg/dL — ABNORMAL LOW (ref 8.9–10.3)
Creatinine, Ser: 0.56 mg/dL (ref 0.44–1.00)
GFR calc non Af Amer: >60 mL/min (ref >60)
Glucose, Bld: 116 mg/dL — ABNORMAL HIGH (ref 65–99)
POTASSIUM: 3.3 mmol/L — AB (ref 3.5–5.1)
SODIUM: 138 mmol/L (ref 135–145)

## 2017-03-29 LAB — CBC
HEMATOCRIT: 38.3 % (ref 35.0–47.0)
Hemoglobin: 12.2 g/dL (ref 12.0–16.0)
MCH: 25.8 pg — ABNORMAL LOW (ref 26.0–34.0)
MCHC: 31.9 g/dL — ABNORMAL LOW (ref 32.0–36.0)
MCV: 80.7 fL (ref 80.0–100.0)
PLATELETS: 324 10*3/uL (ref 150–440)
RBC: 4.75 MIL/uL (ref 3.80–5.20)
RDW: 16.5 % — ABNORMAL HIGH (ref 11.5–14.5)
WBC: 8.6 10*3/uL (ref 3.6–11.0)

## 2017-03-29 LAB — TROPONIN I: Troponin I: <0.03 ng/mL (ref <0.03)

## 2017-03-29 IMAGING — CT CT ANGIO CHEST
2 of 6 series · 18 of 46 positions shown · IV contrast (APPLIED)
Comparison: Chest CT [DATE] and chest radiograph
[DATE] ; prior chest CT [DATE]

CLINICAL DATA: Shortness of Breath.  History of pulmonary emboli

EXAM:
CT ANGIOGRAPHY CHEST WITH CONTRAST
TECHNIQUE: Multidetector CT imaging of the chest was performed using the
standard protocol during bolus administration of intravenous
contrast. Multiplanar CT image reconstructions and MIPs were
obtained to evaluate the vascular anatomy.
CONTRAST:  75mL [XA] IOPAMIDOL ([XA]) INJECTION 76%

[Series 6: thins · axial · 0.98mm/px · z∈[-293,-61]mm · 16 of 256 slices shown]
[im 12/256  lung]
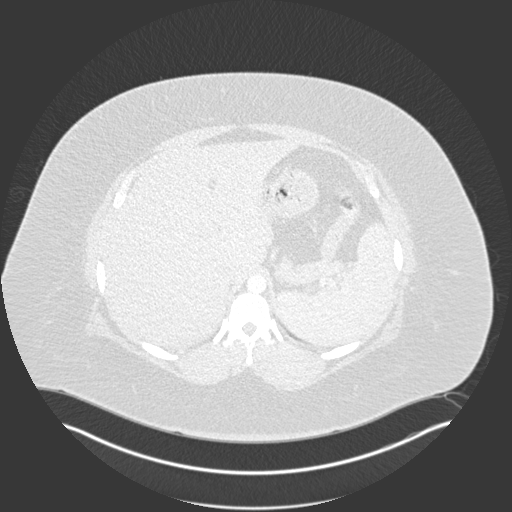
[im 34/256  soft-tissue]
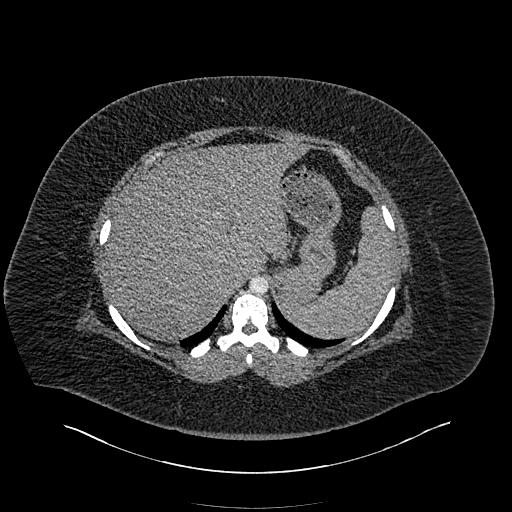
[im 45/256  lung]
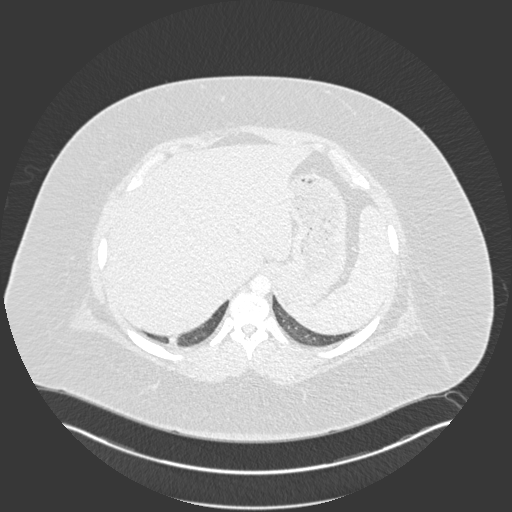
[im 56/256  soft-tissue]
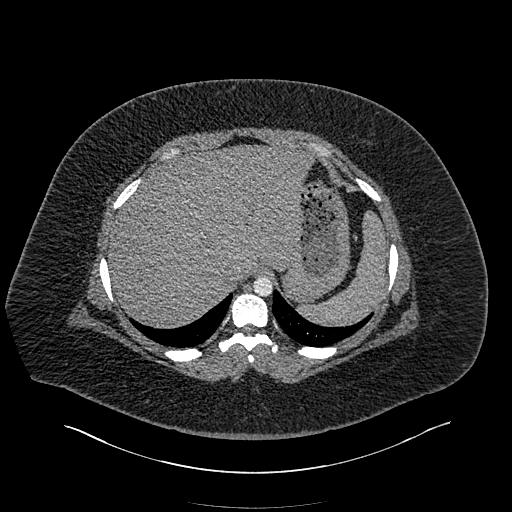
[im 78/256  lung]
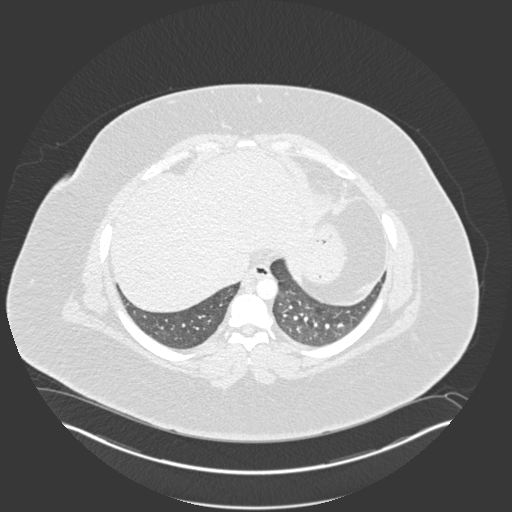
[im 89/256  soft-tissue]
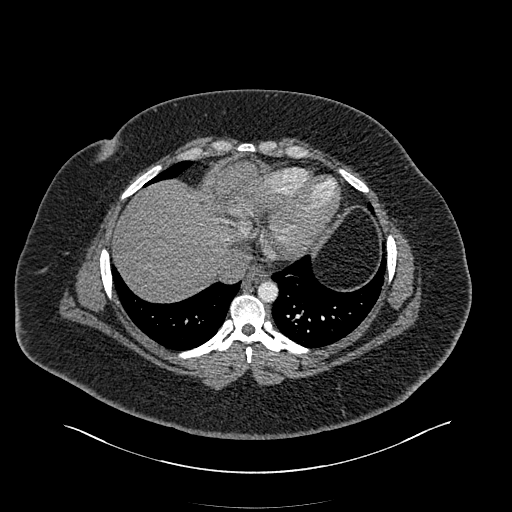
[im 100/256  lung]
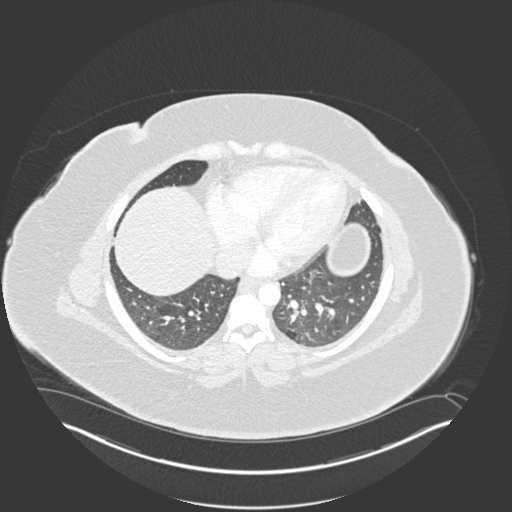
[im 122/256  soft-tissue]
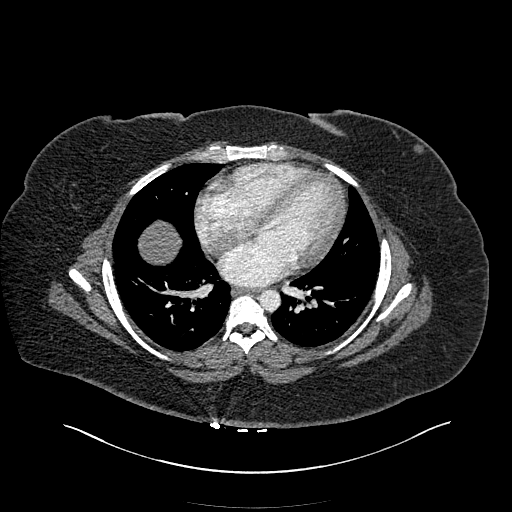
[im 134/256  lung]
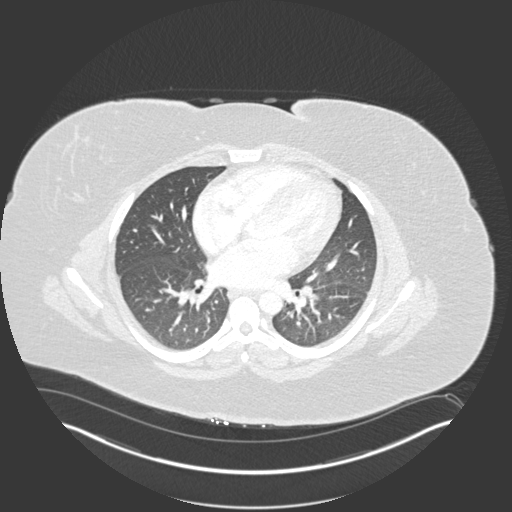
[im 156/256  soft-tissue]
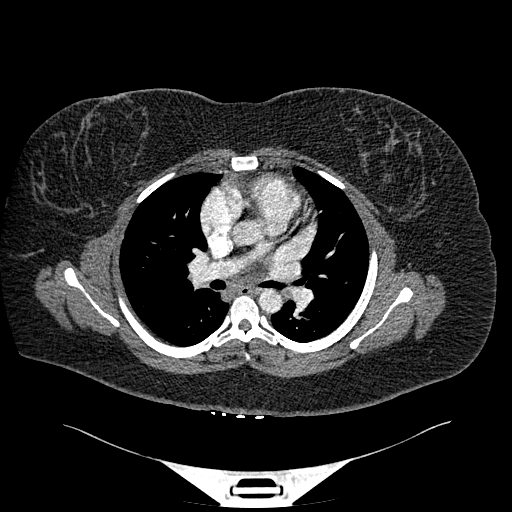
[im 167/256  lung]
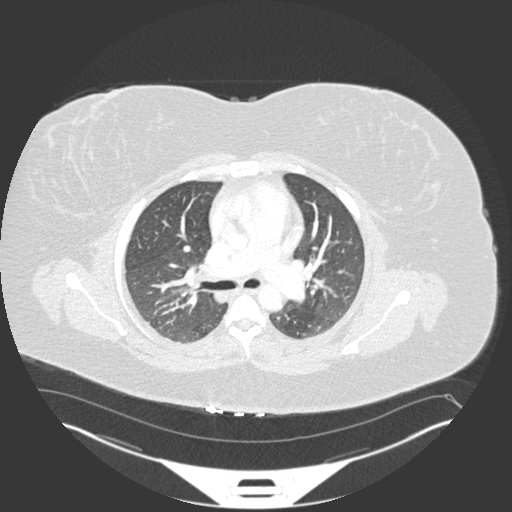
[im 178/256  soft-tissue]
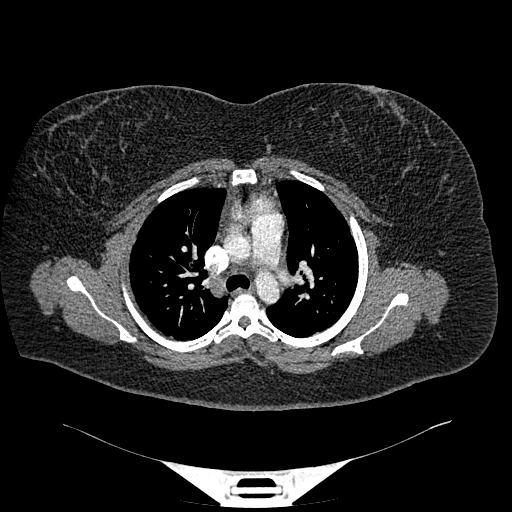
[im 200/256  lung]
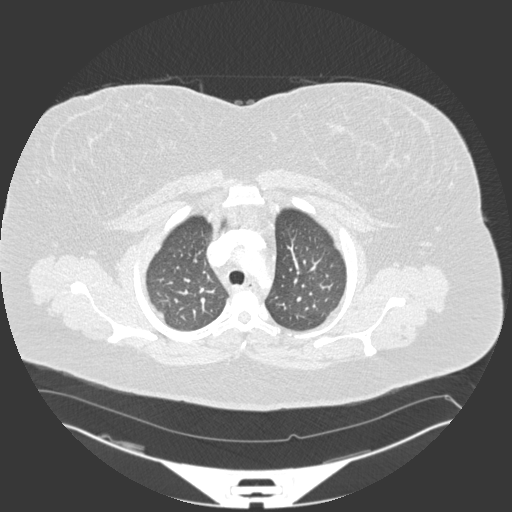
[im 211/256  soft-tissue]
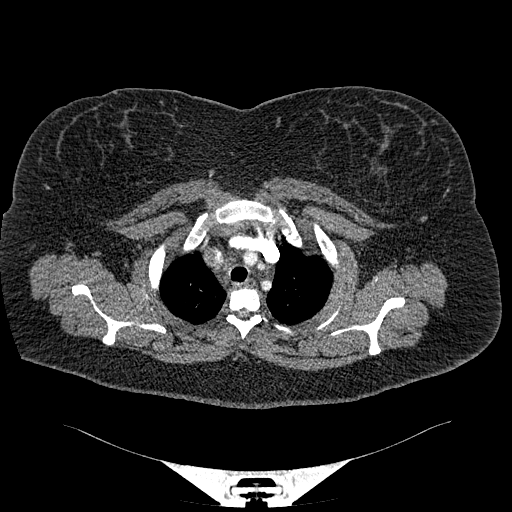
[im 222/256  lung]
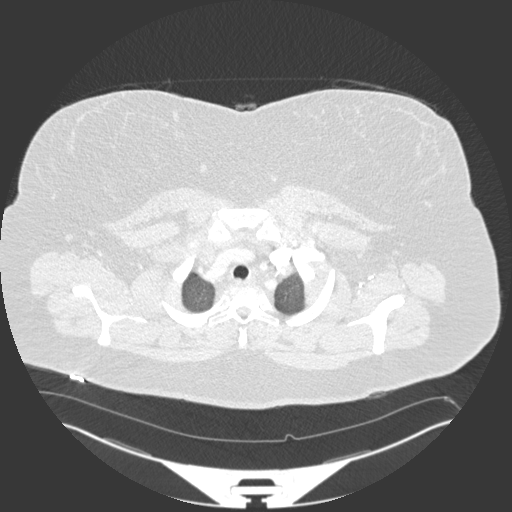
[im 244/256  soft-tissue]
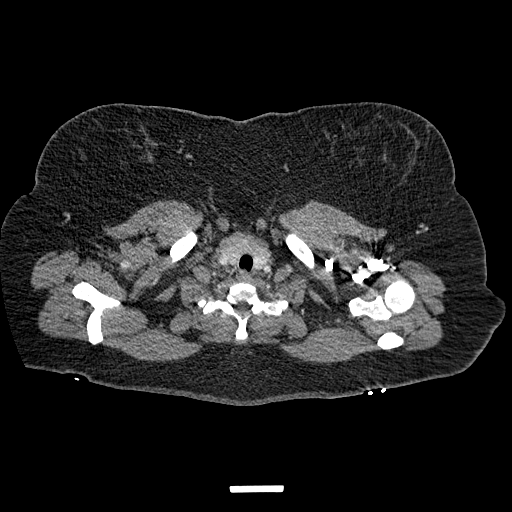

[Series 8: coronal mpr · coronal · 0.50mm/px · 2 of 99 slices shown]
[im 33/99  soft-tissue]
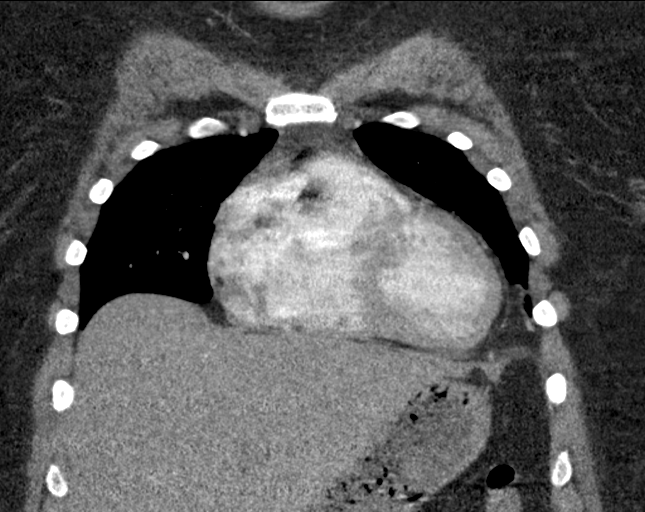
[im 66/99  soft-tissue]
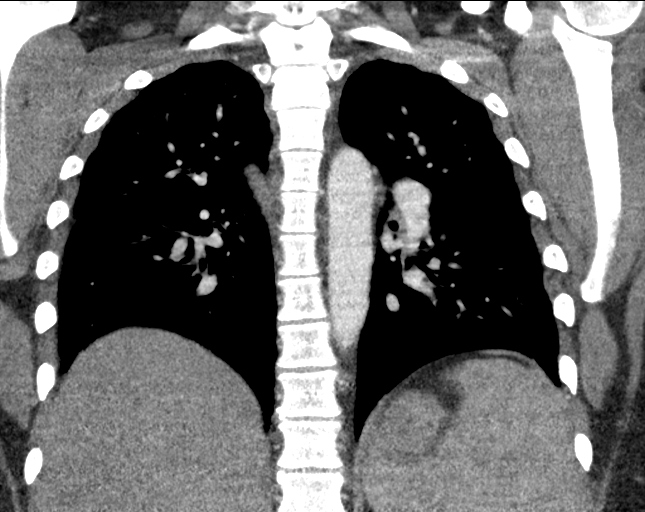

[18 of 46 positions shown; findings below may reference images not displayed]

FINDINGS: Cardiovascular: There is no demonstrable pulmonary embolus. There is
no thoracic aortic aneurysm or dissection. Visualized great vessels
appear unremarkable. There is no pericardial thickening or
pericardial effusion.

Mediastinum/Nodes: Visualized thyroid appears normal. There is no
appreciable thoracic adenopathy. No esophageal lesions are evident.

Lungs/Pleura: There is no appreciable edema or consolidation. There
is a stable 8 x 8 mm nodular opacity in the posterior segment of the
right lower lobe inferiorly. Note that this nodular opacity is
smaller compared to [DATE]. No new pulmonary nodular
opacities evident.

Upper Abdomen: Visualized upper abdominal structures appear normal.

Musculoskeletal: No blastic or lytic bone lesions.

Review of the MIP images confirms the above findings.
IMPRESSION: 1.  No evident pulmonary embolus.

2. No lung edema or consolidation. Persistent 8 mm nodular opacity
inferior posterior right base. Question residua of prior infarct. No
new nodular opacities evident.

3.  No evident adenopathy.

## 2017-03-29 IMAGING — CR DG CHEST 2V
2 series · 2 of 2 positions shown · non-contrast
Comparison: [DATE]

CLINICAL DATA: Short of breath

EXAM:
CHEST  2 VIEW

[chest pa]
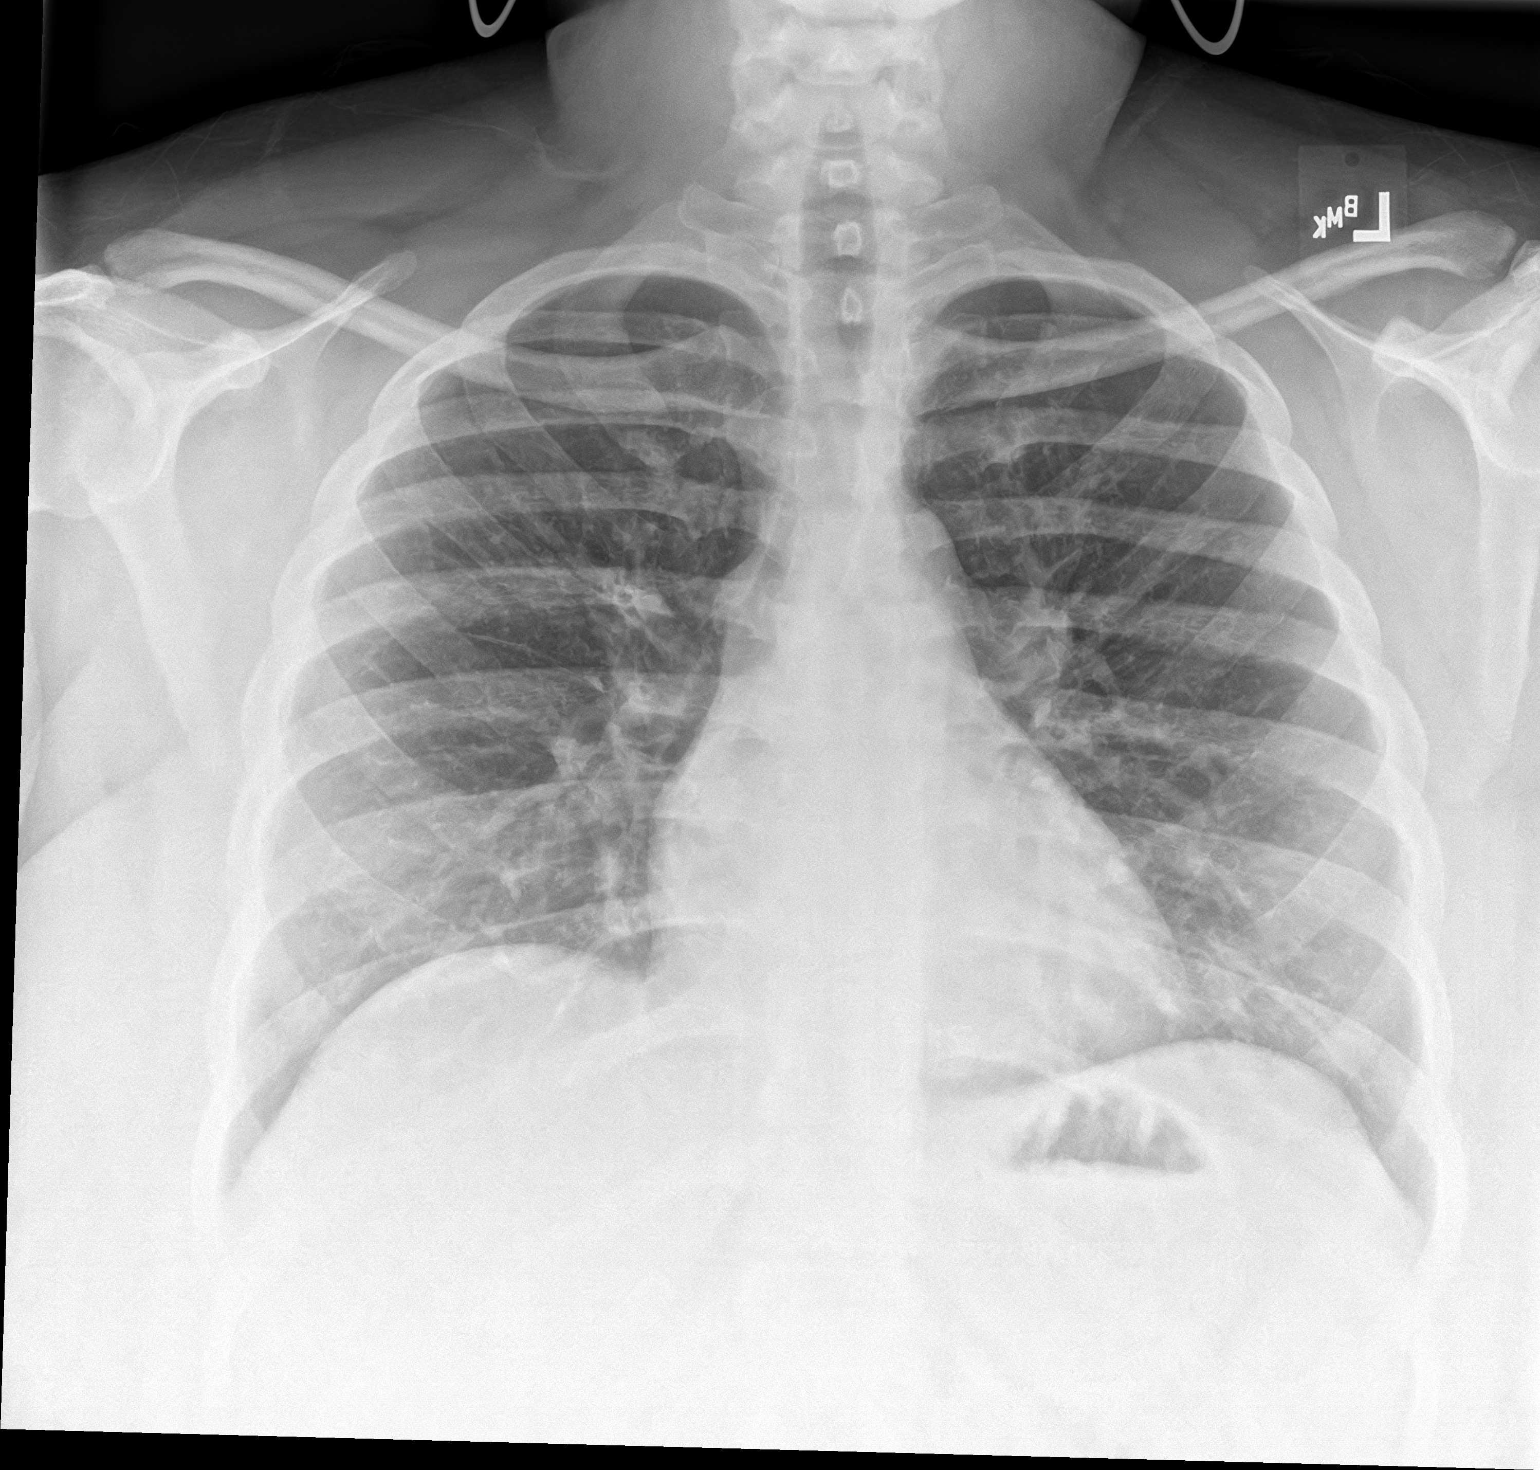

[chest lat]
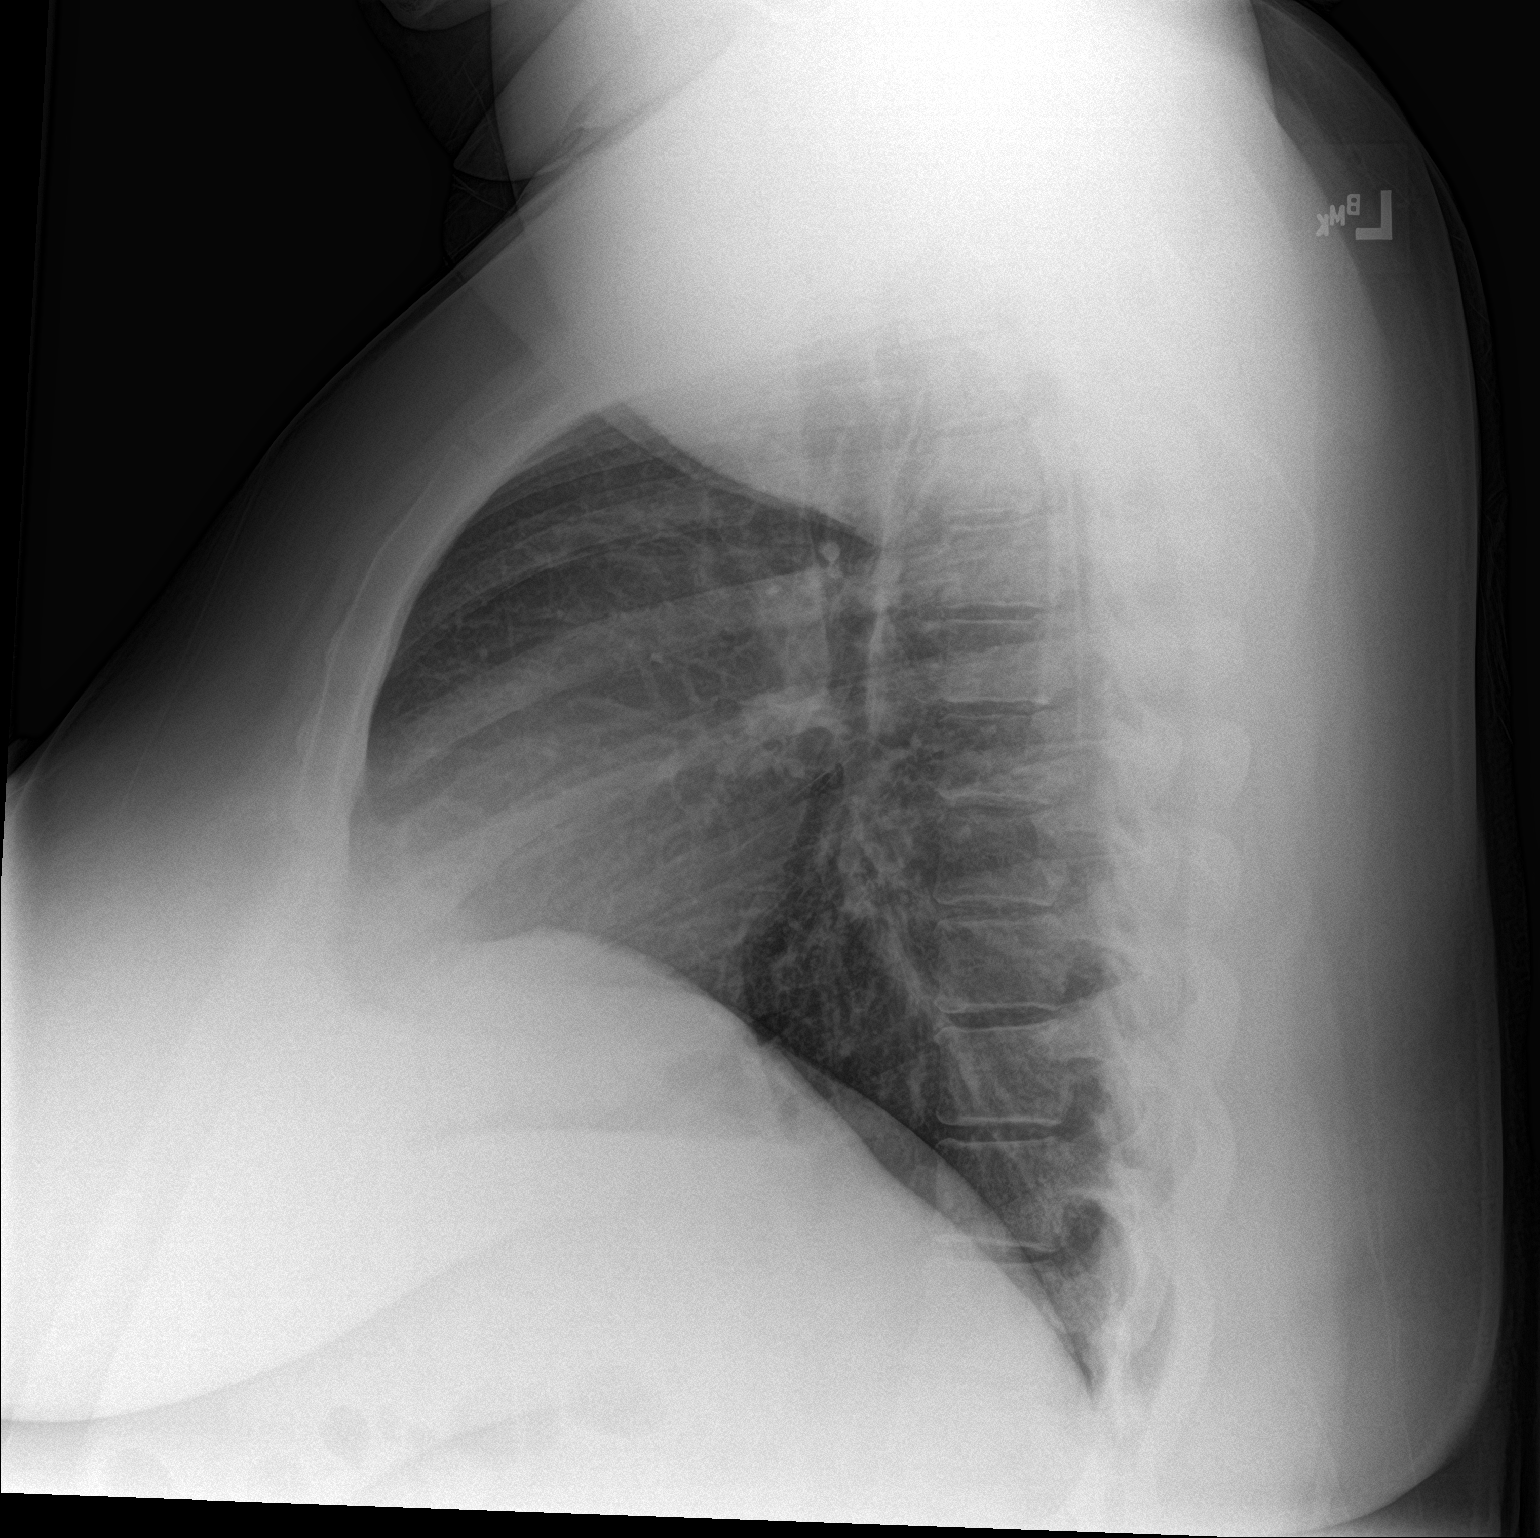

[2 of 2 positions shown; findings below may reference images not displayed]

FINDINGS: The heart size and mediastinal contours are within normal limits.
Both lungs are clear. The visualized skeletal structures are
unremarkable.
IMPRESSION: No active cardiopulmonary disease.

## 2017-03-29 MED ORDER — ALBUTEROL SULFATE (2.5 MG/3ML) 0.083% IN NEBU
5.0000 mg | INHALATION_SOLUTION | Freq: Once | RESPIRATORY_TRACT | Status: AC
  Administered 2017-03-29: 5 mg via RESPIRATORY_TRACT
  Filled 2017-03-29: qty 6

## 2017-03-29 MED ORDER — ALBUTEROL SULFATE HFA 108 (90 BASE) MCG/ACT IN AERS: 2.0000 | INHALATION_SPRAY | Freq: Four times a day (QID) | RESPIRATORY_TRACT | 2 refills | Status: DC | PRN

## 2017-03-29 MED ORDER — IOPAMIDOL (ISOVUE-370) INJECTION 76%
75.0000 mL | Freq: Once | INTRAVENOUS | Status: AC | PRN
  Administered 2017-03-29: 75 mL via INTRAVENOUS
  Filled 2017-03-29: qty 75

## 2017-03-29 NOTE — ED Provider Notes (Signed)
[facility] Emergency Department Provider Note  ____________________________________________  Time seen: Approximately 8:40 PM  I have reviewed the triage vital signs and the nursing notes.   HISTORY  Chief Complaint Shortness of Breath   HPI Heather Cherry is a 31 y.o. 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 Patient reports that since Thanksgiving she has been feeling more short of breath with minimal exertion. 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 She is complaining of a sharp left-sided chest pain that has been constant for several days and worse today, the pain is nonpleuritic in nature and feels like a muscle pull. She reports that her symptoms are identical to her prior PE. She has had a cough that is dry with no hemoptysis. She denies leg pain or swelling. She denies missing any Lovenox shots. Patient has been on lovenox for 1 year and has not had a clot while on it.  Past Medical History:  Diagnosis Date  . Abnormal vaginal bleeding   . Anxiety   . Depression   . Diabetes mellitus without complication ([place])   . DVT (deep venous thrombosis) ([place])   . Fibroid   . Hypertension   . Obesity   . Pulmonary emboli ([place])   . Sinus congestion     Patient Active Problem List   Diagnosis Date Noted  . DVT (deep vein thrombosis) in pregnancy ([place]) 03/26/2016  . Hypokalemia 03/26/2016  . Anemia 03/26/2016  . Morbid obesity ([place]) 03/26/2016  . Pulmonary embolism ([place]) 03/24/2016  . Pseudotumor cerebri 03/19/2016  . Pleuritic chest pain 03/18/2016  . Chest pain 03/18/2016  . Right pulmonary embolus ([place]) 03/17/2016  . PE (pulmonary thromboembolism) ([place]) 03/12/2016  . DVT (deep venous thrombosis) ([place]) 03/12/2016  . HTN (hypertension) 03/12/2016  . Depression 03/12/2016  . Anxiety  03/12/2016  . GERD (gastroesophageal reflux disease) 03/12/2016  . Hyperglycemia 03/12/2016    Past Surgical History:  Procedure Laterality Date  . CHOLECYSTECTOMY    . PERIPHERAL VASCULAR CATHETERIZATION N/A 03/25/2016   Procedure: IVC Filter Insertion;  Surgeon: Katha Cabal, MD;  Location: [place] [facility];  Service: Cardiovascular;  Laterality: N/A;  . WISDOM TOOTH EXTRACTION      Prior to Admission medications   Medication Sig Start Date End Date Taking? Authorizing Provider  acetaZOLAMIDE (DIAMOX) 250 MG tablet Take 500 mg by mouth 2 (two) times daily.    [provider]  amLODipine (NORVASC) 5 MG tablet Take 5 mg by mouth daily.    [provider]  cetirizine (ZYRTEC) 10 MG tablet Take 10 mg by mouth daily.    [provider]  enoxaparin (LOVENOX) 80 MG/0.8ML injection Inject 1.6 mLs (160 mg total) into the skin every 12 (twelve) hours. 03/26/16   Theodoro Grist, MD  ferrous sulfate 325 (65 FE) MG tablet Take 325 mg by mouth 3 (three) times daily with meals.    [provider]  metFORMIN (GLUCOPHAGE-XR) 500 MG 24 hr tablet Take 1,000 mg by mouth daily with breakfast.    [provider]  metoprolol succinate (TOPROL-XL) 25 MG 24 hr tablet Take 25 mg by mouth daily.    [provider]  omeprazole (PRILOSEC) 20 MG capsule Take 20 mg by mouth daily.    [provider]  rizatriptan (MAXALT) 10  MG tablet Take 10 mg by mouth as needed for migraine. May repeat in 2 hours if needed    [provider]    Allergies Lisinopril  Family History  Problem Relation Age of Onset  . Cancer Paternal Grandfather   . Rheumatologic disease Neg Hx     Social History Social History   Tobacco Use  . Smoking status: Never Smoker  . Smokeless tobacco: Never Used  Substance Use Topics  . Alcohol use: Yes    Comment: occas. social  . Drug use: No    Review of Systems  Constitutional: Negative for fever. +  dizziness Eyes: Negative for visual changes. ENT: Negative for sore throat. Neck: No neck pain  Cardiovascular: + chest pain. Respiratory: + shortness of breath, cough Gastrointestinal: Negative for abdominal pain, vomiting or diarrhea. Genitourinary: Negative for dysuria. Musculoskeletal: Negative for back pain. Skin: Negative for rash. Neurological: Negative for headaches, weakness or numbness. Psych: No SI or HI  ____________________________________________   PHYSICAL EXAM:  VITAL SIGNS: ED Triage Vitals  Enc Vitals Group     BP 03/29/17 1912 (!) 173/78     Pulse Rate 03/29/17 1912 93     Resp 03/29/17 1912 18     Temp 03/29/17 1912 98 F (36.7 C)     Temp Source 03/29/17 1912 Oral     SpO2 03/29/17 1912 100 %     Weight 03/29/17 1913 (!) 368 lb (166.9 kg)     Height 03/29/17 1913 5\' 9"  (1.753 m)     Head Circumference --      Peak Flow --      Pain Score 03/29/17 1911 8     Pain Loc --      Pain Edu? --      Excl. in [place]? --     Constitutional: Alert and oriented. Well appearing and in no apparent distress. HEENT:      Head: Normocephalic and atraumatic.         Eyes: Conjunctivae are normal. Sclera is non-icteric.       Mouth/Throat: Mucous membranes are moist.       Neck: Supple with no signs of meningismus. Cardiovascular: Regular rate and rhythm. No murmurs, gallops, or rubs. 2+ symmetrical distal pulses are present in all extremities. No JVD. Respiratory: Normal respiratory effort. Lungs are clear to auscultation bilaterally. No wheezes, crackles, or rhonchi.  Gastrointestinal: Soft, non tender, and non distended with positive bowel sounds. No rebound or guarding. Musculoskeletal: RLE is larger than LLE (chronic per patient). Neurologic: Normal speech and language. Face is symmetric. Moving all extremities. No gross focal neurologic deficits are appreciated. Skin: Skin is warm, dry and intact. No rash noted. Psychiatric: Mood and affect are normal. Speech and  behavior are normal.  ____________________________________________   LABS (all labs ordered are listed, but only abnormal results are displayed)  Labs Reviewed  BASIC METABOLIC PANEL - Abnormal; Notable for the following components:      Result Value   Potassium 3.3 (*)    CO2 21 (*)    Glucose, Bld 116 (*)    Calcium 8.7 (*)    All other components within normal limits  CBC - Abnormal; Notable for the following components:   MCH 25.8 (*)    MCHC 31.9 (*)    RDW 16.5 (*)    All other components within normal limits  TROPONIN I  HCG, QUANTITATIVE, PREGNANCY   ____________________________________________  EKG  ED ECG REPORT I, Rudene Re, the attending  physician, personally viewed and interpreted this ECG.  Normal sinus rhythm, rate of 86, normal intervals, normal axis, no ST elevations or depressions.  ____________________________________________  RADIOLOGY  CXR: Negative  CTA chest: 1. No evident pulmonary embolus.  2. No lung edema or consolidation. Persistent 8 mm nodular opacity inferior posterior right base. Question residua of prior infarct. No new nodular opacities evident.  3. No evident adenopathy.  ____________________________________________   PROCEDURES  Procedure(s) performed: None Procedures Critical Care performed:  None ____________________________________________   INITIAL IMPRESSION / ASSESSMENT AND PLAN / ED COURSE  31 y.o. 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 Patient is well-appearing, in no distress, normal vital signs, her right lower shin that is bigger than the left however this is chronic since she had a DVT. She endorses compliance with Lovenox. Unfortunately PE is in the differential diagnosis and patient will undergo yet another CT angiogram at this time. Patient concerned  about developing cancer from so many PEs which is understandable however she has had PEs while on anticoagulation before and therefore I think she is high risk enough that warrants repeat imaging. Clinical Course as of Mar 29 2230  Mon Mar 29, 2017  2228 CTA negative for PE or any other acute findings. Patient requesting albuterol treatment. Tells me she used to be on albuterol when she was younger but has no h/o asthma and is not a smoker. Will provide patient with albuterol and reassess. With negative CT, normal EKG and labs patient is safe for discharge and f/u with PCP.  [CV]    Clinical Course User Index [CV] Alfred Levins [place], MD     As part of my medical decision making, I reviewed the following data within the [facility] notes reviewed and incorporated, Labs reviewed , EKG interpreted , Old EKG reviewed, Old chart reviewed, Radiograph reviewed , Notes from prior ED visits and [place] Controlled Substance Database    Pertinent labs & imaging results that were available during my care of the patient were reviewed by me and considered in my medical decision making (see chart for details).    ____________________________________________   FINAL CLINICAL IMPRESSION(S) / ED DIAGNOSES  Final diagnoses:  SOB (shortness of breath)      NEW MEDICATIONS STARTED DURING THIS VISIT:  ED Discharge Orders    None       Note:  This document was prepared using Dragon voice recognition software and may include unintentional dictation errors.    Rudene Re, MD 03/29/17 2231

## 2017-03-29 NOTE — ED Notes (Signed)
Pt out of room asking if she can have her IV removed.

## 2017-03-29 NOTE — ED Notes (Signed)
Pt to the ER because when she talks she feels like she is going to faint. Pt reports SOB with exertion. Pt has green productive cough. Pt denies fever. Pt is currently on lovenox. HX of PE. Last CT done on Nov 23rd. Pt reports an absence of symptoms when pt is sitting down not performing any activity. Only pain is on the left side which felt similar to blood clot which is worse with exertion.

## 2017-03-29 NOTE — ED Notes (Signed)
Pt. Returned to tx. room in stable condition with no acute changes since departure from unit for scans.   

## 2017-03-29 NOTE — ED Triage Notes (Addendum)
Pt presents to ED with c/o feeling sob and states when she is talking she "feels like she is going to fall out". Pt states she has a hx PE and DVT. Denies recent illness or chest pain. Pt states her 'lungs hurt" described as burning and pulling, but states her "chest does not hurt like she is having a heart attack". Pt seen in this ED for the same approx a month ago and was told she was dehydrated. Pt states she thinks her potassium is really low and that's probably why she is felling like this. Pt currently has no increased work of breathing or acute dustress noted. Skin warm and dry.

## 2017-03-29 NOTE — ED Notes (Signed)
ED Provider at bedside. 

## 2017-03-29 NOTE — ED Notes (Signed)

## 2017-04-23 ENCOUNTER — Encounter (INDEPENDENT_AMBULATORY_CARE_PROVIDER_SITE_OTHER): Payer: Self-pay | Admitting: Vascular Surgery

## 2017-04-23 ENCOUNTER — Encounter (INDEPENDENT_AMBULATORY_CARE_PROVIDER_SITE_OTHER): Payer: Self-pay

## 2017-04-23 ENCOUNTER — Ambulatory Visit (INDEPENDENT_AMBULATORY_CARE_PROVIDER_SITE_OTHER): Payer: BC Managed Care – PPO | Admitting: Vascular Surgery

## 2017-04-23 VITALS — BP 124/74 | HR 92 | Resp 17 | Ht 69.0 in | Wt 375.0 lb

## 2017-04-23 DIAGNOSIS — I825Y1 Chronic embolism and thrombosis of unspecified deep veins of right proximal lower extremity: Secondary | ICD-10-CM | POA: Diagnosis not present

## 2017-04-23 DIAGNOSIS — E119 Type 2 diabetes mellitus without complications: Secondary | ICD-10-CM | POA: Diagnosis not present

## 2017-04-23 DIAGNOSIS — I2782 Chronic pulmonary embolism: Secondary | ICD-10-CM | POA: Diagnosis not present

## 2017-04-23 DIAGNOSIS — I10 Essential (primary) hypertension: Secondary | ICD-10-CM | POA: Diagnosis not present

## 2017-04-23 NOTE — Assessment & Plan Note (Signed)
blood pressure control important in reducing the progression of atherosclerotic disease. On appropriate oral medications.  

## 2017-04-23 NOTE — Assessment & Plan Note (Signed)
The patient had an IVC filter placed a little over a year ago for recurrent DVT and PE.  At this point, she does not have any active or acute DVT in her postphlebitic symptoms are reasonably mild.  She is tolerating Lovenox.  We discussed the risks and benefits of filter removal.  As a young woman, not surprisingly she desires to have the filter removed and I think that is very reasonable.  This can be removed at her convenience in the near future.  Risks and benefits including inability to remove the filter were discussed.

## 2017-04-23 NOTE — Progress Notes (Signed)
Patient ID: Heather Cherry, female   DOB: Oct 21, 1985, 32 y.o.   MRN: [id number]  Chief Complaint  Patient presents with  . New Patient (Initial Visit)    Discuss filter removal    HPI Heather Cherry is a 32 y.o. female.  Patient present for evaluation of IVC filter removal.  She has a long history of recurrent thromboembolic disease, and a little over a year ago she had an IVC filter next.  At that time, it was told to her that this could be removed if she so desired.  Her hematologist preferred she wait until well after her previous thromboembolic event.  She is currently on Lovenox and tolerating this well without any recurrent thrombosis.  She wears her compression stockings daily which has helped reduce her postphlebitic symptoms.  She does still have some swelling and occasional pain in the right leg.  Current Outpatient Medications  Medication Sig Dispense Refill  . acetaminophen (TYLENOL) 325 MG tablet Take by mouth.    Marland Kitchen acetaZOLAMIDE (DIAMOX) 250 MG tablet Take 500 mg by mouth 2 (two) times daily.    Marland Kitchen albuterol (PROVENTIL HFA;VENTOLIN HFA) 108 (90 Base) MCG/ACT inhaler Inhale 2 puffs into the lungs every 6 (six) hours as needed for wheezing or shortness of breath. 1 Inhaler 2  . amLODipine (NORVASC) 5 MG tablet Take 5 mg by mouth daily.    . cetirizine (ZYRTEC) 10 MG tablet Take 10 mg by mouth daily.    Marland Kitchen enoxaparin (LOVENOX) 80 MG/0.8ML injection Inject 1.6 mLs (160 mg total) into the skin every 12 (twelve) hours. 120 Syringe 2  . ferrous sulfate 325 (65 FE) MG tablet Take 325 mg by mouth 3 (three) times daily with meals.    . metFORMIN (GLUCOPHAGE-XR) 500 MG 24 hr tablet Take 1,000 mg by mouth daily with breakfast.    . metoprolol succinate (TOPROL-XL) 25 MG 24 hr tablet Take 25 mg by mouth daily.    Marland Kitchen omeprazole (PRILOSEC) 20 MG capsule Take 20 mg by mouth daily.    . rizatriptan (MAXALT) 10 MG tablet Take 10 mg by mouth as needed for migraine. May repeat in 2 hours if needed      . cholestyramine (QUESTRAN) 4 g packet     . hydrochlorothiazide (HYDRODIURIL) 25 MG tablet      No current facility-administered medications for this visit.      Past Medical History:  Diagnosis Date  . Abnormal vaginal bleeding   . Anxiety   . Depression   . Diabetes mellitus without complication ([place])   . DVT (deep venous thrombosis) ([place])   . Fibroid   . Hypertension   . Obesity   . Pulmonary emboli ([place])   . Sinus congestion     Past Surgical History:  Procedure Laterality Date  . CHOLECYSTECTOMY    . PERIPHERAL VASCULAR CATHETERIZATION N/A 03/25/2016   Procedure: IVC Filter Insertion;  Surgeon: Katha Cabal, MD;  Location: [facility];  Service: Cardiovascular;  Laterality: N/A;  . WISDOM TOOTH EXTRACTION      Family History  Problem Relation Age of Onset  . Cancer Paternal Grandfather   . Rheumatologic disease Neg Hx   Cousin with history of clots as well No aneurysms or autoimmune diseases  Social History Social History   Tobacco Use  . Smoking status: Never Smoker  . Smokeless tobacco: Never Used  Substance Use Topics  . Alcohol use: Yes    Comment: occas. social  . Drug use:  No   With  Allergies  Allergen Reactions  . Lisinopril Cough        REVIEW OF SYSTEMS (Negative unless checked)  Constitutional: _0 Weight loss  _1 Fever  _2 Chills Cardiac: _3 Chest pain   _4 Chest pressure   _5 Palpitations   _6 Shortness of breath when laying flat   _7 Shortness of breath at rest   _8 Shortness of breath with exertion. Vascular:  _9 Pain in legs with walking   _10 Pain in legs at rest   _11 Pain in legs when laying flat   _12 Claudication   _13 Pain in feet when walking  _14 Pain in feet at rest  _15 Pain in feet when laying flat   _16 History of DVT   _17 Phlebitis   _18 Swelling in legs   _19 Varicose veins   _20 Non-healing ulcers Pulmonary:   _21 Uses home oxygen   _22 Productive cough   _23 Hemoptysis   _24 Wheeze  _25 COPD   _26 Asthma Neurologic:  _27 Dizziness   _28 Blackouts   _29 Seizures   _30 History of stroke   _31 History of TIA  _32 Aphasia   _33 Temporary blindness   _34 Dysphagia   _35 Weakness or numbness in arms   _36 Weakness or numbness in legs Musculoskeletal:  _37 Arthritis   _38 Joint swelling   _39 Joint pain   _40 Low back pain Hematologic:  _41 Easy bruising  _42 Easy bleeding   _43 Hypercoagulable state   _44 Anemic  _45 Hepatitis  Gastrointestinal:  _46 Blood in stool   _47 Vomiting blood  _48 Gastroesophageal reflux/heartburn   _49 Difficulty swallowing   Genitourinary:  _50 Chronic kidney disease   _51 Difficult urination  _52 Frequent urination  _53 Burning with urination   _54 Blood in urine Skin:  _55 Rashes   _56 Ulcers   _57 Wounds Psychological:  _58 History of anxiety   _59  History of major depression.  Physical Exam BP 124/74 (BP Location: Right Arm, Patient Position: Sitting)   Pulse 92   Resp 17   Ht _60  (1.753 m)   Wt (!) 375 lb (170.1 kg)   BMI 55.38 kg/m   Gen:  WD/WN, NAD.  Obese Head: [place]/AT, No temporalis wasting.  Ear/Nose/Throat: Hearing grossly intact, nares w/o erythema or drainage, oropharynx w/o Erythema/Exudate Eyes: Sclera non-icteric, conjunctiva clear Neck: Trachea midline.  No JVD.  Pulmonary:  Good air movement, no use of accessory muscles.  Cardiac: RRR, no JVD Vascular:  Vessel Right Left  Radial Palpable Palpable                                    Musculoskeletal: M/S 5/5 throughout.  Extremities without ischemic changes.  No deformity or atrophy.  1+ right lower extremity edema. Neurologic:  Sensation grossly intact in extremities.  Symmetrical.  Speech is fluent. Motor exam as listed above. Psychiatric: Judgment intact, Mood & affect appropriate for pt's clinical situation. Dermatologic: No rashes or ulcers noted.  No cellulitis or open wounds.     Radiology Dg Chest 2 View  Result Date: 03/29/2017 CLINICAL DATA:  Short of breath EXAM: CHEST  2 VIEW COMPARISON:  03/05/2017 FINDINGS: The heart size and mediastinal contours are  within normal limits. Both lungs are clear. The visualized skeletal structures are unremarkable. IMPRESSION: No active cardiopulmonary disease. Electronically Signed   By: Franchot Gallo M.D.   On: 03/29/2017 20:13   Ct Angio Chest Pe W And/or Wo Contrast  Result Date: 03/29/2017 CLINICAL DATA:  Shortness of Breath.  History of pulmonary emboli EXAM: CT ANGIOGRAPHY CHEST WITH CONTRAST TECHNIQUE: Multidetector CT imaging of the chest was performed using the standard protocol during bolus  administration of intravenous contrast. Multiplanar CT image reconstructions and MIPs were obtained to evaluate the vascular anatomy. CONTRAST:  24m ISOVUE-370 IOPAMIDOL (ISOVUE-370) INJECTION 76% COMPARISON:  Chest CT March 05, 2017 and chest radiograph March 29, 2017 ; prior chest CT May 26, 2016 FINDINGS: Cardiovascular: There is no demonstrable pulmonary embolus. There is no thoracic aortic aneurysm or dissection. Visualized great vessels appear unremarkable. There is no pericardial thickening or pericardial effusion. Mediastinum/Nodes: Visualized thyroid appears normal. There is no appreciable thoracic adenopathy. No esophageal lesions are evident. Lungs/Pleura: There is no appreciable edema or consolidation. There is a stable 8 x 8 mm nodular opacity in the posterior segment of the right lower lobe inferiorly. Note that this nodular opacity is smaller compared to May 26, 2016. No new pulmonary nodular opacities evident. Upper Abdomen: Visualized upper abdominal structures appear normal. Musculoskeletal: No blastic or lytic bone lesions. Review of the MIP images confirms the above findings. IMPRESSION: 1.  No evident pulmonary embolus. 2. No lung edema or consolidation. Persistent 8 mm nodular opacity inferior posterior right base. Question residua of prior infarct. No new nodular opacities evident. 3.  No evident adenopathy. Electronically Signed   By: WLowella GripIII M.D.   On: 03/29/2017 22:01     Labs Recent Results (from the past 2160 hour(s))  Basic metabolic panel     Status: Abnormal   Collection Time: 03/05/17 10:28 PM  Result Value Ref Range   Sodium 136 135 - 145 mmol/L   Potassium 2.8 (L) 3.5 - 5.1 mmol/L   Chloride 103 101 - 111 mmol/L   CO2 19 (L) 22 - 32 mmol/L   Glucose, Bld 141 (H) 65 - 99 mg/dL   BUN 11 6 - 20 mg/dL   Creatinine, Ser 0.72 0.44 - 1.00 mg/dL   Calcium 9.2 8.9 - 10.3 mg/dL   GFR calc non Af Amer >60 >60 mL/min   GFR calc Af Amer >60 >60 mL/min    Comment: (NOTE) The eGFR has been calculated using the CKD EPI equation. This calculation has not been validated in all clinical situations. eGFR's persistently <60 mL/min signify possible Chronic Kidney Disease.    Anion gap 14 5 - 15  CBC     Status: Abnormal   Collection Time: 03/05/17 10:28 PM  Result Value Ref Range   WBC 9.7 3.6 - 11.0 K/uL   RBC 4.93 3.80 - 5.20 MIL/uL   Hemoglobin 12.8 12.0 - 16.0 g/dL   HCT 39.2 35.0 - 47.0 %   MCV 79.4 (L) 80.0 - 100.0 fL   MCH 26.0 26.0 - 34.0 pg   MCHC 32.8 32.0 - 36.0 g/dL   RDW 14.7 (H) 11.5 - 14.5 %   Platelets 340 150 - 440 K/uL  Troponin I     Status: None   Collection Time: 03/05/17 10:28 PM  Result Value Ref Range   Troponin I <0.03 <0.03 ng/mL  Protime-INR     Status: None   Collection Time: 03/05/17 10:28 PM  Result Value Ref Range   Prothrombin Time 13.5 11.4 - 15.2 seconds   INR 1.04   APTT     Status: Abnormal   Collection Time: 03/05/17 10:28 PM  Result Value Ref Range   aPTT 52 (H) 24 - 36 seconds    Comment:        IF BASELINE aPTT IS ELEVATED, SUGGEST PATIENT RISK ASSESSMENT BE USED TO DETERMINE APPROPRIATE ANTICOAGULANT THERAPY.   Basic metabolic panel     Status: Abnormal  Collection Time: 03/29/17  7:29 PM  Result Value Ref Range   Sodium 138 135 - 145 mmol/L   Potassium 3.3 (L) 3.5 - 5.1 mmol/L   Chloride 111 101 - 111 mmol/L   CO2 21 (L) 22 - 32 mmol/L   Glucose, Bld 116 (H) 65 - 99 mg/dL   BUN 13 6 - 20  mg/dL   Creatinine, Ser 0.56 0.44 - 1.00 mg/dL   Calcium 8.7 (L) 8.9 - 10.3 mg/dL   GFR calc non Af Amer >60 >60 mL/min   GFR calc Af Amer >60 >60 mL/min    Comment: (NOTE) The eGFR has been calculated using the CKD EPI equation. This calculation has not been validated in all clinical situations. eGFR's persistently <60 mL/min signify possible Chronic Kidney Disease.    Anion gap 6 5 - 15  CBC     Status: Abnormal   Collection Time: 03/29/17  7:29 PM  Result Value Ref Range   WBC 8.6 3.6 - 11.0 K/uL   RBC 4.75 3.80 - 5.20 MIL/uL   Hemoglobin 12.2 12.0 - 16.0 g/dL   HCT 38.3 35.0 - 47.0 %   MCV 80.7 80.0 - 100.0 fL   MCH 25.8 (L) 26.0 - 34.0 pg   MCHC 31.9 (L) 32.0 - 36.0 g/dL   RDW 16.5 (H) 11.5 - 14.5 %   Platelets 324 150 - 440 K/uL  Troponin I     Status: None   Collection Time: 03/29/17  7:29 PM  Result Value Ref Range   Troponin I <0.03 <0.03 ng/mL    Assessment/Plan:  Pulmonary embolism (HCC) Now on Lovenox and tolerating this well.  HTN (hypertension) blood pressure control important in reducing the progression of atherosclerotic disease. On appropriate oral medications.   Morbid obesity (HCC) Increases her risk of pain and swelling in her legs from postphlebitic syndrome  Diabetes mellitus without complication (HCC) blood glucose control important in reducing the progression of atherosclerotic disease. Also, involved in wound healing. On appropriate medications.   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  At this point, she does not have any active or acute DVT in her postphlebitic symptoms are reasonably mild.  She is tolerating Lovenox.  We discussed the risks and benefits of filter removal.  As a young woman, not surprisingly she desires to have the filter removed and I think that is very reasonable.  This can be removed at her convenience in the near future.  Risks and benefits  including inability to remove the filter were discussed.     Leotis Pain 04/23/2017, 9:45 AM   This note was created with [facility] medical dictation system.  Any errors from dictation are unintentional.

## 2017-04-23 NOTE — Assessment & Plan Note (Signed)
Now on Lovenox and tolerating this well.

## 2017-04-23 NOTE — Assessment & Plan Note (Signed)
blood glucose control important in reducing the progression of atherosclerotic disease. Also, involved in wound healing. On appropriate medications.  

## 2017-04-23 NOTE — Assessment & Plan Note (Signed)
Increases her risk of pain and swelling in her legs from postphlebitic syndrome

## 2017-04-23 NOTE — Patient Instructions (Signed)
Inferior Vena Cava Filter Insertion, Care After °This sheet gives you information about how to care for yourself after your procedure. Your health care provider may also give you more specific instructions. If you have problems or questions, contact your health care provider. °What can I expect after the procedure? °After your procedure, it is common to have: °· Mild pain in the area where the filter was inserted. °· Mild bruising in the area where the filter was inserted. ° °Follow these instructions at home: °Insertion site care °· Follow instructions from your health care provider about how to take care of the site where a catheter was inserted at your neck or groin (insertion site). Make sure you: °? Wash your hands with soap and water before you change your bandage (dressing). If soap and water are not available, use hand sanitizer. °? Change your dressing as told by your health care provider. °· Check your insertion site every day for signs of infection. Check for: °? More redness, swelling, or pain. °? More fluid or blood. °? Warmth. °? Pus or a bad smell. °· Keep the insertion site clean and dry. °· Do not shower, bathe, use a hot tub, or let the dressing get wet until your health care provider approves. °General instructions °· Take over-the-counter and prescription medicines only as told by your health care provider. °· Avoid heavy lifting or hard activities for 48 hours after the procedure or as told by your health care provider. °· Do not drive for 24 hours if you were given a a medicine to help you relax (sedative). °· Do not drive or use heavy machinery while taking prescription pain medicine. °· Do not go back to school or work until your health care provider approves. °· Keep all follow-up visits as told by your health care provider. This is important. °Contact a health care provider if: °· You have more redness, swelling, or pain around your insertion site. °· You have more fluid or blood coming  from your insertion site. °· Your insertion site feels warm to the touch. °· You have pus or a bad smell coming from your insertion site. °· You have a fever. °· You are dizzy. °· You have nausea and vomiting. °· You develop a rash. °Get help right away if: °· You develop chest pain, a cough, or difficulty breathing. °· You develop shortness of breath, feel faint, or pass out. °· You cough up blood. °· You have severe pain in your abdomen. °· You develop swelling and discoloration or pain in your legs. °· Your legs become pale and cold or blue. °· You develop weakness, difficulty moving your arms or legs, or balance problems. °· You develop problems with speech or vision. °These symptoms may represent a serious problem that is an emergency. Do not wait to see if the symptoms will go away. Get medical help right away. Call your local emergency services (911 in the [place] Do not drive yourself to the hospital. °Summary °· After your insertion procedure, it is common to have mild pain and bruising. °· Do not shower, bathe, use a hot tub, or let the dressing get wet until your health care provider approves. °· Every day, check for signs of infection where a catheter was inserted at your neck or groin (insertion site). °This information is not intended to replace advice given to you by your health care provider. Make sure you discuss any questions you have with your health care provider. °Document Released: 01/18/2013 Document   Revised: 02/19/2016 Document Reviewed: 02/19/2016 °Elsevier Interactive Patient Education © 2017 [employer] ° °

## 2017-04-26 ENCOUNTER — Other Ambulatory Visit (INDEPENDENT_AMBULATORY_CARE_PROVIDER_SITE_OTHER): Payer: Self-pay | Admitting: Vascular Surgery

## 2017-04-28 MED ORDER — CEFAZOLIN SODIUM-DEXTROSE 2-4 GM/100ML-% IV SOLN: 2.0000 g | Freq: Once | INTRAVENOUS | Status: DC

## 2017-04-29 ENCOUNTER — Encounter: Payer: Self-pay | Admitting: *Deleted

## 2017-04-29 ENCOUNTER — Ambulatory Visit
Admission: RE | Admit: 2017-04-29 | Discharge: 2017-04-29 | Disposition: A | Discharge status: Home / Self Care | Payer: BC Managed Care – PPO | Source: Ambulatory Visit | Attending: Vascular Surgery | Admitting: Vascular Surgery

## 2017-04-29 ENCOUNTER — Encounter
Admission: RE | Disposition: A | Discharge status: Home / Self Care | Payer: Self-pay | Source: Ambulatory Visit | Attending: Vascular Surgery

## 2017-04-29 DIAGNOSIS — I10 Essential (primary) hypertension: Secondary | ICD-10-CM | POA: Insufficient documentation

## 2017-04-29 DIAGNOSIS — I2782 Chronic pulmonary embolism: Secondary | ICD-10-CM | POA: Insufficient documentation

## 2017-04-29 DIAGNOSIS — E119 Type 2 diabetes mellitus without complications: Secondary | ICD-10-CM | POA: Diagnosis not present

## 2017-04-29 DIAGNOSIS — Z6841 Body Mass Index (BMI) 40.0 and over, adult: Secondary | ICD-10-CM | POA: Insufficient documentation

## 2017-04-29 DIAGNOSIS — Z452 Encounter for adjustment and management of vascular access device: Secondary | ICD-10-CM | POA: Insufficient documentation

## 2017-04-29 DIAGNOSIS — Z9049 Acquired absence of other specified parts of digestive tract: Secondary | ICD-10-CM | POA: Diagnosis not present

## 2017-04-29 DIAGNOSIS — Z79899 Other long term (current) drug therapy: Secondary | ICD-10-CM | POA: Insufficient documentation

## 2017-04-29 DIAGNOSIS — Z7984 Long term (current) use of oral hypoglycemic drugs: Secondary | ICD-10-CM | POA: Insufficient documentation

## 2017-04-29 DIAGNOSIS — Z86718 Personal history of other venous thrombosis and embolism: Secondary | ICD-10-CM | POA: Insufficient documentation

## 2017-04-29 DIAGNOSIS — F419 Anxiety disorder, unspecified: Secondary | ICD-10-CM | POA: Diagnosis not present

## 2017-04-29 DIAGNOSIS — Z809 Family history of malignant neoplasm, unspecified: Secondary | ICD-10-CM | POA: Diagnosis not present

## 2017-04-29 DIAGNOSIS — Z7902 Long term (current) use of antithrombotics/antiplatelets: Secondary | ICD-10-CM | POA: Diagnosis not present

## 2017-04-29 DIAGNOSIS — Z9889 Other specified postprocedural states: Secondary | ICD-10-CM | POA: Diagnosis not present

## 2017-04-29 DIAGNOSIS — Z888 Allergy status to other drugs, medicaments and biological substances status: Secondary | ICD-10-CM | POA: Insufficient documentation

## 2017-04-29 DIAGNOSIS — I82402 Acute embolism and thrombosis of unspecified deep veins of left lower extremity: Secondary | ICD-10-CM

## 2017-04-29 HISTORY — PX: IVC FILTER REMOVAL: CATH118246

## 2017-04-29 LAB — PREGNANCY, URINE: PREG TEST UR: NEGATIVE

## 2017-04-29 SURGERY — IVC FILTER REMOVAL
Anesthesia: Moderate Sedation

## 2017-04-29 MED ORDER — FENTANYL CITRATE (PF) 100 MCG/2ML IJ SOLN
INTRAMUSCULAR | Status: AC
  Filled 2017-04-29: qty 2

## 2017-04-29 MED ORDER — MIDAZOLAM HCL 5 MG/5ML IJ SOLN
INTRAMUSCULAR | Status: AC
  Filled 2017-04-29: qty 5

## 2017-04-29 MED ORDER — CEFAZOLIN SODIUM-DEXTROSE 2-4 GM/100ML-% IV SOLN
INTRAVENOUS | Status: AC
  Filled 2017-04-29: qty 100

## 2017-04-29 MED ORDER — IOPAMIDOL (ISOVUE-300) INJECTION 61%
INTRAVENOUS | Status: DC | PRN
  Administered 2017-04-29: 15 mL via INTRAVENOUS

## 2017-04-29 MED ORDER — LIDOCAINE-EPINEPHRINE (PF) 1 %-1:200000 IJ SOLN
INTRAMUSCULAR | Status: AC
  Filled 2017-04-29: qty 30

## 2017-04-29 MED ORDER — HYDROMORPHONE HCL 1 MG/ML IJ SOLN: 1.0000 mg | Freq: Once | INTRAMUSCULAR | Status: DC | PRN

## 2017-04-29 MED ORDER — MIDAZOLAM HCL 2 MG/2ML IJ SOLN
INTRAMUSCULAR | Status: DC | PRN
  Administered 2017-04-29: 2 mg via INTRAVENOUS
  Administered 2017-04-29: 1 mg via INTRAVENOUS

## 2017-04-29 MED ORDER — HEPARIN (PORCINE) IN NACL 2-0.9 UNIT/ML-% IJ SOLN
INTRAMUSCULAR | Status: AC
  Filled 2017-04-29: qty 500

## 2017-04-29 MED ORDER — ONDANSETRON HCL 4 MG/2ML IJ SOLN: 4.0000 mg | Freq: Four times a day (QID) | INTRAMUSCULAR | Status: DC | PRN

## 2017-04-29 MED ORDER — FENTANYL CITRATE (PF) 100 MCG/2ML IJ SOLN
INTRAMUSCULAR | Status: DC | PRN
  Administered 2017-04-29: 50 ug via INTRAVENOUS
  Administered 2017-04-29: 25 ug via INTRAVENOUS

## 2017-04-29 MED ORDER — SODIUM CHLORIDE 0.9 % IV SOLN
INTRAVENOUS | Status: DC
  Administered 2017-04-29: 10:00:00 via INTRAVENOUS

## 2017-04-29 SURGICAL SUPPLY — 3 items
PACK ANGIOGRAPHY (CUSTOM PROCEDURE TRAY) ×3 IMPLANT
SET VENACAVA FILTER RETRIEVAL (MISCELLANEOUS) ×3 IMPLANT
WIRE J 3MM .035X145CM (WIRE) ×3 IMPLANT

## 2017-04-29 NOTE — H&P (Signed)
[facility] History & Physical Update  The patient was interviewed and re-examined.  The patient's previous History and Physical has been reviewed and is unchanged.  There is no change in the plan of care. We plan to proceed with the scheduled procedure.  Leotis Pain, MD  04/29/2017, 9:10 AM

## 2017-04-29 NOTE — Discharge Instructions (Signed)
Post IVC Filter removal: Remove dressing from neck tomorrow. No not get dressing wet tonight. Due to moderate sedation received today, do not drive for 24 hours, do not make legal decisions, do not drink alcohol.  In event you feel something is life threatening go to emergency room and tell them you had a IVC filter removed today. If your right neck site starts bleeding apply pressure for 5-10 minutes. Then redress site.

## 2017-04-29 NOTE — Op Note (Signed)
[facility]   OPERATIVE NOTE    PRE-OPERATIVE DIAGNOSIS:  1. DVT 2. status post IVC filter placement  POST-OPERATIVE DIAGNOSIS: Same as above  PROCEDURE: 1. Ultrasound guidance for vascular access right jugular vein 2. Catheter placement into inferior vena cava from right jugular vein 3. Inferior venacavogram 4. Retrieval of Bard meridian IVC filter  SURGEON: Leotis Pain, MD  ASSISTANT(S): None  ANESTHESIA: Local with moderate conscious sedation for approximately 15 minutes using 3 mg of Versed and 75 Mcg of Fentanyl  ESTIMATED BLOOD LOSS: 10 cc  CONTRAST:  15 cc  FLUORO TIME: 1 minute  FINDING(S): 1. Patent IVC  SPECIMEN(S): IVC filter  INDICATIONS:  Patient is a 32 y.o. female who presents with a previous history of IVC filter placement. Patient has continued anticoagulation and no longer needs this filter. The patient remains on anticoagulation. Risks and benefits were discussed, and informed consent was obtained.  DESCRIPTION: After obtaining full informed written consent, the patient was brought back to the vascular suite and placed supine upon the table.Moderate conscious sedation was administered during a face to face encounter with the patient throughout the procedure with my supervision of the RN administering medicines and monitoring the patient's vital signs, pulse oximetry, telemetry and mental status throughout from the start of the procedure until the patient was taken to the recovery room.  After obtaining adequate anesthesia, the patient was prepped and draped in the standard fashion. The right jugular vein was visualized with ultrasound and found to be widely patent. It was then accessed under direct ultrasound guidance without difficulty with the Seldinger needle and a permanent image was recorded. A J-wire was placed. After skin nick and dilatation, the retrieval sheath was placed over the wire and advanced into the  inferior vena cava. Inferior vena cava was imaged and found to be widely patent on inferior venacavogram. The filter was straight in its orientation. The retrieval snare was then placed through the sheath and the hook of the filter was snared without difficulty. The sheath was then advanced, and the filter was collapsed and brought into the sheath in its entirety. It was then removed from the body in its entirety. The retrieval sheath was then removed. Pressure was held at the access site and sterile dressing was placed. The patient was taken to the recovery room in stable condition having tolerated the procedure well.  COMPLICATIONS: None  CONDITION: Stable   Leotis Pain 04/29/2017 11:06 AM  This note was created with Dragon Medical transcription system. Any errors in dictation are purely unintentional.

## 2019-06-17 ENCOUNTER — Ambulatory Visit: Payer: BC Managed Care – PPO | Attending: Internal Medicine

## 2019-06-17 DIAGNOSIS — Z23 Encounter for immunization: Secondary | ICD-10-CM | POA: Insufficient documentation

## 2019-06-17 NOTE — Progress Notes (Signed)
   Covid-19 Vaccination Clinic  Name:  Heather Cherry    MRN: [id number] DOB: 1986-03-03  06/17/2019  [name] was observed post Covid-19 immunization for 15 minutes without incident. She was provided with Vaccine Information Sheet and instruction to access the V-Safe system.   [name] was instructed to call 911 with any severe reactions post vaccine: Marland Kitchen Difficulty breathing  . Swelling of face and throat  . A fast heartbeat  . A bad rash all over body  . Dizziness and weakness   Immunizations Administered    Name Date Dose VIS Date Route   Pfizer COVID-19 Vaccine 06/17/2019 10:32 AM 0.3 mL 03/24/2019 Intramuscular   Manufacturer: [place]   Lot: [id number]   [place]: [id number]

## 2019-07-08 ENCOUNTER — Ambulatory Visit: Payer: BC Managed Care – PPO | Attending: Internal Medicine

## 2019-07-08 DIAGNOSIS — Z23 Encounter for immunization: Secondary | ICD-10-CM

## 2019-07-08 NOTE — Progress Notes (Signed)
   Covid-19 Vaccination Clinic  Name:  Heather Cherry    MRN: [id number] DOB: 16-May-1985  07/08/2019  [name] was observed post Covid-19 immunization for 15 minutes without incident. She was provided with Vaccine Information Sheet and instruction to access the V-Safe system.   [name] was instructed to call 911 with any severe reactions post vaccine: Marland Kitchen Difficulty breathing  . Swelling of face and throat  . A fast heartbeat  . A bad rash all over body  . Dizziness and weakness   Immunizations Administered    Name Date Dose VIS Date Route   Pfizer COVID-19 Vaccine 07/08/2019 10:42 AM 0.3 mL 03/24/2019 Intramuscular   Manufacturer: [place]   Lot: [id number]   [place]: [id number]

## 2019-08-07 ENCOUNTER — Emergency Department
Admission: EM | Admit: 2019-08-07 | Discharge: 2019-08-07 | Disposition: A | Discharge status: Home / Self Care | Payer: BC Managed Care – PPO | Attending: Emergency Medicine | Admitting: Emergency Medicine

## 2019-08-07 ENCOUNTER — Other Ambulatory Visit: Payer: Self-pay

## 2019-08-07 DIAGNOSIS — I10 Essential (primary) hypertension: Secondary | ICD-10-CM | POA: Insufficient documentation

## 2019-08-07 DIAGNOSIS — Z7984 Long term (current) use of oral hypoglycemic drugs: Secondary | ICD-10-CM | POA: Insufficient documentation

## 2019-08-07 DIAGNOSIS — Z86711 Personal history of pulmonary embolism: Secondary | ICD-10-CM | POA: Diagnosis not present

## 2019-08-07 DIAGNOSIS — E119 Type 2 diabetes mellitus without complications: Secondary | ICD-10-CM | POA: Insufficient documentation

## 2019-08-07 DIAGNOSIS — E039 Hypothyroidism, unspecified: Secondary | ICD-10-CM | POA: Insufficient documentation

## 2019-08-07 DIAGNOSIS — R002 Palpitations: Secondary | ICD-10-CM | POA: Diagnosis not present

## 2019-08-07 DIAGNOSIS — Z79899 Other long term (current) drug therapy: Secondary | ICD-10-CM | POA: Diagnosis not present

## 2019-08-07 DIAGNOSIS — E0590 Thyrotoxicosis, unspecified without thyrotoxic crisis or storm: Secondary | ICD-10-CM

## 2019-08-07 DIAGNOSIS — Z86718 Personal history of other venous thrombosis and embolism: Secondary | ICD-10-CM | POA: Diagnosis not present

## 2019-08-07 LAB — CBC
HCT: 39.8 % (ref 36.0–46.0)
Hemoglobin: 12.6 g/dL (ref 12.0–15.0)
MCH: 24.9 pg — ABNORMAL LOW (ref 26.0–34.0)
MCHC: 31.7 g/dL (ref 30.0–36.0)
MCV: 78.5 fL — ABNORMAL LOW (ref 80.0–100.0)
Platelets: 317 10*3/uL (ref 150–400)
RBC: 5.07 MIL/uL (ref 3.87–5.11)
RDW: 14.2 % (ref 11.5–15.5)
WBC: 6.4 10*3/uL (ref 4.0–10.5)
nRBC: 0 % (ref 0.0–0.2)

## 2019-08-07 LAB — BASIC METABOLIC PANEL
Anion gap: 7 (ref 5–15)
BUN: 11 mg/dL (ref 6–20)
CO2: 22 mmol/L (ref 22–32)
Calcium: 9.1 mg/dL (ref 8.9–10.3)
Chloride: 108 mmol/L (ref 98–111)
Creatinine, Ser: 0.56 mg/dL (ref 0.44–1.00)
GFR calc Af Amer: >60 mL/min (ref >60)
GFR calc non Af Amer: >60 mL/min (ref >60)
Glucose, Bld: 113 mg/dL — ABNORMAL HIGH (ref 70–99)
Potassium: 3.6 mmol/L (ref 3.5–5.1)
Sodium: 137 mmol/L (ref 135–145)

## 2019-08-07 LAB — TROPONIN I (HIGH SENSITIVITY)
Troponin I (High Sensitivity): <2 ng/L (ref <18)
Troponin I (High Sensitivity): 3 ng/L (ref <18)

## 2019-08-07 LAB — T4, FREE: Free T4: 1.14 ng/dL — ABNORMAL HIGH (ref 0.61–1.12)

## 2019-08-07 LAB — TSH: TSH: <0.01 u[IU]/mL — ABNORMAL LOW (ref 0.350–4.500)

## 2019-08-07 MED ORDER — SODIUM CHLORIDE 0.9% FLUSH: 3.0000 mL | Freq: Once | INTRAVENOUS | Status: DC

## 2019-08-07 MED ORDER — METOPROLOL SUCCINATE ER 50 MG PO TB24: 50.0000 mg | ORAL_TABLET | Freq: Every day | ORAL | 0 refills | Status: DC

## 2019-08-07 NOTE — ED Provider Notes (Signed)
[facility] Emergency Department Provider Note  Time seen: 2:06 PM  I have reviewed the triage vital signs and the nursing notes.   HISTORY  Chief Complaint Palpitations   HPI Heather Cherry is a 34 y.o. female with a past medical history of anxiety, DVT, hypertension, presents to the emergency department for rapid heart rate and palpitations.  According to the patient approximately 1 hour prior to presentation she began feeling her heart racing, states she looked at her watch and it was reading 140 bpm.  Patient states she went outside to take her mask off and tried to relax.  Patient states it felt different than anxiety/panic attack.  Denies any chest pain at any point.  Did feel somewhat short of breath at that time but states that has since resolved.  Patient has a history of a pulmonary embolism takes Lovenox twice daily including today per patient.  During my evaluation patient is awake alert calm cooperative, no distress.  Heart rate currently around 80 bpm.   Past Medical History:  Diagnosis Date  . Abnormal vaginal bleeding   . Anxiety   . Depression   . Diabetes mellitus without complication ([place])   . DVT (deep venous thrombosis) ([place])   . Fibroid   . Hypertension   . Obesity   . Pulmonary emboli ([place])   . Sinus congestion     Patient Active Problem List   Diagnosis Date Noted  . Diabetes mellitus without complication ([place]) [id number]  . DVT (deep vein thrombosis) in pregnancy 03/26/2016  . Hypokalemia 03/26/2016  . Anemia 03/26/2016  . Morbid obesity ([place]) 03/26/2016  . Pulmonary embolism ([place]) 03/24/2016  . Pseudotumor cerebri 03/19/2016  . Pleuritic chest pain 03/18/2016  . Chest pain 03/18/2016  . Right pulmonary embolus ([place]) 03/17/2016  . PE (pulmonary thromboembolism) ([place]) 03/12/2016  . DVT (deep venous thrombosis) ([place]) 03/12/2016  . HTN (hypertension) 03/12/2016  . Depression 03/12/2016  . Anxiety 03/12/2016  . GERD  (gastroesophageal reflux disease) 03/12/2016  . Hyperglycemia 03/12/2016    Past Surgical History:  Procedure Laterality Date  . CHOLECYSTECTOMY    . COLONOSCOPY    . IVC FILTER REMOVAL N/A 04/29/2017   Procedure: IVC FILTER REMOVAL;  Surgeon: Algernon Huxley, MD;  Location: [facility];  Service: Cardiovascular;  Laterality: N/A;  . PERIPHERAL VASCULAR CATHETERIZATION N/A 03/25/2016   Procedure: IVC Filter Insertion;  Surgeon: Katha Cabal, MD;  Location: [facility];  Service: Cardiovascular;  Laterality: N/A;  . WISDOM TOOTH EXTRACTION      Prior to Admission medications   Medication Sig Start Date End Date Taking? Authorizing Provider  acetaminophen (TYLENOL) 500 MG tablet Take 1,000 mg by mouth every 6 (six) hours as needed for moderate pain or headache.     [provider]  acetaZOLAMIDE (DIAMOX) 250 MG tablet Take 500 mg by mouth 2 (two) times daily.    [provider]  albuterol (PROVENTIL HFA;VENTOLIN HFA) 108 (90 Base) MCG/ACT inhaler Inhale 2 puffs into the lungs every 6 (six) hours as needed for wheezing or shortness of breath. 03/29/17   Rudene Re, MD  amLODipine (NORVASC) 5 MG tablet Take 5 mg by mouth daily.    [provider]  cetirizine (ZYRTEC) 10 MG tablet Take 10 mg by mouth daily.    [provider]  enoxaparin (LOVENOX) 80 MG/0.8ML injection Inject 1.6 mLs (160 mg total) into the skin every 12 (twelve) hours. 03/26/16   Theodoro Grist, MD  ferrous sulfate 325 (65 FE) MG tablet Take 325 mg by mouth daily with breakfast.     [provider]  metFORMIN (GLUCOPHAGE-XR) 500 MG 24 hr tablet Take 1,000 mg by mouth daily with breakfast.    [provider]  metoprolol succinate (TOPROL-XL) 25 MG 24 hr tablet Take 25 mg by mouth daily.    [provider]  omeprazole (PRILOSEC) 20 MG capsule Take 20 mg by mouth daily.    [provider]  rizatriptan (MAXALT) 10 MG tablet Take 10  mg by mouth as needed for migraine. May repeat in 2 hours if needed    [provider]    Allergies  Allergen Reactions  . Lisinopril Cough    Family History  Problem Relation Age of Onset  . Cancer Paternal Grandfather   . Rheumatologic disease Neg Hx     Social History Social History   Tobacco Use  . Smoking status: Never Smoker  . Smokeless tobacco: Never Used  Substance Use Topics  . Alcohol use: Yes    Comment: occas. social  . Drug use: No    Review of Systems Constitutional: Negative for fever. Cardiovascular: Negative for chest pain.  Positive for palpitations/heart racing Respiratory: Mild shortness of breath which is since resolved Gastrointestinal: Negative for abdominal pain, vomiting Musculoskeletal: Negative for musculoskeletal complaints Neurological: Negative for headache All other ROS negative  ____________________________________________   PHYSICAL EXAM:  VITAL SIGNS: ED Triage Vitals  Enc Vitals Group     BP 08/07/19 1047 (!) 158/91     Pulse Rate 08/07/19 1047 (!) 108     Resp 08/07/19 1047 (!) 24     Temp 08/07/19 1047 98.3 F (36.8 C)     Temp Source 08/07/19 1047 Oral     SpO2 08/07/19 1047 98 %     Weight 08/07/19 1049 (!) 338 lb (153.3 kg)     Height 08/07/19 1049 5\' 9"  (1.753 m)     Head Circumference --      Peak Flow --      Pain Score 08/07/19 1049 0     Pain Loc --      Pain Edu? --      Excl. in [place]? --     Constitutional: Alert and oriented. Well appearing and in no distress. Eyes: Normal exam ENT      Head: Normocephalic and atraumatic.      Mouth/Throat: Mucous membranes are moist. Cardiovascular: Normal rate, regular rhythm.  Respiratory: Normal respiratory effort without tachypnea nor retractions. Breath sounds are clear Gastrointestinal: Soft and nontender. No distention.   Musculoskeletal: Nontender with normal range of motion in all extremities.  Neurologic:  Normal speech and language. No gross focal  neurologic deficits  Skin:  Skin is warm, dry and intact.  Psychiatric: Mood and affect are normal.   ____________________________________________    EKG  EKG viewed and interpreted by myself shows a sinus tachycardia 101 bpm with a narrow QRS, normal axis, normal intervals, no concerning ST changes.     INITIAL IMPRESSION / ASSESSMENT AND PLAN / ED COURSE  Pertinent labs & imaging results that were available during my care of the patient were reviewed by me and considered in my medical decision making (see chart for details).   Patient presents to the emergency department for palpitations/heart racing which is since resolved.  Patient states she is feeling better.  Not having palpitations.  Normal heart rate around 80 bpm.  Clear lung sounds.  Patient just  had a chest x-ray and is requesting not to repeat it.  I do not feel the patient requires a repeat chest x-ray at this time.  I have added on a TSH as a precaution.  Patient has a cardiologist and is currently undergoing a work-up for possible pulmonary hypertension.  If the patient's TSH is normal anticipate likely discharge home with cardiology follow-up to discuss possible Holter monitor.  The remainder the patient's work-up including cardiac enzymes are reassuring.  Patient's TSH has resulted undetectable.  We will check a free T3-T4 level.  Could possibly be related to the patient's symptoms of intermittent palpitations, also has significant anxiety per patient.  Vitals are reassuring at this time.  Patient care signed out to oncoming physician, thyroid studies pending.  Indria Dood was evaluated in Emergency Department on 08/07/2019 for the symptoms described in the history of present illness. She was evaluated in the context of the global COVID-19 pandemic, which necessitated consideration that the patient might be at risk for infection with the SARS-CoV-2 virus that causes COVID-19. Institutional protocols and algorithms that pertain  to the evaluation of patients at risk for COVID-19 are in a state of rapid change based on information released by regulatory bodies including the CDC and federal and state organizations. These policies and algorithms were followed during the patient's care in the ED.  ____________________________________________   FINAL CLINICAL IMPRESSION(S) / ED DIAGNOSES  Palpitations Tachycardia   Harvest Dark, MD 08/07/19 1501

## 2019-08-07 NOTE — ED Notes (Signed)
See triage note, pt reports coming to ED for "fast heart rate", max 130. Denies CP or SHOB. Reports dizziness at this time.

## 2019-08-07 NOTE — ED Triage Notes (Signed)
Fast heart rate starting while at work, fitbit was reading around 140 bpm.  Feels tired, got clammy and weak.  Takes lovenox for blood clots, gave self lovenox 160mg  on arrival to ED.

## 2019-08-07 NOTE — ED Provider Notes (Signed)
Patient resting quite comfortably.  Heart rate 100.  Still somewhat hypertensive.  Her free T4 is resulted minimally elevated.  TSH low.  Suspicion for hyperthyroidism.  In discussing with the patient she does have heavy menstrual cycles, occasional feelings of anxiety, not having some occasional palpitations.  Resting comfortably, she reports that she has good contact with her primary care doctor and was actually planning to see them today, but should be able to reschedule for a visit next 1 to 2 days.  At this point I do not believe the patient requires inpatient evaluation, but would definitely recommend and did recommend appointment with her primary care for further work-up of possible hyperthyroidism.  Patient fully alert, no distress, no resting tremor, normal level of alertness, no signs or symptoms of acute thyrotoxicosis storm. Afebrile. No altered mental status. Tachycarida has resolved.   Vitals:   08/07/19 1047 08/07/19 1257  BP: (!) 158/91 (!) 144/80  Pulse: (!) 108 86  Resp: (!) 24 20  Temp: 98.3 F (36.8 C) 97.9 F (36.6 C)  SpO2: 98% 100%   Return precautions and treatment recommendations and follow-up discussed with the patient who is agreeable with the plan.  Discussed with patient, we will increase her Toprol-XL dose to 50 mg daily.  Patient understanding this plan and will disregard her old prescription.  Close follow-up through primary for further work-up discussed with patient is in agreement.  Also discussed return precautions with her and her mother.   Delman Kitten, MD 08/07/19 [id number]

## 2019-08-07 NOTE — ED Notes (Signed)
Pt c.o intermittent left sided CP. Denies SHOB or dizziness at this time. Dr Kerman Passey aware

## 2019-08-07 NOTE — ED Triage Notes (Signed)
First nurse note- here for CP/pulled for ekg. Ambulatory. NAD at this time.

## 2019-08-07 NOTE — ED Notes (Signed)
Pt signed paper copy for d/c. Denies questions or concerns at this time

## 2019-08-07 NOTE — ED Triage Notes (Signed)
Patient states just had a chest xray with [facility] and would prefer to wait on chest xray until sees a provider

## 2019-08-08 LAB — T3, FREE: T3, Free: 5.6 pg/mL — ABNORMAL HIGH (ref 2.0–4.4)

## 2019-11-17 ENCOUNTER — Emergency Department: Payer: BC Managed Care – PPO

## 2019-11-17 ENCOUNTER — Encounter: Payer: Self-pay | Admitting: Emergency Medicine

## 2019-11-17 ENCOUNTER — Emergency Department
Admission: EM | Admit: 2019-11-17 | Discharge: 2019-11-17 | Disposition: A | Discharge status: Home / Self Care | Payer: BC Managed Care – PPO | Attending: Emergency Medicine | Admitting: Emergency Medicine

## 2019-11-17 ENCOUNTER — Other Ambulatory Visit: Payer: Self-pay

## 2019-11-17 DIAGNOSIS — R079 Chest pain, unspecified: Secondary | ICD-10-CM | POA: Diagnosis present

## 2019-11-17 DIAGNOSIS — M549 Dorsalgia, unspecified: Secondary | ICD-10-CM | POA: Insufficient documentation

## 2019-11-17 DIAGNOSIS — Z5321 Procedure and treatment not carried out due to patient leaving prior to being seen by health care provider: Secondary | ICD-10-CM | POA: Diagnosis not present

## 2019-11-17 LAB — CBC
HCT: 38.3 % (ref 36.0–46.0)
Hemoglobin: 11.9 g/dL — ABNORMAL LOW (ref 12.0–15.0)
MCH: 25.2 pg — ABNORMAL LOW (ref 26.0–34.0)
MCHC: 31.1 g/dL (ref 30.0–36.0)
MCV: 81.1 fL (ref 80.0–100.0)
Platelets: 280 10*3/uL (ref 150–400)
RBC: 4.72 MIL/uL (ref 3.87–5.11)
RDW: 14.6 % (ref 11.5–15.5)
WBC: 6.6 10*3/uL (ref 4.0–10.5)
nRBC: 0 % (ref 0.0–0.2)

## 2019-11-17 LAB — BASIC METABOLIC PANEL
Anion gap: 7 (ref 5–15)
BUN: 14 mg/dL (ref 6–20)
CO2: 26 mmol/L (ref 22–32)
Calcium: 8.7 mg/dL — ABNORMAL LOW (ref 8.9–10.3)
Chloride: 109 mmol/L (ref 98–111)
Creatinine, Ser: 0.6 mg/dL (ref 0.44–1.00)
GFR calc Af Amer: >60 mL/min (ref >60)
GFR calc non Af Amer: >60 mL/min (ref >60)
Glucose, Bld: 113 mg/dL — ABNORMAL HIGH (ref 70–99)
Potassium: 3.7 mmol/L (ref 3.5–5.1)
Sodium: 142 mmol/L (ref 135–145)

## 2019-11-17 LAB — TROPONIN I (HIGH SENSITIVITY): Troponin I (High Sensitivity): 3 ng/L (ref <18)

## 2019-11-17 NOTE — ED Triage Notes (Signed)
Mid chest pain that is constant since lunch time.  Hx of PE but reports this does not feel similar.  Pain also in back. No cough or fever. Unlabored. Is on lovenox. No leg pain.

## 2020-07-21 ENCOUNTER — Encounter: Payer: Self-pay | Admitting: Emergency Medicine

## 2020-07-21 ENCOUNTER — Emergency Department: Payer: BC Managed Care – PPO

## 2020-07-21 ENCOUNTER — Other Ambulatory Visit: Payer: Self-pay

## 2020-07-21 ENCOUNTER — Emergency Department
Admission: EM | Admit: 2020-07-21 | Discharge: 2020-07-21 | Disposition: A | Discharge status: Home / Self Care | Payer: BC Managed Care – PPO | Attending: Emergency Medicine | Admitting: Emergency Medicine

## 2020-07-21 DIAGNOSIS — Z7984 Long term (current) use of oral hypoglycemic drugs: Secondary | ICD-10-CM | POA: Insufficient documentation

## 2020-07-21 DIAGNOSIS — Z79899 Other long term (current) drug therapy: Secondary | ICD-10-CM | POA: Insufficient documentation

## 2020-07-21 DIAGNOSIS — R5383 Other fatigue: Secondary | ICD-10-CM | POA: Diagnosis present

## 2020-07-21 DIAGNOSIS — D649 Anemia, unspecified: Secondary | ICD-10-CM | POA: Diagnosis not present

## 2020-07-21 DIAGNOSIS — N939 Abnormal uterine and vaginal bleeding, unspecified: Secondary | ICD-10-CM | POA: Insufficient documentation

## 2020-07-21 DIAGNOSIS — R002 Palpitations: Secondary | ICD-10-CM | POA: Diagnosis not present

## 2020-07-21 DIAGNOSIS — R0602 Shortness of breath: Secondary | ICD-10-CM | POA: Diagnosis not present

## 2020-07-21 DIAGNOSIS — Z20822 Contact with and (suspected) exposure to covid-19: Secondary | ICD-10-CM | POA: Diagnosis not present

## 2020-07-21 DIAGNOSIS — E119 Type 2 diabetes mellitus without complications: Secondary | ICD-10-CM | POA: Insufficient documentation

## 2020-07-21 DIAGNOSIS — I10 Essential (primary) hypertension: Secondary | ICD-10-CM | POA: Diagnosis not present

## 2020-07-21 LAB — CBC WITH DIFFERENTIAL/PLATELET
Abs Immature Granulocytes: 0.07 10*3/uL (ref 0.00–0.07)
Basophils Absolute: 0 10*3/uL (ref 0.0–0.1)
Basophils Relative: 0 %
Eosinophils Absolute: 0 10*3/uL (ref 0.0–0.5)
Eosinophils Relative: 0 %
HCT: 28.7 % — ABNORMAL LOW (ref 36.0–46.0)
Hemoglobin: 8.9 g/dL — ABNORMAL LOW (ref 12.0–15.0)
Immature Granulocytes: 1 %
Lymphocytes Relative: 19 %
Lymphs Abs: 1.9 10*3/uL (ref 0.7–4.0)
MCH: 23.9 pg — ABNORMAL LOW (ref 26.0–34.0)
MCHC: 31 g/dL (ref 30.0–36.0)
MCV: 76.9 fL — ABNORMAL LOW (ref 80.0–100.0)
Monocytes Absolute: 0.7 10*3/uL (ref 0.1–1.0)
Monocytes Relative: 7 %
Neutro Abs: 7.4 10*3/uL (ref 1.7–7.7)
Neutrophils Relative %: 73 %
Platelets: 376 10*3/uL (ref 150–400)
RBC: 3.73 MIL/uL — ABNORMAL LOW (ref 3.87–5.11)
RDW: 15.5 % (ref 11.5–15.5)
WBC: 10.2 10*3/uL (ref 4.0–10.5)
nRBC: 0 % (ref 0.0–0.2)

## 2020-07-21 LAB — COMPREHENSIVE METABOLIC PANEL
ALT: 53 U/L — ABNORMAL HIGH (ref 0–44)
AST: 20 U/L (ref 15–41)
Albumin: 3.1 g/dL — ABNORMAL LOW (ref 3.5–5.0)
Alkaline Phosphatase: 92 U/L (ref 38–126)
Anion gap: 10 (ref 5–15)
BUN: 7 mg/dL (ref 6–20)
CO2: 23 mmol/L (ref 22–32)
Calcium: 8.6 mg/dL — ABNORMAL LOW (ref 8.9–10.3)
Chloride: 104 mmol/L (ref 98–111)
Creatinine, Ser: 0.67 mg/dL (ref 0.44–1.00)
GFR, Estimated: >60 mL/min (ref >60)
Glucose, Bld: 174 mg/dL — ABNORMAL HIGH (ref 70–99)
Potassium: 3.4 mmol/L — ABNORMAL LOW (ref 3.5–5.1)
Sodium: 137 mmol/L (ref 135–145)
Total Bilirubin: 0.5 mg/dL (ref 0.3–1.2)
Total Protein: 7.5 g/dL (ref 6.5–8.1)

## 2020-07-21 LAB — TYPE AND SCREEN
ABO/RH(D): B POS
Antibody Screen: NEGATIVE

## 2020-07-21 LAB — URINALYSIS, COMPLETE (UACMP) WITH MICROSCOPIC
Bacteria, UA: NONE SEEN
Bilirubin Urine: NEGATIVE
Glucose, UA: NEGATIVE mg/dL
Ketones, ur: NEGATIVE mg/dL
Leukocytes,Ua: NEGATIVE
Nitrite: NEGATIVE
Protein, ur: 100 mg/dL — AB
RBC / HPF: >50 RBC/hpf — ABNORMAL HIGH (ref 0–5)
Specific Gravity, Urine: >1.046 — ABNORMAL HIGH (ref 1.005–1.030)
pH: 9 — ABNORMAL HIGH (ref 5.0–8.0)

## 2020-07-21 LAB — T4, FREE: Free T4: 1.04 ng/dL (ref 0.61–1.12)

## 2020-07-21 LAB — TSH: TSH: 0.038 u[IU]/mL — ABNORMAL LOW (ref 0.350–4.500)

## 2020-07-21 LAB — TROPONIN I (HIGH SENSITIVITY): Troponin I (High Sensitivity): 8 ng/L (ref <18)

## 2020-07-21 LAB — RESP PANEL BY RT-PCR (FLU A&B, COVID) ARPGX2
Influenza A by PCR: NEGATIVE
Influenza B by PCR: NEGATIVE
SARS Coronavirus 2 by RT PCR: NEGATIVE

## 2020-07-21 IMAGING — CT CT ANGIO CHEST
2 of 6 series · 18 of 46 positions shown · IV contrast (APPLIED)
Comparison: None.

CLINICAL DATA: 34-year-old female with history of anemia and chest
pain.

EXAM:
CT ANGIOGRAPHY CHEST WITH CONTRAST
TECHNIQUE: Multidetector CT imaging of the chest was performed using the
standard protocol during bolus administration of intravenous
contrast. Multiplanar CT image reconstructions and MIPs were
obtained to evaluate the vascular anatomy.
CONTRAST:  Seventy-five mL Omnipaque 350, intravenous

[Series 5: thins · axial · 0.65mm/px · z∈[-542,-318]mm · 15 of 246 slices shown]
[im 11/246  lung]
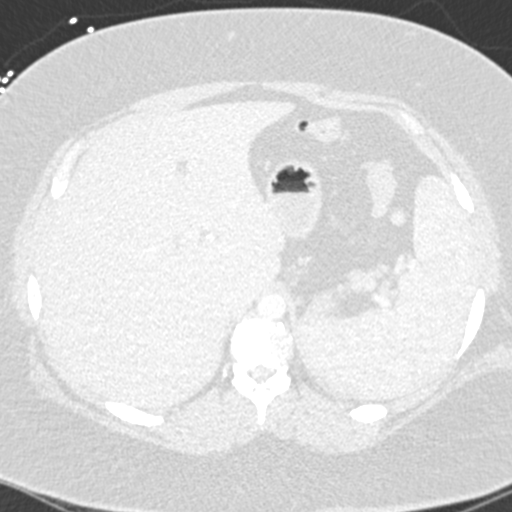
[im 32/246  soft-tissue]
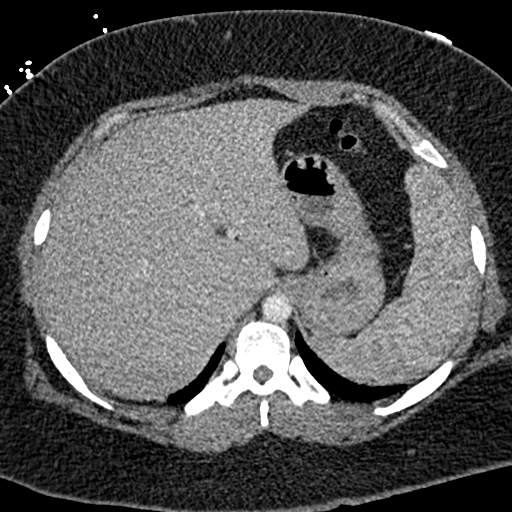
[im 43/246  lung]
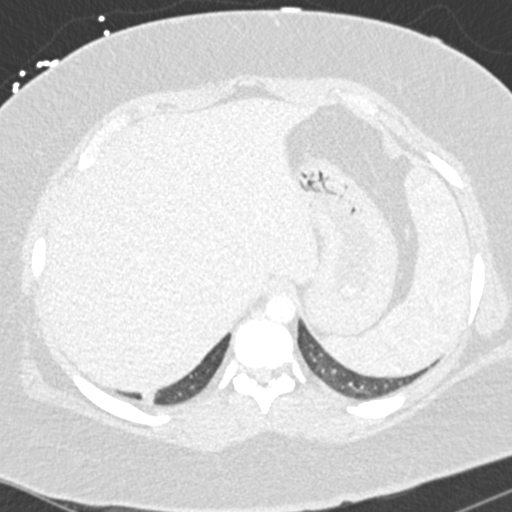
[im 64/246  soft-tissue]
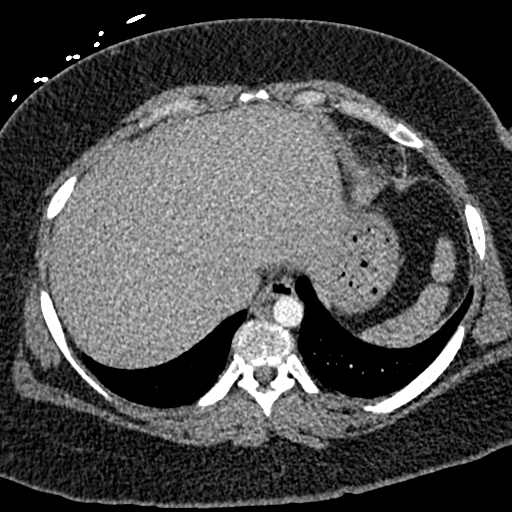
[im 75/246  lung]
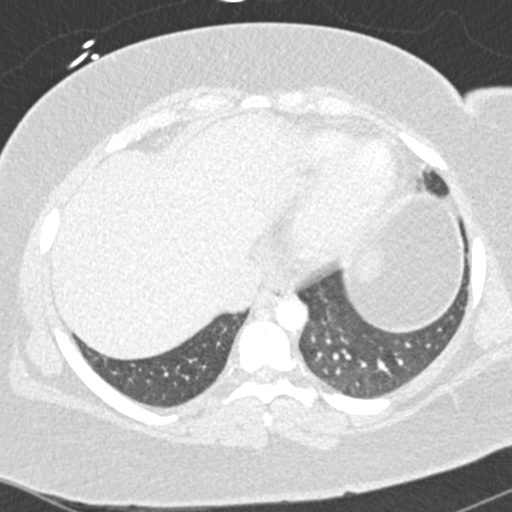
[im 96/246  soft-tissue]
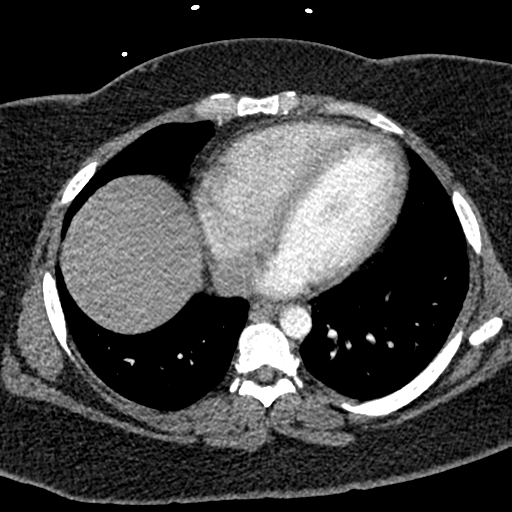
[im 107/246  lung]
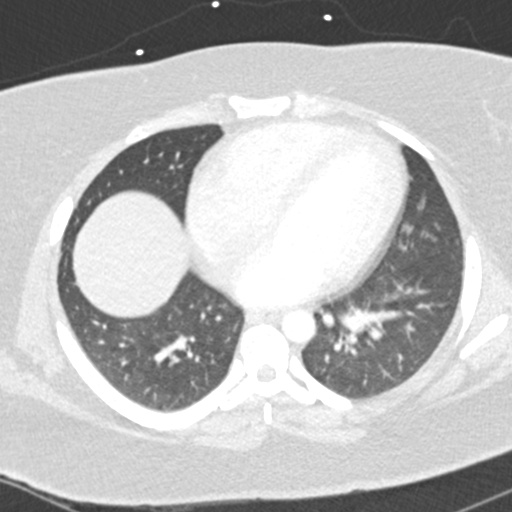
[im 128/246  soft-tissue]
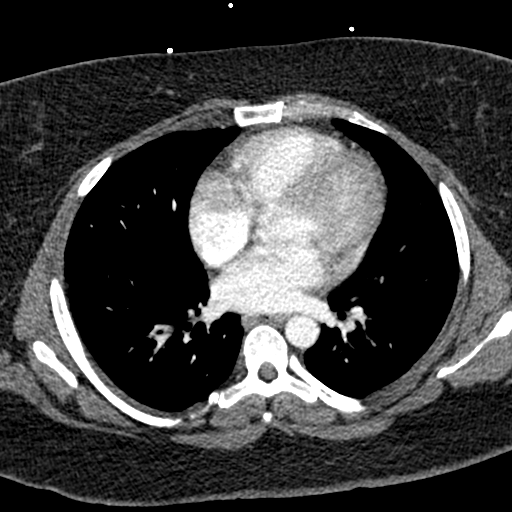
[im 139/246  lung]
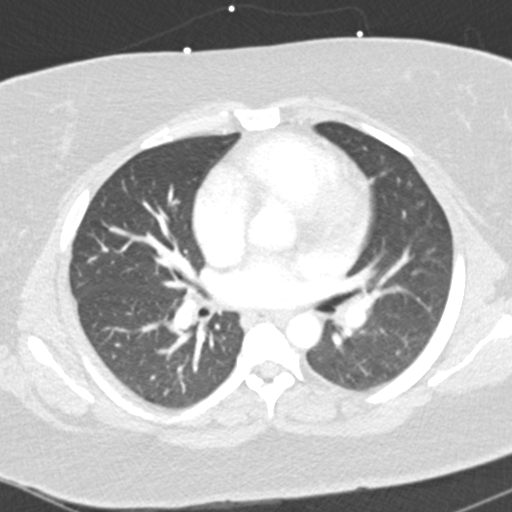
[im 150/246  soft-tissue]
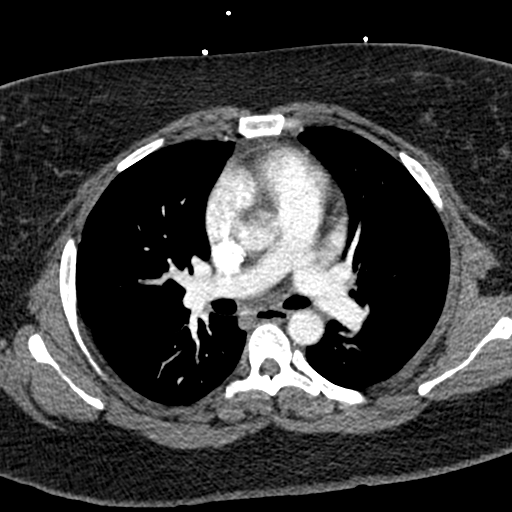
[im 171/246  lung]
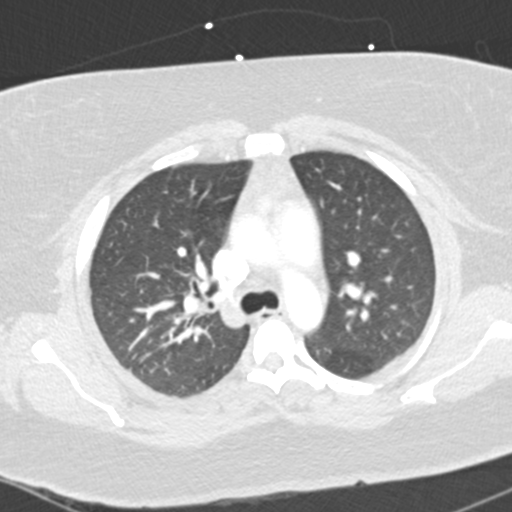
[im 182/246  soft-tissue]
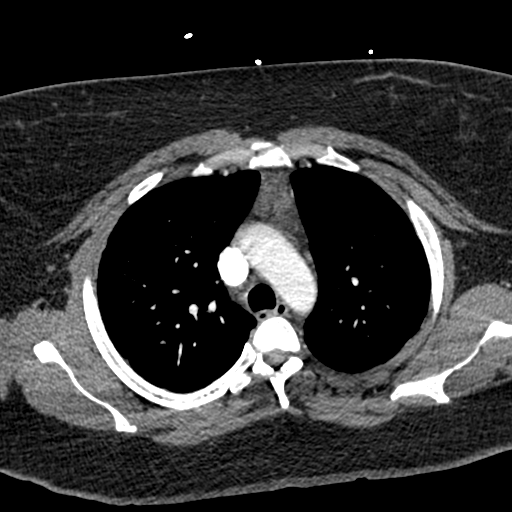
[im 203/246  lung]
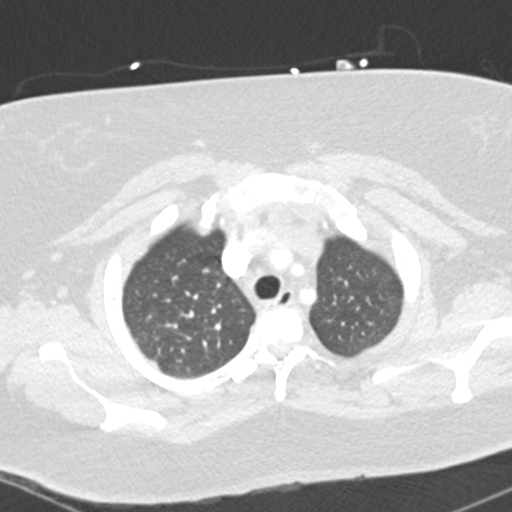
[im 214/246  soft-tissue]
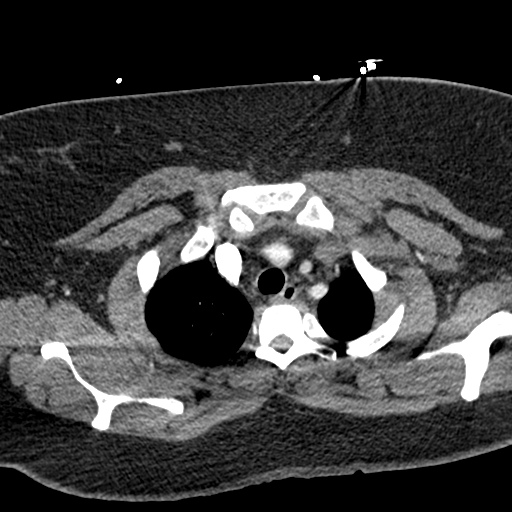
[im 235/246  lung]
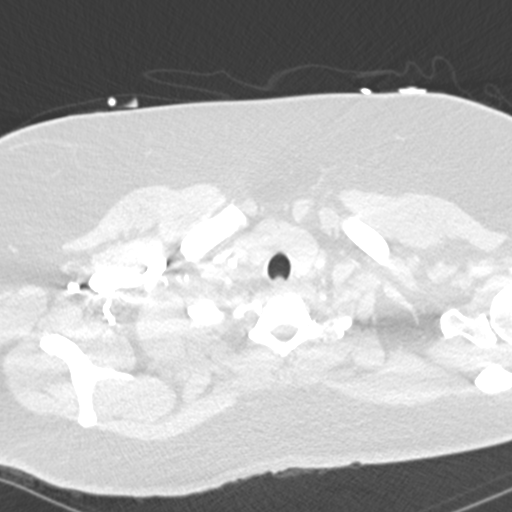

[Series 7: coronal mpr · coronal · 0.49mm/px · 3 of 106 slices shown]
[im 27/106  soft-tissue]
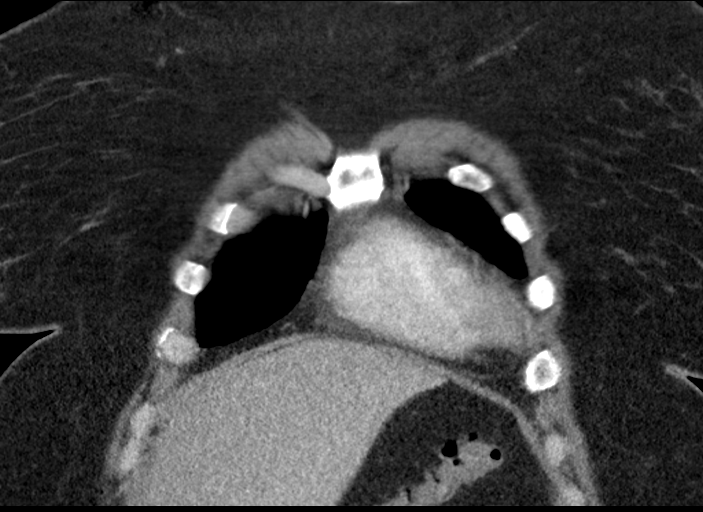
[im 53/106  soft-tissue]
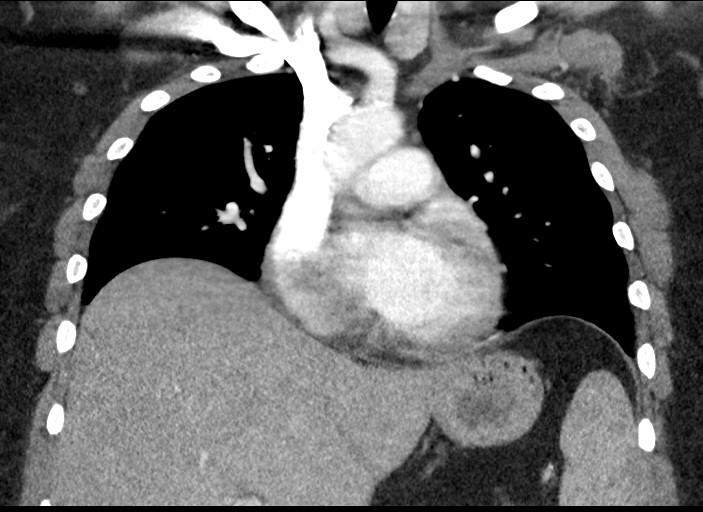
[im 79/106  soft-tissue]
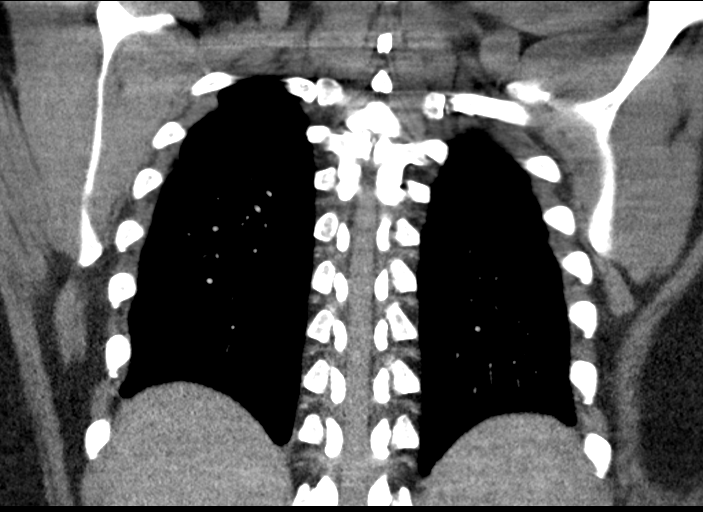

[18 of 46 positions shown; findings below may reference images not displayed]

FINDINGS: Cardiovascular: Satisfactory opacification of the pulmonary arteries
to the segmental level. No evidence of pulmonary embolism. Similar
mild global cardiomegaly. No pericardial effusion.

Mediastinum/Nodes: No enlarged mediastinal, hilar, or axillary lymph
nodes. Thyroid gland, trachea, and esophagus demonstrate no
significant findings.

Lungs/Pleura: Decreased conspicuity of previously visualized nodular
opacity in the subpleural right lower lobe. No focal consolidations.
No suspicious pulmonary nodules. No pleural effusion or
pneumothorax.

Upper Abdomen: The visualized upper abdomen is within normal limits.

Musculoskeletal: No chest wall abnormality. No acute or significant
osseous findings.

Review of the MIP images confirms the above findings.
IMPRESSION: Vascular:

1. No evidence of pulmonary embolism.
2. Similar mild cardiomegaly.

Non-Vascular:

No acute intrathoracic abnormality.

## 2020-07-21 MED ORDER — ENOXAPARIN SODIUM 300 MG/3ML IJ SOLN
160.0000 mg | Freq: Once | INTRAMUSCULAR | Status: AC
  Administered 2020-07-21: 160 mg via SUBCUTANEOUS
  Filled 2020-07-21 (×2): qty 1.6

## 2020-07-21 MED ORDER — OXYCODONE-ACETAMINOPHEN 5-325 MG PO TABS
1.0000 | ORAL_TABLET | Freq: Once | ORAL | Status: AC
  Administered 2020-07-21: 1 via ORAL
  Filled 2020-07-21: qty 1

## 2020-07-21 MED ORDER — SODIUM CHLORIDE 0.9 % IV BOLUS
1000.0000 mL | Freq: Once | INTRAVENOUS | Status: AC
  Administered 2020-07-21: 1000 mL via INTRAVENOUS

## 2020-07-21 MED ORDER — IOHEXOL 350 MG/ML SOLN
75.0000 mL | Freq: Once | INTRAVENOUS | Status: AC | PRN
  Administered 2020-07-21: 75 mL via INTRAVENOUS

## 2020-07-21 MED ORDER — MEDROXYPROGESTERONE ACETATE 10 MG PO TABS: 20.0000 mg | ORAL_TABLET | Freq: Two times a day (BID) | ORAL | 0 refills | Status: DC

## 2020-07-21 MED ORDER — OXYCODONE-ACETAMINOPHEN 5-325 MG PO TABS: 1.0000 | ORAL_TABLET | ORAL | 0 refills | Status: DC | PRN

## 2020-07-21 NOTE — ED Provider Notes (Signed)
[facility] Emergency Department Provider Note ____________________________________________   Event Date/Time   First MD Initiated Contact with Patient 07/21/20 1011     (approximate)  I have reviewed the triage vital signs and the nursing notes.   HISTORY  Chief Complaint Irregular Heart Beat, Abdominal Pain, Weakness, and Vaginal Bleeding    HPI Heather Cherry is a 35 y.o. female with PMH as noted below including diabetes, DVT and PE (on Lovenox) and hypothyroidism who presents with generalized fatigue and weakness over the last several weeks, gradual onset, worsening course.  The patient states that she felt worse than usual today when taking a shower.  She has had palpitations, shortness of breath, and increased generalized weakness.  The patient reports a chronic history of heavy vaginal bleeding due to fibroids.  She was started on norethindrone by her OB/GYN about a month ago but it has not helped.  The patient self discontinued a few days ago and the bleeding increased.  She denies any other abnormal bleeding or bruising.  She reports that she has been compliant with her Lovenox.   Past Medical History:  Diagnosis Date  . Abnormal vaginal bleeding   . Anxiety   . Depression   . Diabetes mellitus without complication ([place])   . DVT (deep venous thrombosis) ([place])   . Fibroid   . Hypertension   . Obesity   . Pulmonary emboli ([place])   . Sinus congestion     Patient Active Problem List   Diagnosis Date Noted  . Diabetes mellitus without complication ([place]) 14/78/2956  . DVT (deep vein thrombosis) in pregnancy 03/26/2016  . Hypokalemia 03/26/2016  . Anemia 03/26/2016  . Morbid obesity ([place]) 03/26/2016  . Pulmonary embolism ([place]) 03/24/2016  . Pseudotumor cerebri 03/19/2016  . Pleuritic chest pain 03/18/2016  . Chest pain 03/18/2016  . Right pulmonary embolus ([place]) 03/17/2016  . PE (pulmonary thromboembolism) ([place]) 03/12/2016  . DVT (deep venous  thrombosis) ([place]) 03/12/2016  . HTN (hypertension) 03/12/2016  . Depression 03/12/2016  . Anxiety 03/12/2016  . GERD (gastroesophageal reflux disease) 03/12/2016  . Hyperglycemia 03/12/2016    Past Surgical History:  Procedure Laterality Date  . CHOLECYSTECTOMY    . COLONOSCOPY    . IVC FILTER REMOVAL N/A 04/29/2017   Procedure: IVC FILTER REMOVAL;  Surgeon: Algernon Huxley, MD;  Location: [facility] [facility];  Service: Cardiovascular;  Laterality: N/A;  . PERIPHERAL VASCULAR CATHETERIZATION N/A 03/25/2016   Procedure: IVC Filter Insertion;  Surgeon: Katha Cabal, MD;  Location: [facility];  Service: Cardiovascular;  Laterality: N/A;  . WISDOM TOOTH EXTRACTION      Prior to Admission medications   Medication Sig Start Date End Date Taking? Authorizing Provider  acetaminophen (TYLENOL) 500 MG tablet Take 1,000 mg by mouth every 6 (six) hours as needed for moderate pain or headache.    Yes [provider]  acetaZOLAMIDE (DIAMOX) 250 MG tablet Take 250 mg by mouth daily.   Yes [provider]  amLODipine (NORVASC) 5 MG tablet Take 5 mg by mouth daily.   Yes [provider]  DULoxetine (CYMBALTA) 30 MG capsule Take 30 mg by mouth daily.   Yes [provider]  enoxaparin (LOVENOX) 80 MG/0.8ML injection Inject 160 mg into the skin every 12 (twelve) hours.   Yes [provider]  ferrous sulfate 325 (65 FE) MG tablet Take 325 mg by mouth 3 (three) times daily with meals.   Yes [provider]  medroxyPROGESTERone (PROVERA) 10 MG tablet Take 2 tablets (20 mg total) by mouth 2 (two) times daily for 7 days. 07/21/20 07/28/20 Yes Arta Silence, MD  metFORMIN (GLUCOPHAGE-XR) 500 MG 24 hr tablet Take 500 mg by mouth daily with breakfast.   Yes [provider]  methimazole (TAPAZOLE) 10 MG tablet Take 10-20 mg by mouth See admin instructions. Take 2 tablets (20mg ) by mouth every morning and take 1 tablet (10mg ) by mouth  every evening   Yes [provider]  metoprolol succinate (TOPROL-XL) 100 MG 24 hr tablet Take 100 mg by mouth daily.   Yes [provider]  omeprazole (PRILOSEC) 20 MG capsule Take 20 mg by mouth daily.   Yes [provider]  oxyCODONE-acetaminophen (PERCOCET) 5-325 MG tablet Take 1 tablet by mouth every 4 (four) hours as needed for severe pain. 07/21/20 07/21/21 Yes Arta Silence, MD  rizatriptan (MAXALT) 10 MG tablet Take 10 mg by mouth as needed for migraine. May repeat in 2 hours if needed   Yes [provider]    Allergies Lisinopril  Family History  Problem Relation Age of Onset  . Cancer Paternal Grandfather   . Rheumatologic disease Neg Hx     Social History Social History   Tobacco Use  . Smoking status: Never Smoker  . Smokeless tobacco: Never Used  Vaping Use  . Vaping Use: Never used  Substance Use Topics  . Alcohol use: Yes    Comment: occas. social  . Drug use: No    Review of Systems  Constitutional: No fever.  Positive generalized weakness. Eyes: No redness. ENT: No sore throat. Cardiovascular: Denies chest pain. Respiratory: Positive for shortness of breath. Gastrointestinal: No vomiting or diarrhea Genitourinary: Negative for dysuria.  Musculoskeletal: Negative for back pain. Skin: Negative for rash. Neurological: Negative for headaches, focal weakness or numbness.   ____________________________________________   PHYSICAL EXAM:  VITAL SIGNS: ED Triage Vitals  Enc Vitals Group     BP 07/21/20 1003 113/74     Pulse Rate 07/21/20 1003 (!) 122     Resp 07/21/20 1003 16     Temp 07/21/20 1003 99.3 F (37.4 C)     Temp Source 07/21/20 1003 Oral     SpO2 07/21/20 1003 100 %     Weight 07/21/20 1003 (!) 367 lb (166.5 kg)     Height 07/21/20 1003 5\' 9"  (1.753 m)     Head Circumference --      Peak Flow --      Pain Score 07/21/20 0956 6     Pain Loc --      Pain Edu? --      Excl. in [place]? --      Constitutional: Alert and oriented.  Relatively well appearing, in no acute distress. Eyes: Conjunctivae are somewhat pale. Head: Atraumatic. Nose: No congestion/rhinnorhea. Mouth/Throat: Mucous membranes are moist.   Neck: Normal range of motion.  Cardiovascular: Tachycardic, regular rhythm. Grossly normal heart sounds.  Good peripheral circulation. Respiratory: Normal respiratory effort.  No retractions. Lungs CTAB. Gastrointestinal: No distention.  Musculoskeletal: No lower extremity edema.  Extremities warm and well perfused.  Neurologic:  Normal speech and language. No gross focal neurologic deficits are appreciated.  Skin:  Skin is warm and dry. No rash noted. Psychiatric: Mood and affect are normal. Speech and behavior are normal.  ____________________________________________   LABS (all labs ordered are listed, but only abnormal results are displayed)  Labs Reviewed  CBC WITH DIFFERENTIAL/PLATELET - Abnormal; Notable for the  following components:      Result Value   RBC 3.73 (*)    Hemoglobin 8.9 (*)    HCT 28.7 (*)    MCV 76.9 (*)    MCH 23.9 (*)    All other components within normal limits  COMPREHENSIVE METABOLIC PANEL - Abnormal; Notable for the following components:   Potassium 3.4 (*)    Glucose, Bld 174 (*)    Calcium 8.6 (*)    Albumin 3.1 (*)    ALT 53 (*)    All other components within normal limits  TSH - Abnormal; Notable for the following components:   TSH 0.038 (*)    All other components within normal limits  URINALYSIS, COMPLETE (UACMP) WITH MICROSCOPIC - Abnormal; Notable for the following components:   Color, Urine YELLOW (*)    APPearance CLEAR (*)    Specific Gravity, Urine >1.046 (*)    pH 9.0 (*)    Hgb urine dipstick LARGE (*)    Protein, ur 100 (*)    RBC / HPF >50 (*)    All other components within normal limits  RESP PANEL BY RT-PCR (FLU A&B, COVID) ARPGX2  T4, FREE  TYPE AND SCREEN  TROPONIN I (HIGH SENSITIVITY)    ____________________________________________  EKG  ED ECG REPORT I, Arta Silence, the attending physician, personally viewed and interpreted this ECG.  Date: 07/21/2020 EKG Time: 1007 Rate: 112 Rhythm: Sinus tachycardia QRS Axis: normal Intervals: Prolonged QTc ST/T Wave abnormalities: normal Narrative Interpretation: no evidence of acute ischemia  ____________________________________________  RADIOLOGY  CT angio chest: No acute PE or other acute abnormality  ____________________________________________   PROCEDURES  Procedure(s) performed: No  Procedures  Critical Care performed: No ____________________________________________   INITIAL IMPRESSION / ASSESSMENT AND PLAN / ED COURSE  Pertinent labs & imaging results that were available during my care of the patient were reviewed by me and considered in my medical decision making (see chart for details).  35 year old female with PMH as noted above including history of DVT with PE (on Xarelto), uterine fibroids, anemia, diabetes, and hypothyroidism presents with gradual onset generalized weakness and fatigue over the last several weeks which she associates with persistent heavy vaginal bleeding.  She has been seen by OB/GYN and was started on medication about a month ago which did not help.  Today her symptoms worsened and she also had some shortness of breath and palpitations.  I reviewed the past medical records in [facility] and [place].  The patient was last seen in the ED here a year ago with tachycardia and palpitations and was found to be hypothyroid at that time.  She recently had an outpatient visit on 4/8 for fever for several days with no localized symptoms.  She had endoscopy earlier this month.  On exam, the patient is overall well-appearing.  Her vital signs are normal except for tachycardia and borderline elevated temperature.  The physical exam is otherwise unremarkable.  As the vaginal bleeding is  a subacute to chronic problem and she has followed with OB/GYN, there is no indication for repeat pelvic exam today.  Differential includes anemia due to chronic blood loss, dehydration, other metabolic etiology, hypothyroidism or other endocrine etiology, or possible recurrent PE given the tachycardia, palpitations, and shortness of breath.  This is somewhat less likely given that the patient is already on Lovenox and appears to be compliant.  We will obtain labs and a CT angio of the chest for further evaluation.  ----------------------------------------- 2:17 PM on  07/21/2020 -----------------------------------------  The patient is more anemic than baseline, with a hemoglobin of 8.9 (down from 11 eight months ago), but not low enough to warrant urgent transfusion.  The basic labs are otherwise reassuring.  Troponin is negative, as is the Covid swab.  The patient's TSH is low but T4 is normal so there is no evidence of significant hypothyroidism causing her symptoms.  The CT chest is negative for PE and shows no other significant findings.    Covid test is negative.  The patient has been afebrile here and lab work-up does not reveal any source of infection.  Urinalysis shows some RBCs consistent with contamination from vaginal bleeding, however there is no evidence of UTI.  The patient's heart rate has improved after fluids and is now in the 90s.  Her other vital signs are stable.  She is reporting some increased lower abdominal cramping consistent with period cramps.  I have ordered Percocet which the patient reports this worked well for her before.  Since the patient had no improvement with norethindrone, I want to start her on an alternative to help decrease the bleeding until she can follow-up with her OB/GYN.  I consulted Dr. Glennon Mac from OB/GYN who recommended a 1 week course of 20 mg Provera twice daily (which he advised should not increase the patient's PE risk especially since she is  on a treatment dose of Lovenox).  On reassessment, the patient overall appears comfortable.  I counseled her on the results of the work-up and the medication plan and she agrees.  She agrees to call her OB/GYN at [facility] for follow-up this week.  I gave her thorough return precautions and she expressed understanding.   ____________________________________________   FINAL CLINICAL IMPRESSION(S) / ED DIAGNOSES  Final diagnoses:  Abnormal uterine bleeding  Anemia, unspecified type      NEW MEDICATIONS STARTED DURING THIS VISIT:  New Prescriptions   MEDROXYPROGESTERONE (PROVERA) 10 MG TABLET    Take 2 tablets (20 mg total) by mouth 2 (two) times daily for 7 days.   OXYCODONE-ACETAMINOPHEN (PERCOCET) 5-325 MG TABLET    Take 1 tablet by mouth every 4 (four) hours as needed for severe pain.     Note:  This document was prepared using Dragon voice recognition software and may include unintentional dictation errors.   Arta Silence, MD 07/21/20 1423

## 2020-07-21 NOTE — Discharge Instructions (Addendum)
Take the Provera as prescribed, twice daily over the next week.  Call your OB/GYN tomorrow to arrange for follow-up within the next week.  Return to the ER for new, worsening, or persistent severe bleeding, abdominal or chest pain, weakness, lightheadedness, difficulty breathing, or any other new or worsening symptoms that concern you.

## 2020-07-21 NOTE — ED Notes (Signed)
Patient unhooked to use toilet in room. Patient asking if she can take her blood thinner. Patient instructed to wait and ask ER doctor when he does assessment

## 2020-07-21 NOTE — ED Notes (Signed)
ED Provider at bedside. 

## 2020-07-21 NOTE — ED Triage Notes (Signed)
Pt reports has hx of anemia and thinks she needs a blood transfusion because she has been bleeding forever. Pt also reports some discomfort in her chest and feels like it is hard to breathe. Pt also c/o feeling weak and having some abd cramps

## 2020-07-26 ENCOUNTER — Other Ambulatory Visit: Payer: Self-pay | Admitting: Obstetrics & Gynecology

## 2020-07-26 DIAGNOSIS — R109 Unspecified abdominal pain: Secondary | ICD-10-CM

## 2020-07-27 ENCOUNTER — Emergency Department: Payer: BC Managed Care – PPO

## 2020-07-27 ENCOUNTER — Other Ambulatory Visit: Payer: Self-pay

## 2020-07-27 ENCOUNTER — Emergency Department
Admission: EM | Admit: 2020-07-27 | Discharge: 2020-07-27 | Disposition: A | Discharge status: Home / Self Care | Payer: BC Managed Care – PPO | Attending: Emergency Medicine | Admitting: Emergency Medicine

## 2020-07-27 DIAGNOSIS — R1900 Intra-abdominal and pelvic swelling, mass and lump, unspecified site: Secondary | ICD-10-CM

## 2020-07-27 DIAGNOSIS — E119 Type 2 diabetes mellitus without complications: Secondary | ICD-10-CM | POA: Insufficient documentation

## 2020-07-27 DIAGNOSIS — I10 Essential (primary) hypertension: Secondary | ICD-10-CM | POA: Insufficient documentation

## 2020-07-27 DIAGNOSIS — Z79899 Other long term (current) drug therapy: Secondary | ICD-10-CM | POA: Insufficient documentation

## 2020-07-27 DIAGNOSIS — Z7984 Long term (current) use of oral hypoglycemic drugs: Secondary | ICD-10-CM | POA: Insufficient documentation

## 2020-07-27 DIAGNOSIS — Z9049 Acquired absence of other specified parts of digestive tract: Secondary | ICD-10-CM | POA: Insufficient documentation

## 2020-07-27 DIAGNOSIS — K219 Gastro-esophageal reflux disease without esophagitis: Secondary | ICD-10-CM | POA: Diagnosis not present

## 2020-07-27 DIAGNOSIS — R1033 Periumbilical pain: Secondary | ICD-10-CM | POA: Diagnosis present

## 2020-07-27 DIAGNOSIS — R1030 Lower abdominal pain, unspecified: Secondary | ICD-10-CM

## 2020-07-27 DIAGNOSIS — R1909 Other intra-abdominal and pelvic swelling, mass and lump: Secondary | ICD-10-CM | POA: Diagnosis not present

## 2020-07-27 LAB — COMPREHENSIVE METABOLIC PANEL
ALT: 55 U/L — ABNORMAL HIGH (ref 0–44)
AST: 42 U/L — ABNORMAL HIGH (ref 15–41)
Albumin: 3 g/dL — ABNORMAL LOW (ref 3.5–5.0)
Alkaline Phosphatase: 146 U/L — ABNORMAL HIGH (ref 38–126)
Anion gap: 8 (ref 5–15)
BUN: 6 mg/dL (ref 6–20)
CO2: 23 mmol/L (ref 22–32)
Calcium: 8.6 mg/dL — ABNORMAL LOW (ref 8.9–10.3)
Chloride: 106 mmol/L (ref 98–111)
Creatinine, Ser: 0.6 mg/dL (ref 0.44–1.00)
GFR, Estimated: >60 mL/min (ref >60)
Glucose, Bld: 115 mg/dL — ABNORMAL HIGH (ref 70–99)
Potassium: 3.9 mmol/L (ref 3.5–5.1)
Sodium: 137 mmol/L (ref 135–145)
Total Bilirubin: 0.4 mg/dL (ref 0.3–1.2)
Total Protein: 7.4 g/dL (ref 6.5–8.1)

## 2020-07-27 LAB — CBC
HCT: 26 % — ABNORMAL LOW (ref 36.0–46.0)
Hemoglobin: 7.8 g/dL — ABNORMAL LOW (ref 12.0–15.0)
MCH: 23.5 pg — ABNORMAL LOW (ref 26.0–34.0)
MCHC: 30 g/dL (ref 30.0–36.0)
MCV: 78.3 fL — ABNORMAL LOW (ref 80.0–100.0)
Platelets: 482 10*3/uL — ABNORMAL HIGH (ref 150–400)
RBC: 3.32 MIL/uL — ABNORMAL LOW (ref 3.87–5.11)
RDW: 15.9 % — ABNORMAL HIGH (ref 11.5–15.5)
WBC: 9.5 10*3/uL (ref 4.0–10.5)
nRBC: 0.3 % — ABNORMAL HIGH (ref 0.0–0.2)

## 2020-07-27 LAB — URINALYSIS, COMPLETE (UACMP) WITH MICROSCOPIC
Bilirubin Urine: NEGATIVE
Glucose, UA: NEGATIVE mg/dL
Ketones, ur: NEGATIVE mg/dL
Leukocytes,Ua: NEGATIVE
Nitrite: NEGATIVE
Protein, ur: 100 mg/dL — AB
Specific Gravity, Urine: 1.018 (ref 1.005–1.030)
pH: 7 (ref 5.0–8.0)

## 2020-07-27 LAB — LIPASE, BLOOD: Lipase: 24 U/L (ref 11–51)

## 2020-07-27 LAB — PREGNANCY, URINE: Preg Test, Ur: NEGATIVE

## 2020-07-27 IMAGING — CT CT ABD-PELV W/ CM
2 of 4 series · 16 of 46 positions shown, 18 images · IV contrast (APPLIED)
Comparison: None.

CLINICAL DATA: Lower abdominal pain for 1 month.

EXAM:
CT ABDOMEN AND PELVIS WITH CONTRAST
TECHNIQUE: Multidetector CT imaging of the abdomen and pelvis was performed
using the standard protocol following bolus administration of
intravenous contrast.
CONTRAST:  125mL OMNIPAQUE IOHEXOL 300 MG/ML  SOLN

[Series 2: routine abd/pel with · axial · 0.89mm/px · z∈[-448,+56]mm · 13 of 111 slices shown, 15 images]
[im 5/111  soft-tissue]
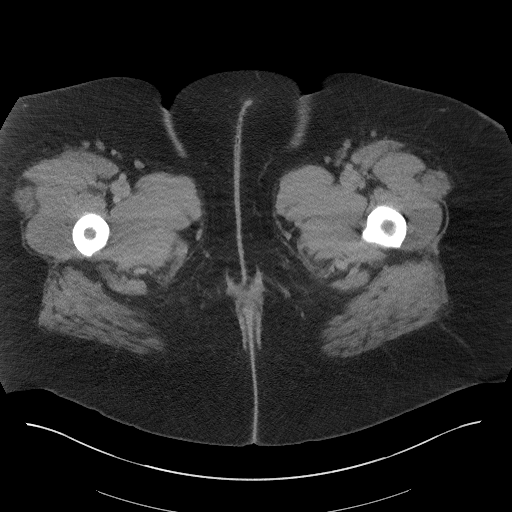
[im 5/111  bone]
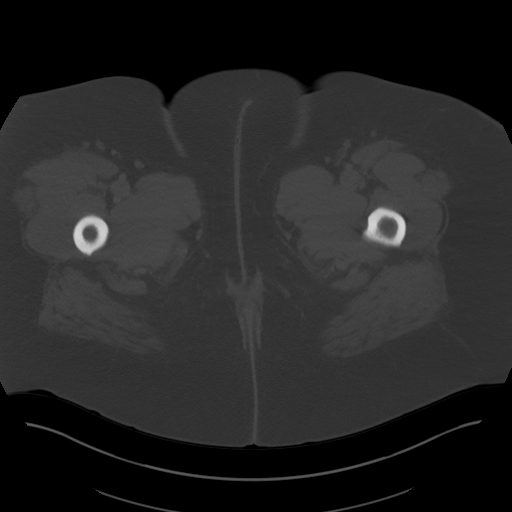
[im 15/111  soft-tissue]
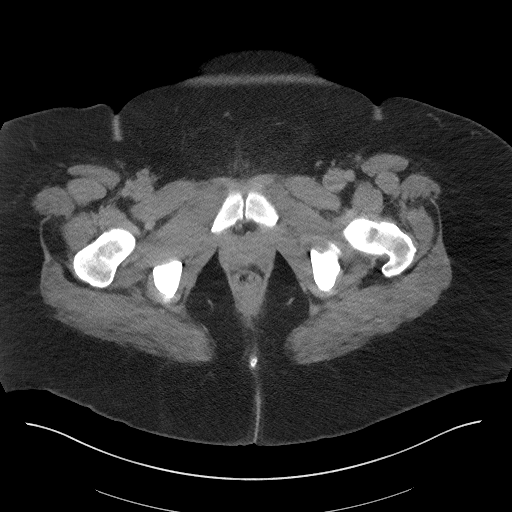
[im 24/111  soft-tissue]
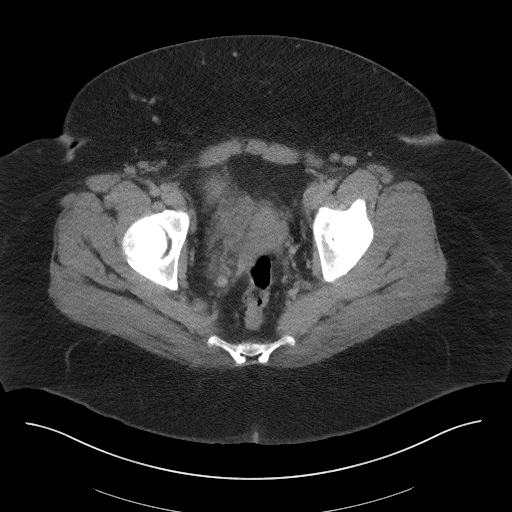
[im 29/111  soft-tissue]
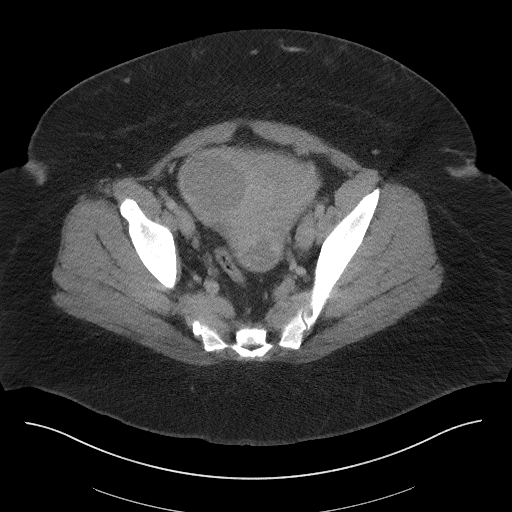
[im 39/111  soft-tissue]
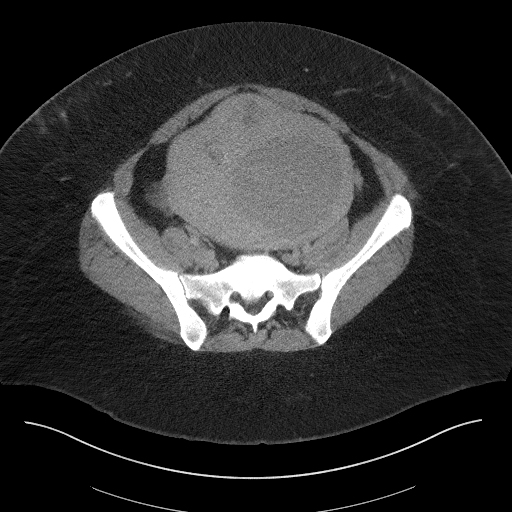
[im 48/111  soft-tissue]
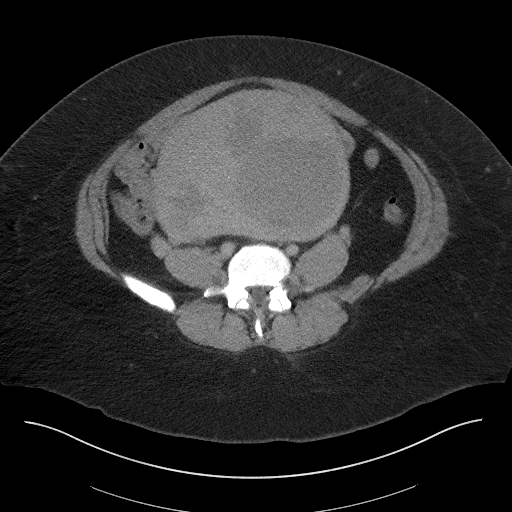
[im 58/111  soft-tissue]
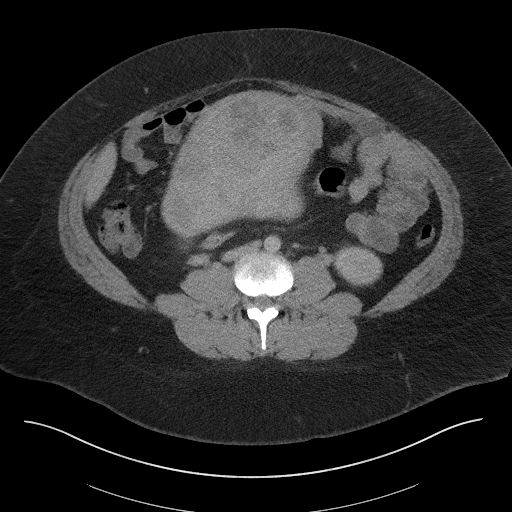
[im 63/111  soft-tissue]
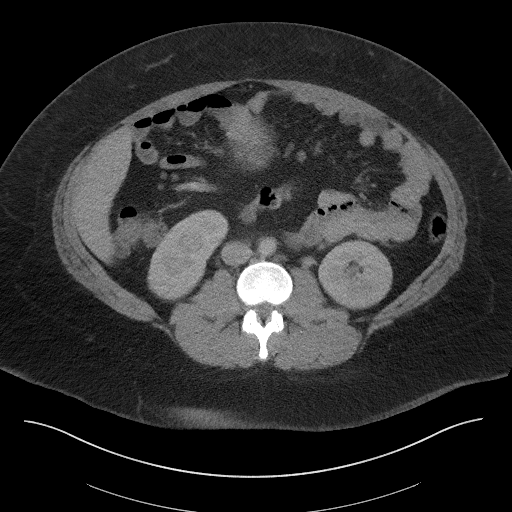
[im 72/111  soft-tissue]
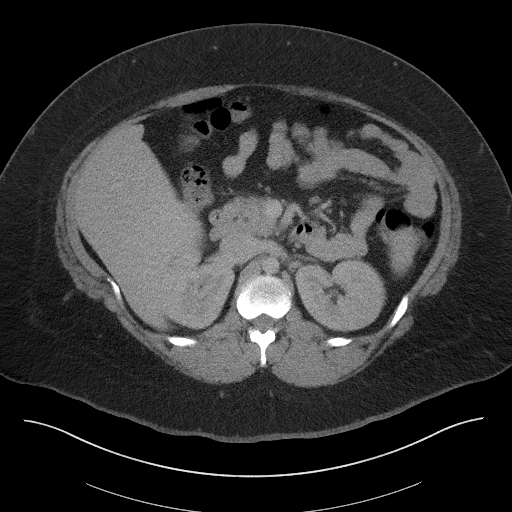
[im 72/111  bone]
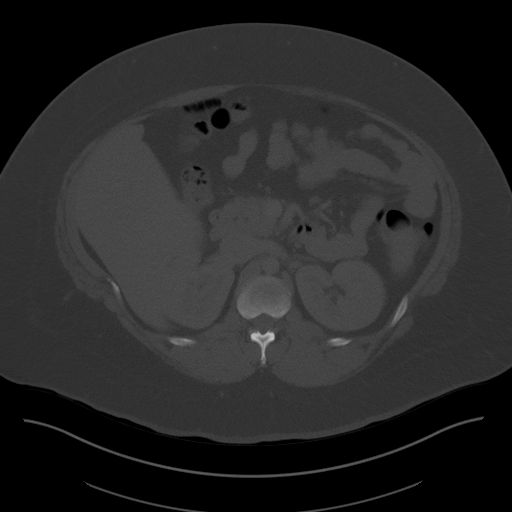
[im 82/111  soft-tissue]
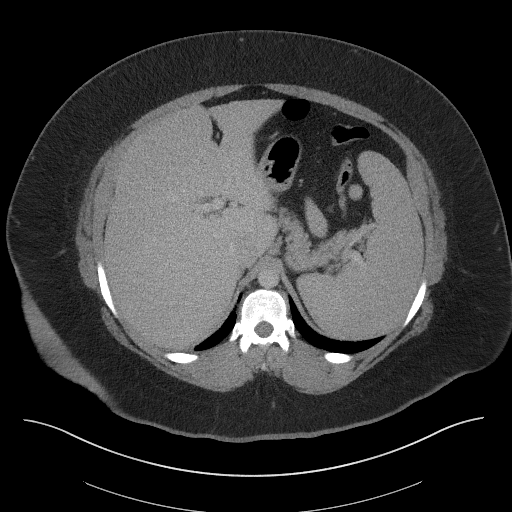
[im 87/111  soft-tissue]
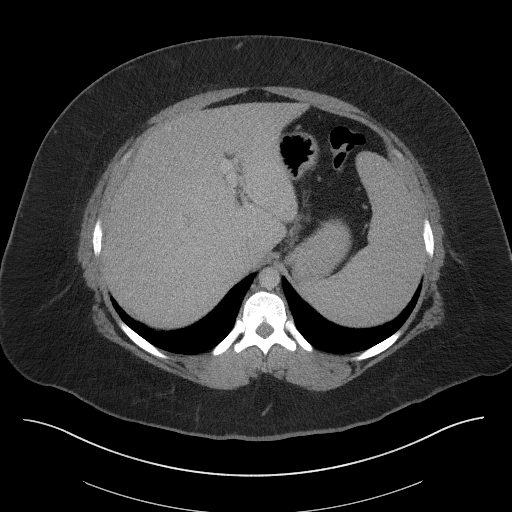
[im 96/111  soft-tissue]
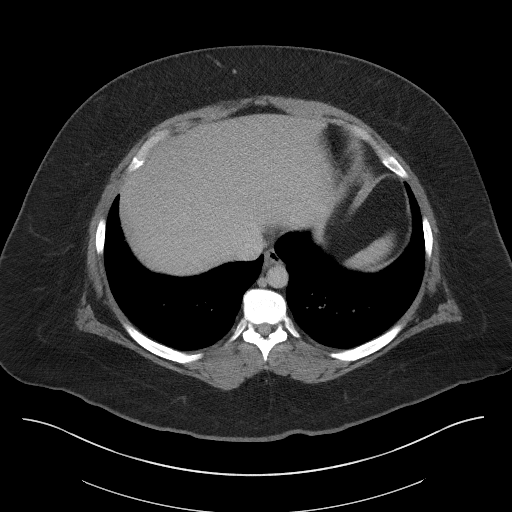
[im 106/111  soft-tissue]
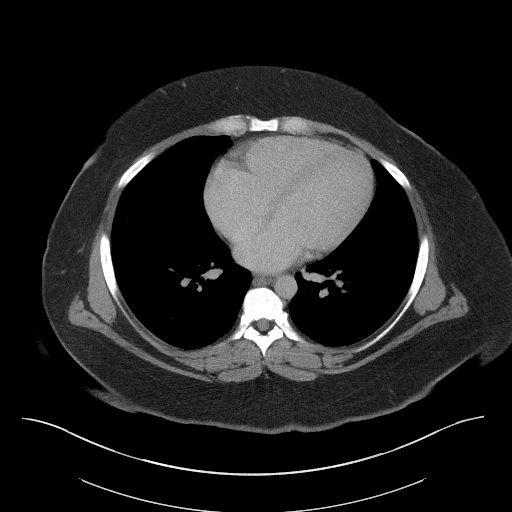

[Series 5: coronal st · coronal · 1.01mm/px · 3 of 131 slices shown]
[im 44/131  soft-tissue]
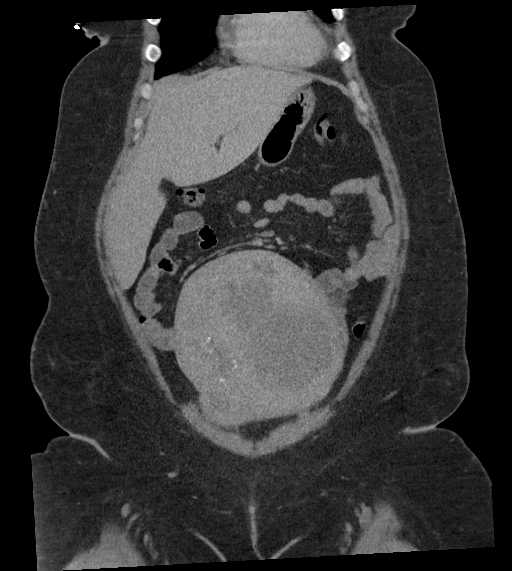
[im 58/131  soft-tissue]
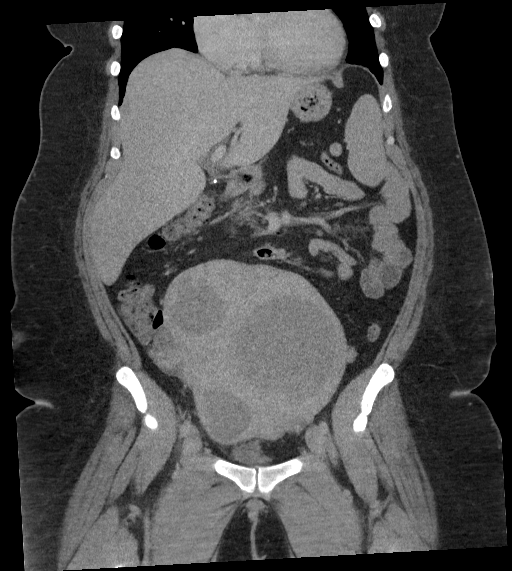
[im 73/131  soft-tissue]
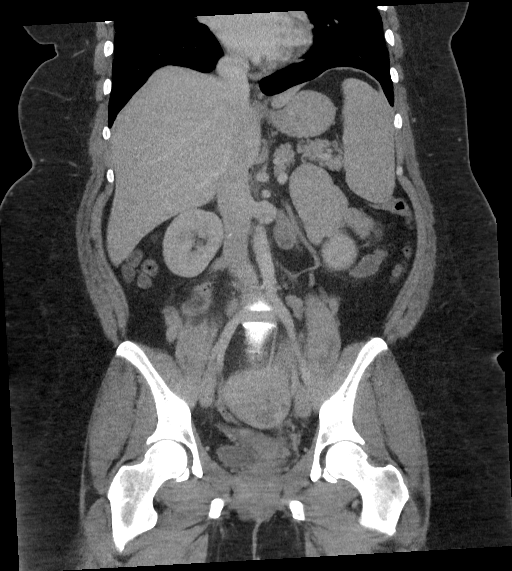

[16 of 46 positions shown; findings below may reference images not displayed]

FINDINGS: Lower chest: No acute abnormality.

Hepatobiliary: No focal liver abnormality is seen. Status post
cholecystectomy. No biliary dilatation.

Pancreas: Unremarkable. No pancreatic ductal dilatation or
surrounding inflammatory changes.

Spleen: Normal in size without focal abnormality.

Adrenals/Urinary Tract: Adrenal glands are unremarkable. Kidneys are
normal, without renal calculi, focal lesion, or hydronephrosis.
Bladder is unremarkable.

Stomach/Bowel: Stomach is within normal limits. Appendix appears
normal. No evidence of bowel wall thickening, distention, or
inflammatory changes.

Vascular/Lymphatic: No significant vascular findings are present. No
enlarged abdominal or pelvic lymph nodes.

Reproductive: There is a large heterogeneously enhancing solid mass
arising from the pelvis and extending up into the mid abdomen
measuring approximately 17.4 x 14.0 x 18.0 cm. There are coarse
internal calcifications. Favor ovarian etiology though cannot
completely rule out uterine origin. The bilateral ovaries are not
well seen.

Other: No abdominal wall hernia or abnormality. No abdominopelvic
ascites.

Musculoskeletal: No acute or significant osseous findings.
IMPRESSION: Large heterogeneously enhancing solid mass arising from the pelvis
and extending up into the mid abdomen measuring approximately 17.4 x
14.0 by 18.0 cm. Favor ovarian etiology though cannot completely
rule out uterine origin. Differential considerations include ovarian
germ-cell tumor, fibroma/thecoma. Recommend gynecology consultation.
May consider further evaluation with pelvic ultrasound or MRI.

These results were called by telephone at the time of interpretation
on [DATE] at [DATE] to provider JUMPER , who verbally
acknowledged these results.

## 2020-07-27 MED ORDER — SODIUM CHLORIDE 0.9 % IV BOLUS
1000.0000 mL | Freq: Once | INTRAVENOUS | Status: AC
  Administered 2020-07-27: 1000 mL via INTRAVENOUS

## 2020-07-27 MED ORDER — KETOROLAC TROMETHAMINE 30 MG/ML IJ SOLN
30.0000 mg | Freq: Once | INTRAMUSCULAR | Status: DC
  Filled 2020-07-27: qty 1

## 2020-07-27 MED ORDER — IBUPROFEN 600 MG PO TABS
600.0000 mg | ORAL_TABLET | Freq: Once | ORAL | Status: DC
  Filled 2020-07-27: qty 1

## 2020-07-27 MED ORDER — IOHEXOL 300 MG/ML  SOLN
125.0000 mL | Freq: Once | INTRAMUSCULAR | Status: AC | PRN
  Administered 2020-07-27: 125 mL via INTRAVENOUS

## 2020-07-27 MED ORDER — HYDROCODONE-ACETAMINOPHEN 5-325 MG PO TABS: 1.0000 | ORAL_TABLET | ORAL | 0 refills | Status: DC | PRN

## 2020-07-27 MED ORDER — MORPHINE SULFATE (PF) 4 MG/ML IV SOLN
4.0000 mg | Freq: Once | INTRAVENOUS | Status: AC
Start: 2020-07-27 — End: 2020-07-27
  Administered 2020-07-27: 4 mg via INTRAVENOUS
  Filled 2020-07-27: qty 1

## 2020-07-27 NOTE — Progress Notes (Signed)
07/27/20 1455  Clinical Encounter Type  Visited With Patient and family together  Visit Type Initial;Spiritual support;Social support  Referral From Nurse  Consult/Referral To Chaplain   Chaplain responded to a page from Doctor stating the PT could use Chaplain support. PT was told some news regarding her health, which made her somewhat upset. Chaplain met with PT and family. PT was able to express her emotions, shock, faith, and fear regarding diagnosis. Prayer, presence,  and comfort provided at the bedside. 

## 2020-07-27 NOTE — ED Provider Notes (Signed)
[facility] Emergency Department Provider Note   ____________________________________________   Event Date/Time   First MD Initiated Contact with Patient 07/27/20 1012     (approximate)  I have reviewed the triage vital signs and the nursing notes.   HISTORY  Chief Complaint Abdominal Pain    HPI Heather Cherry is a 35 y.o. female with a past medical history of type 2 diabetes, hypertension, and morbid obesity who presents for recurrent periumbilical abdominal pain present over the last month.  Patient was seen here 1 week ago and followed up with GI recently for an endoscopy/colonoscopy that she states she has not had the results back from yet.  Patient also states that she has been on her period since the end of February and has not stopped having vaginal bleeding since then.  Patient describes a feeling of "hardness" in the periumbilical region that is worse with palpitation or movement.  Endorses associated nausea.  Patient currently denies any vision changes, tinnitus, difficulty speaking, facial droop, sore throat, chest pain, shortness of breath, vomiting/diarrhea, dysuria, or weakness/numbness/paresthesias in any extremity         Past Medical History:  Diagnosis Date  . Abnormal vaginal bleeding   . Anxiety   . Depression   . Diabetes mellitus without complication ([place])   . DVT (deep venous thrombosis) ([place])   . Fibroid   . Graves disease   . Hypertension   . Obesity   . Pulmonary emboli ([place])   . Sinus congestion     Patient Active Problem List   Diagnosis Date Noted  . Diabetes mellitus without complication ([place]) 10/62/6948  . DVT (deep vein thrombosis) in pregnancy 03/26/2016  . Hypokalemia 03/26/2016  . Anemia 03/26/2016  . Morbid obesity ([place]) 03/26/2016  . Pulmonary embolism ([place]) 03/24/2016  . Pseudotumor cerebri 03/19/2016  . Pleuritic chest pain 03/18/2016  . Chest pain 03/18/2016  . Right pulmonary embolus ([place]) 03/17/2016   . PE (pulmonary thromboembolism) ([place]) 03/12/2016  . DVT (deep venous thrombosis) ([place]) 03/12/2016  . HTN (hypertension) 03/12/2016  . Depression 03/12/2016  . Anxiety 03/12/2016  . GERD (gastroesophageal reflux disease) 03/12/2016  . Hyperglycemia 03/12/2016    Past Surgical History:  Procedure Laterality Date  . CHOLECYSTECTOMY    . COLONOSCOPY    . IVC FILTER REMOVAL N/A 04/29/2017   Procedure: IVC FILTER REMOVAL;  Surgeon: Algernon Huxley, MD;  Location: [place] [facility];  Service: Cardiovascular;  Laterality: N/A;  . PERIPHERAL VASCULAR CATHETERIZATION N/A 03/25/2016   Procedure: IVC Filter Insertion;  Surgeon: Katha Cabal, MD;  Location: [place] [facility];  Service: Cardiovascular;  Laterality: N/A;  . WISDOM TOOTH EXTRACTION      Prior to Admission medications   Medication Sig Start Date End Date Taking? Authorizing Provider  acetaminophen (TYLENOL) 500 MG tablet Take 1,000 mg by mouth every 6 (six) hours as needed for moderate pain or headache.     [provider]  acetaZOLAMIDE (DIAMOX) 250 MG tablet Take 250 mg by mouth daily.    [provider]  amLODipine (NORVASC) 5 MG tablet Take 5 mg by mouth daily.    [provider]  DULoxetine (CYMBALTA) 30 MG capsule Take 30 mg by mouth daily.    [provider]  enoxaparin (LOVENOX) 80 MG/0.8ML injection Inject 160 mg into the skin every 12 (twelve) hours.    [provider]  ferrous sulfate 325 (65 FE) MG tablet Take 325 mg by mouth 3 (three)  times daily with meals.    [provider]  HYDROcodone-acetaminophen (NORCO) 5-325 MG tablet Take 1 tablet by mouth every 4 (four) hours as needed for moderate pain. 07/27/20   Naaman Plummer, MD  metFORMIN (GLUCOPHAGE-XR) 500 MG 24 hr tablet Take 500 mg by mouth daily with breakfast.    [provider]  methimazole (TAPAZOLE) 10 MG tablet Take 10-20 mg by mouth See admin instructions. Take 2 tablets (20mg ) by mouth  every morning and take 1 tablet (10mg ) by mouth every evening    [provider]  metoprolol succinate (TOPROL-XL) 100 MG 24 hr tablet Take 100 mg by mouth daily.    [provider]  omeprazole (PRILOSEC) 20 MG capsule Take 20 mg by mouth daily.    [provider]  oxyCODONE-acetaminophen (PERCOCET) 5-325 MG tablet Take 1 tablet by mouth every 4 (four) hours as needed for severe pain. 07/21/20 07/21/21  Arta Silence, MD  rizatriptan (MAXALT) 10 MG tablet Take 10 mg by mouth as needed for migraine. May repeat in 2 hours if needed    [provider]    Allergies Lisinopril  Family History  Problem Relation Age of Onset  . Cancer Paternal Grandfather   . Breast cancer Paternal Grandmother   . Rheumatologic disease Neg Hx   . Colon cancer Neg Hx   . Endometrial cancer Neg Hx   . Prostate cancer Neg Hx   . Pancreatic cancer Neg Hx   . Ovarian cancer Neg Hx     Social History Social History   Tobacco Use  . Smoking status: Never Smoker  . Smokeless tobacco: Never Used  Vaping Use  . Vaping Use: Never used  Substance Use Topics  . Alcohol use: Yes    Comment: occas. social  . Drug use: No    Review of Systems Constitutional: No fever/chills Eyes: No visual changes. ENT: No sore throat. Cardiovascular: Denies chest pain. Respiratory: Denies shortness of breath. Gastrointestinal: Endorses abdominal pain and nausea.  No vomiting.  No diarrhea. Genitourinary: Negative for dysuria. Musculoskeletal: Negative for acute arthralgias Skin: Negative for rash. Neurological: Negative for headaches, weakness/numbness/paresthesias in any extremity Psychiatric: Negative for suicidal ideation/homicidal ideation   ____________________________________________   PHYSICAL EXAM:  VITAL SIGNS: ED Triage Vitals  Enc Vitals Group     BP 07/27/20 1018 (!) 156/68     Pulse Rate 07/27/20 1015 (!) 103     Resp 07/27/20 1015 20     Temp 07/27/20 1015  98.4 F (36.9 C)     Temp Source 07/27/20 1015 Oral     SpO2 07/27/20 1015 98 %     Weight 07/27/20 1016 (!) 365 lb (165.6 kg)     Height 07/27/20 1016 5\' 9"  (1.753 m)     Head Circumference --      Peak Flow --      Pain Score 07/27/20 1016 10     Pain Loc --      Pain Edu? --      Excl. in [place]? --    Constitutional: Alert and oriented. Well appearing morbidly obese African-American female who appears stated age and in no acute distress. Eyes: Conjunctivae are normal. PERRL. Head: Atraumatic. Nose: No congestion/rhinnorhea. Mouth/Throat: Mucous membranes are moist. Neck: No stridor Cardiovascular: Grossly normal heart sounds.  Good peripheral circulation. Respiratory: Normal respiratory effort.  No retractions. Gastrointestinal: Soft and mild tenderness to palpation in the periumbilical region. No distention. Musculoskeletal: No obvious deformities Neurologic:  Normal speech and  language. No gross focal neurologic deficits are appreciated. Skin:  Skin is warm and dry. No rash noted. Psychiatric: Mood and affect are normal. Speech and behavior are normal.  ____________________________________________   LABS (all labs ordered are listed, but only abnormal results are displayed)  Labs Reviewed  COMPREHENSIVE METABOLIC PANEL - Abnormal; Notable for the following components:      Result Value   Glucose, Bld 115 (*)    Calcium 8.6 (*)    Albumin 3.0 (*)    AST 42 (*)    ALT 55 (*)    Alkaline Phosphatase 146 (*)    All other components within normal limits  CBC - Abnormal; Notable for the following components:   RBC 3.32 (*)    Hemoglobin 7.8 (*)    HCT 26.0 (*)    MCV 78.3 (*)    MCH 23.5 (*)    RDW 15.9 (*)    Platelets 482 (*)    nRBC 0.3 (*)    All other components within normal limits  URINALYSIS, COMPLETE (UACMP) WITH MICROSCOPIC - Abnormal; Notable for the following components:   Color, Urine YELLOW (*)    APPearance HAZY (*)    Hgb urine dipstick LARGE (*)     Protein, ur 100 (*)    Bacteria, UA RARE (*)    All other components within normal limits  LIPASE, BLOOD  PREGNANCY, URINE  POC URINE PREG, ED   RADIOLOGY  ED MD interpretation: CT of the abdomen and pelvis with IV contrast shows a large approximately 17 cm diameter mass of likely ovarian origin concerning for malignancy  Official radiology report(s): No results found.  ____________________________________________   PROCEDURES  Procedure(s) performed (including Critical Care):  .1-3 Lead EKG Interpretation Performed by: Naaman Plummer, MD Authorized by: Naaman Plummer, MD     Interpretation: normal     ECG rate:  89   ECG rate assessment: normal     Rhythm: sinus rhythm     Ectopy: none     Conduction: normal       ____________________________________________   INITIAL IMPRESSION / ASSESSMENT AND PLAN / ED COURSE  As part of my medical decision making, I reviewed the following data within the [facility] notes reviewed and incorporated, Labs reviewed, EKG interpreted, Old chart reviewed, Radiograph reviewed and Notes from prior ED visits reviewed and incorporated        Patients symptoms not typical for emergent causes of abdominal pain such as, but not limited to, appendicitis, abdominal aortic aneurysm, surgical biliary disease, pancreatitis, SBO, mesenteric ischemia, serious intra-abdominal bacterial illness. Presentation also not typical of gynecologic emergencies such as TOA, Ovarian Torsion, PID. Not Ectopic.  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  Discussed with patient at length the options as far as work-up for this mass and patient opted for outpatient OB/GYN follow-up for further evaluation and management.  Doubt atypical ACS.  Pt tolerating PO. Disposition: Patient will be discharged with strict return precautions and follow up with primary MD within 12-24 hours for further  evaluation. Patient understands that this still may have an early presentation of an emergent medical condition such as appendicitis that will require a recheck.      ____________________________________________   FINAL CLINICAL IMPRESSION(S) / ED DIAGNOSES  Final diagnoses:  Pelvic mass in female  Lower abdominal pain     ED Discharge Orders         Ordered  HYDROcodone-acetaminophen (NORCO) 5-325 MG tablet  Every 4 hours PRN,   Status:  Discontinued        07/27/20 1522    HYDROcodone-acetaminophen (NORCO) 5-325 MG tablet  Every 4 hours PRN        07/27/20 1559           Note:  This document was prepared using Dragon voice recognition software and may include unintentional dictation errors.   Naaman Plummer, MD 07/29/20 1515

## 2020-07-27 NOTE — ED Notes (Signed)
Order placed for IV team after 3 IV attempts.

## 2020-07-27 NOTE — ED Notes (Signed)
Pt refused EKG with triage RN

## 2020-07-27 NOTE — ED Notes (Signed)
Pt unable to reach sig pad, pt verbalizes understanding and agreement of MSE waiver

## 2020-07-27 NOTE — ED Triage Notes (Signed)
PT to ED POV for lower abd x1 month. States was seen here for same 1 week ago and was told to come back if no improvement.  Denies N/V/D Pt also reports she has been on her period since the end of February  Ambulatory to triage, NAD noted Pt refuses EKG , states "just got sticky stuff off and nothing is wrong with my heart"

## 2020-07-29 ENCOUNTER — Encounter: Payer: Self-pay | Admitting: Gynecologic Oncology

## 2020-07-30 NOTE — Progress Notes (Signed)
GYNECOLOGIC ONCOLOGY NEW PATIENT CONSULTATION   Patient Name: Heather Cherry  Patient Age: 35 y.o. Date of Service: 07/31/20 Referring Provider: Lyda Kalata MD  Primary Care Provider: Katherina Mires, MD Consulting Provider: Jeral Pinch, MD   Assessment/Plan:  Premenopausal patient with significant medical comorbidities presenting with abnormal uterine bleeding in the setting of a large, fibroid uterus.  I reviewed in detail with the patient and her mother recent events, including visits to the ED, EMB from February, and recent CT scan. She underwent CT in January for abdominal pain and was noted to have a 20cm fibroid uterus. Given prior imaging from January, my review of her recent CT scan in our system, and a self-reported history of uterine fibroids, I believe that her symptoms are being cause by a large, multi-fibroid uterus. My suspicion is that she is having degeneration of one (or multiple) large fibroids, contributing to her pain and possibly to her bleeding.    While the patient is wanting to proceed with treatment of both her pain and bleeding, her fertility and uterine preservation desires are not definitive. Today, she voices interest in at least the possibility of maintaining her uterus for future pregnancy. While I have concerns about her ability to achieve pregnancy (even if myomectomy were to be performed and successful), I think that this merits further discussion and investigation of options. I suggested that the patient see someone who specializes in minimally invasive myomectomy procedures. I offered to refer her to someone in [place] or a specialist at [facility]. Her preference to to first see someone locally.   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 She has an appointment with her cardiologist at the end of the month and I encouraged her to keep this to discuss steps that  she might need to take in the setting of possible future surgical intervention. Even if myomectomy is not undertaken, I would recommend consideration of other medical interventions prior to proceeding with definitive surgery. For either treatment of her pain/bleeding, or to help make surgical undertaking more feasible and successful, we discussed the use of Lupron or uterine artery embolization. She seemed open to discussion of both of these options.  I think she needs some intervention in the near future to help control her bleeding. She is anemic (continues on oral iron) and I worry that some of her other symptoms (eg headache) may be related to her blood-loss anemia.  To help from a surgical counseling standpoint (to map fibroids) and to more definitively rule out an ovarian etiology for pelvic mass seen, I suggested that we get a pelvic MRI. The patient was amenable to having this done.  A copy of this note was sent to the patient's referring provider.   70 minutes of total time was spent for this patient encounter, including preparation, face-to-face counseling with the patient and coordination of care, and documentation of the encounter.  Jeral Pinch, MD  Division of Gynecologic Oncology  Department of Obstetrics and Gynecology  [facility]  ___________________________________________  Chief Complaint: Chief Complaint  Patient presents with  . Pelvic mass    History of Present Illness:  Heather Cherry is a 35 y.o. y.o. female who is seen in consultation at the request of Dr. Shellia Cleverly for an evaluation of pelvic mass.  Patient presented in February to see her GYN in the setting of abnormal uterine bleeding.  She notes being started  on norethindrone at that time although she has continued to bleed since.  She has been seen in the emergency department twice in the last 2 weeks.  The first time she presented secondary to headache and being feverish.  Her  hemoglobin at that time was noted to be just below 9.  Shortly before her last presentation to the emergency department on 4/16, she noticed her abdomen was hard.  She had seen her gynecologist who was trying to get a CT scan.  Due to feeling "horrible" (which the patient describes as having abdominal pain and headache), her mother took her to the hospital to be seen again last weekend.  Her hemoglobin at that time was noted to be 7.8.  She continues to endorse vaginal bleeding.  She had been on norethindrone and then transitioned to Provera.  She stopped that recently and is now not taking any medications.  She describes her bleeding as sometimes being bright red and heavy with passage of large clots and other days being lighter and dark brown.  She wears depends and has to change these multiple times a day.  She had an endometrial biopsy in February of this year that showed no hyperplasia or malignancy.  She notes decreased appetite since her sigmoidoscopy earlier in April.  Patient reports a history of uterine fibroids.  Her outside notes describe this history starting in at least 2016.  The patient previously underwent CT of the abdomen and pelvis in January of this year secondary to upper abdominal pain with nausea.  Findings included an enlarged heterogeneously enhancing globular uterus noted to be new since 2017, although comparison imaging was just an MRI of her abdomen.  Her past medical history is notable for a DVT in her right leg diagnosed in November about a month after she had gallbladder surgery.  She was on oral contraceptive pills at this time and was diagnosed with a pulmonary embolism in December 2017.  She is also been diagnosed with antiphospholipid antibody syndrome and it sounds like it is believed that was likely the reason for her VTE.  She is on anticoagulation and it appears that the plan is to keep her on that long-term.  She sees a Engineer, civil (consulting) within the [place] system.  Patient  also has Graves' disease which she describes as being well controlled currently.  She has been diagnosed with pulmonary hypertension.  She recently had a CT angiogram of her chest due to some symptoms of shortness of breath with no pulmonary embolism noted.  She denies any current shortness of breath or chest pain.  Her family history is notable for kidney cancer in her mother, breast and her paternal grandmother and maternal grandmother, and lymphoma in her paternal grandfather.  Patient lives in [place] by herself.  She works as a Pharmacist, hospital.  She reports occasional alcohol use as well as THC.  PAST MEDICAL HISTORY:  Past Medical History:  Diagnosis Date  . Abnormal vaginal bleeding   . Anxiety   . Depression   . Diabetes mellitus without complication ([place])   . DVT (deep venous thrombosis) ([place])   . Fibroid   . Graves disease   . Hypertension   . Obesity   . Pulmonary emboli ([place])   . Sinus congestion      PAST SURGICAL HISTORY:  Past Surgical History:  Procedure Laterality Date  . CHOLECYSTECTOMY    . COLONOSCOPY    . IVC FILTER REMOVAL N/A 04/29/2017   Procedure: IVC FILTER REMOVAL;  Surgeon:  Algernon Huxley, MD;  Location: [place] [facility];  Service: Cardiovascular;  Laterality: N/A;  . PERIPHERAL VASCULAR CATHETERIZATION N/A 03/25/2016   Procedure: IVC Filter Insertion;  Surgeon: Katha Cabal, MD;  Location: [place] [facility];  Service: Cardiovascular;  Laterality: N/A;  . WISDOM TOOTH EXTRACTION      OB/GYN HISTORY:  OB History  Gravida Para Term Preterm AB Living  0            SAB IAB Ectopic Multiple Live Births               Patient's last menstrual period was 07/27/2020.  Age at menarche: 42 Age at menopause: N/AA Hx of HRT: N/AA Hx of STDs: No Last pap: 08/2018, normal, HPV negative History of abnormal pap smears: No  SCREENING STUDIES:  Last mammogram: N/AA  Last colonoscopy: 2022  MEDICATIONS: Outpatient Encounter Medications as of 07/31/2020   Medication Sig  . acetaminophen (TYLENOL) 500 MG tablet Take 1,000 mg by mouth every 6 (six) hours as needed for moderate pain or headache.   Marland Kitchen acetaZOLAMIDE (DIAMOX) 250 MG tablet Take 250 mg by mouth daily.  Marland Kitchen amLODipine (NORVASC) 5 MG tablet Take 5 mg by mouth daily.  . DULoxetine (CYMBALTA) 30 MG capsule Take 30 mg by mouth daily.  Marland Kitchen enoxaparin (LOVENOX) 80 MG/0.8ML injection Inject 160 mg into the skin every 12 (twelve) hours.  . ferrous sulfate 325 (65 FE) MG tablet Take 325 mg by mouth 3 (three) times daily with meals.  Marland Kitchen HYDROcodone-acetaminophen (NORCO) 5-325 MG tablet Take 1 tablet by mouth every 4 (four) hours as needed for moderate pain.  . metFORMIN (GLUCOPHAGE-XR) 500 MG 24 hr tablet Take 500 mg by mouth daily with breakfast.  . methimazole (TAPAZOLE) 10 MG tablet Take 10-20 mg by mouth See admin instructions. Take 2 tablets (20mg ) by mouth every morning and take 1 tablet (10mg ) by mouth every evening  . metoprolol succinate (TOPROL-XL) 100 MG 24 hr tablet Take 100 mg by mouth daily.  Marland Kitchen omeprazole (PRILOSEC) 20 MG capsule Take 20 mg by mouth daily.  Marland Kitchen oxyCODONE-acetaminophen (PERCOCET) 5-325 MG tablet Take 1 tablet by mouth every 4 (four) hours as needed for severe pain.  . rizatriptan (MAXALT) 10 MG tablet Take 10 mg by mouth as needed for migraine. May repeat in 2 hours if needed  . [DISCONTINUED] medroxyPROGESTERone (PROVERA) 10 MG tablet Take 2 tablets (20 mg total) by mouth 2 (two) times daily for 7 days.   No facility-administered encounter medications on file as of 07/31/2020.    ALLERGIES:  Allergies  Allergen Reactions  . Lisinopril Cough and Swelling     FAMILY HISTORY:  Family History  Problem Relation Age of Onset  . Cancer Paternal Grandfather   . Breast cancer Paternal Grandmother   . Rheumatologic disease Neg Hx   . Colon cancer Neg Hx   . Endometrial cancer Neg Hx   . Prostate cancer Neg Hx   . Pancreatic cancer Neg Hx   . Ovarian cancer Neg Hx       SOCIAL HISTORY:  Social Connections: Not on file    REVIEW OF SYSTEMS:  Endorses decreased appetite, urinary frequency, pelvic pain, vaginal bleeding, dizziness, headache, anxiety. Denies fevers, chills, fatigue, unexplained weight changes. Denies hearing loss, neck lumps or masses, mouth sores, ringing in ears or voice changes. Denies cough or wheezing.  Denies shortness of breath. Denies chest pain or palpitations. Denies leg swelling. Denies abdominal distention, pain, blood in stools,  constipation, diarrhea, nausea, vomiting, or early satiety. Denies pain with intercourse, dysuria, hematuria or incontinence. Denies hot flashes,  vaginal discharge.   Denies joint pain, back pain or muscle pain/cramps. Denies itching, rash, or wounds. Denies numbness or seizures. Denies swollen lymph nodes or glands, denies easy bruising or bleeding. Denies depression, confusion, or decreased concentration.  Physical Exam:  Vital Signs for this encounter:  Blood pressure (!) 165/93, pulse 84, temperature 97.7 F (36.5 C), temperature source Tympanic, resp. rate 20, height 5\' 9"  (1.753 m), weight (!) 360 lb (163.3 kg), last menstrual period 07/27/2020, SpO2 100 %. Body mass index is 53.16 kg/m. General: Alert, oriented, no acute distress.  HEENT: Normocephalic, atraumatic. Sclera anicteric.  Chest: Clear to auscultation bilaterally. No wheezes, rhonchi, or rales. Cardiovascular: Regular rate and rhythm, no murmurs, rubs, or gallops.  Abdomen: Obese. Normoactive bowel sounds. Soft, nondistended, mildly tender to palpation around and just superior to the umbilicus where a firm palpable mass is appreciated.  There is some firmness in the mid lower abdomen along the midline into the left, no tenderness with palpation of this. No masses or hepatosplenomegaly appreciated. No palpable fluid wave.  Extremities: Grossly normal range of motion. Warm, well perfused. No edema bilaterally.  Skin: No rashes or  lesions.  Lymphatics: No cervical, supraclavicular, or inguinal adenopathy.  GU:  Normal external female genitalia. No lesions. No discharge or bleeding.             Bladder/urethra:  No lesions or masses, well supported bladder             Vagina: Well rugated, no lesions or masses.  Cervix somewhat difficult to visualize.               Cervix: Normal appearing, no lesions.             Uterus: Difficult secondary to patient's body habitus.  Uterus does not move significantly, supporting that recent CT scan does not fact demonstrate a multi fibroid uterus.  No nodularity appreciated.             Adnexa: No masses appreciated.  LABORATORY AND RADIOLOGIC DATA:  Outside medical records were reviewed to synthesize the above history, along with the history and physical obtained during the visit.   Lab Results  Component Value Date   WBC 9.5 07/27/2020   HGB 7.8 (L) 07/27/2020   HCT 26.0 (L) 07/27/2020   PLT 482 (H) 07/27/2020   GLUCOSE 115 (H) 07/27/2020   CHOL 141 03/26/2016   TRIG 164 (H) 03/26/2016   HDL 35 (L) 03/26/2016   LDLCALC 73 03/26/2016   ALT 55 (H) 07/27/2020   AST 42 (H) 07/27/2020   NA 137 07/27/2020   K 3.9 07/27/2020   CL 106 07/27/2020   CREATININE 0.60 07/27/2020   BUN 6 07/27/2020   CO2 23 07/27/2020   TSH 0.038 (L) 07/21/2020   INR 1.04 03/05/2017   HGBA1C 5.8 (H) 03/25/2016   CT A/P on 4/16: FINDINGS: Lower chest: No acute abnormality.  Hepatobiliary: No focal liver abnormality is seen. Status post cholecystectomy. No biliary dilatation.  Pancreas: Unremarkable. No pancreatic ductal dilatation or surrounding inflammatory changes.  Spleen: Normal in size without focal abnormality.  Adrenals/Urinary Tract: Adrenal glands are unremarkable. Kidneys are normal, without renal calculi, focal lesion, or hydronephrosis. Bladder is unremarkable.  Stomach/Bowel: Stomach is within normal limits. Appendix appears normal. No evidence of bowel wall  thickening, distention, or inflammatory changes.  Vascular/Lymphatic: No significant vascular findings are present.  No enlarged abdominal or pelvic lymph nodes.  Reproductive: There is a large heterogeneously enhancing solid mass arising from the pelvis and extending up into the mid abdomen measuring approximately 17.4 x 14.0 x 18.0 cm. There are coarse internal calcifications. Favor ovarian etiology though cannot completely rule out uterine origin. The bilateral ovaries are not well seen.  Other: No abdominal wall hernia or abnormality. No abdominopelvic ascites.  Musculoskeletal: No acute or significant osseous findings.  IMPRESSION: Large heterogeneously enhancing solid mass arising from the pelvis and extending up into the mid abdomen measuring approximately 17.4 x 14.0 by 18.0 cm. Favor ovarian etiology though cannot completely rule out uterine origin. Differential considerations include ovarian germ-cell tumor, fibroma/thecoma. Recommend gynecology consultation. May consider further evaluation with pelvic ultrasound or MRI.  These results were called by telephone at the time of interpretation on 07/27/2020 at 2:41 pm to provider [facility] , who verbally acknowledged these results.

## 2020-07-31 ENCOUNTER — Inpatient Hospital Stay: Payer: BC Managed Care – PPO | Attending: Gynecologic Oncology | Admitting: Gynecologic Oncology

## 2020-07-31 ENCOUNTER — Other Ambulatory Visit: Payer: Self-pay

## 2020-07-31 ENCOUNTER — Encounter: Payer: Self-pay | Admitting: Gynecologic Oncology

## 2020-07-31 VITALS — BP 165/93 | HR 84 | Temp 97.7°F | Resp 20 | Ht 69.0 in | Wt 360.0 lb

## 2020-07-31 DIAGNOSIS — E0500 Thyrotoxicosis with diffuse goiter without thyrotoxic crisis or storm: Secondary | ICD-10-CM | POA: Diagnosis not present

## 2020-07-31 DIAGNOSIS — F32A Depression, unspecified: Secondary | ICD-10-CM | POA: Diagnosis not present

## 2020-07-31 DIAGNOSIS — Z79899 Other long term (current) drug therapy: Secondary | ICD-10-CM | POA: Insufficient documentation

## 2020-07-31 DIAGNOSIS — I10 Essential (primary) hypertension: Secondary | ICD-10-CM | POA: Insufficient documentation

## 2020-07-31 DIAGNOSIS — Z7984 Long term (current) use of oral hypoglycemic drugs: Secondary | ICD-10-CM | POA: Diagnosis not present

## 2020-07-31 DIAGNOSIS — Z86718 Personal history of other venous thrombosis and embolism: Secondary | ICD-10-CM | POA: Diagnosis not present

## 2020-07-31 DIAGNOSIS — D251 Intramural leiomyoma of uterus: Secondary | ICD-10-CM | POA: Diagnosis not present

## 2020-07-31 DIAGNOSIS — D500 Iron deficiency anemia secondary to blood loss (chronic): Secondary | ICD-10-CM | POA: Diagnosis not present

## 2020-07-31 DIAGNOSIS — D259 Leiomyoma of uterus, unspecified: Secondary | ICD-10-CM | POA: Diagnosis not present

## 2020-07-31 DIAGNOSIS — E119 Type 2 diabetes mellitus without complications: Secondary | ICD-10-CM | POA: Diagnosis not present

## 2020-07-31 DIAGNOSIS — F419 Anxiety disorder, unspecified: Secondary | ICD-10-CM | POA: Diagnosis not present

## 2020-07-31 DIAGNOSIS — I272 Pulmonary hypertension, unspecified: Secondary | ICD-10-CM | POA: Diagnosis not present

## 2020-07-31 DIAGNOSIS — Z7901 Long term (current) use of anticoagulants: Secondary | ICD-10-CM | POA: Insufficient documentation

## 2020-07-31 DIAGNOSIS — N939 Abnormal uterine and vaginal bleeding, unspecified: Secondary | ICD-10-CM | POA: Insufficient documentation

## 2020-07-31 DIAGNOSIS — R1900 Intra-abdominal and pelvic swelling, mass and lump, unspecified site: Secondary | ICD-10-CM | POA: Insufficient documentation

## 2020-07-31 NOTE — Patient Instructions (Signed)
It was a pleasure meeting you today.  Your MRI is scheduled for the end of the month.  I will give you a call after to discuss results.  Based on my review of the report of your CT scan in January as well as your recent one in our system, I believe that your pelvic mass is your uterus with multiple fibroids.  There is comment about an MRI from 2017, but from what I am seeing, this was of your abdomen and not your pelvis.  It would not be impossible that the fibroids have grown some since that time and were just not visible on the abdominal portion of your MRI.  I am going to send a referral to one of the gynecologist in town who does more myomectomies (surgery with removal of uterine fibroids).  I think that having you talk to somebody about options before you make your decision is important.    Today, we discussed some interventions that may be helpful in the setting of surgery, whether you ultimately proceed with a myomectomy or hysterectomy.  These would include a medication that helps shut down your ovaries and puts you into menopause temporarily as well as a procedure to block some of the blood flow to your uterus (this is called a uterine artery embolization).  I would encourage you to continue with the visit you have scheduled at the end of the month with your cardiologist in the event that you need some sort of procedure under anesthesia.

## 2020-08-01 ENCOUNTER — Telehealth: Payer: Self-pay | Admitting: *Deleted

## 2020-08-01 NOTE — Telephone Encounter (Signed)
Per Dr Berline Lopes fax office and records to Dr Katherine Basset office for a referral for a myomectomy

## 2020-08-02 ENCOUNTER — Telehealth: Payer: Self-pay | Admitting: *Deleted

## 2020-08-02 NOTE — Telephone Encounter (Signed)
Fax records to Dr Boyd Kerbs office for a referral for a myomectomy

## 2020-08-06 ENCOUNTER — Ambulatory Visit
Admission: RE | Admit: 2020-08-06 | Discharge: 2020-08-06 | Disposition: A | Discharge status: Home / Self Care | Payer: BC Managed Care – PPO | Source: Ambulatory Visit | Attending: Gynecologic Oncology | Admitting: Gynecologic Oncology

## 2020-08-06 DIAGNOSIS — D251 Intramural leiomyoma of uterus: Secondary | ICD-10-CM

## 2020-08-06 IMAGING — MR MR PELVIS WO/W CM
8 of 13 series · 26 of 48 positions shown · IV contrast (Multihance)
Comparison: CT on [DATE]

CLINICAL DATA: Pelvis and lower abdominal mass on recent CT. Lower
abdominal and pelvic pain.

EXAM:
MRI PELVIS WITHOUT AND WITH CONTRAST
TECHNIQUE: Multiplanar multisequence MR imaging of the pelvis was performed
both before and after administration of intravenous contrast.
CONTRAST:  20mL MULTIHANCE GADOBENATE DIMEGLUMINE 529 MG/ML IV SOLN

[Series 5: axial in out · axial · 6.0mm · 0.74mm/px · z∈[-100,+203]mm · 4 of 90 slices shown]
[im 1/90]
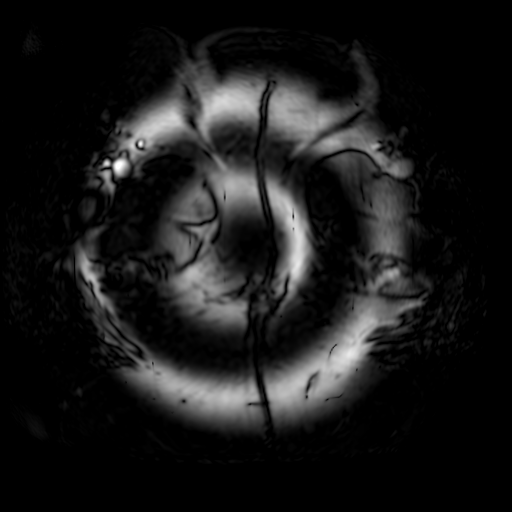
[im 30/90]
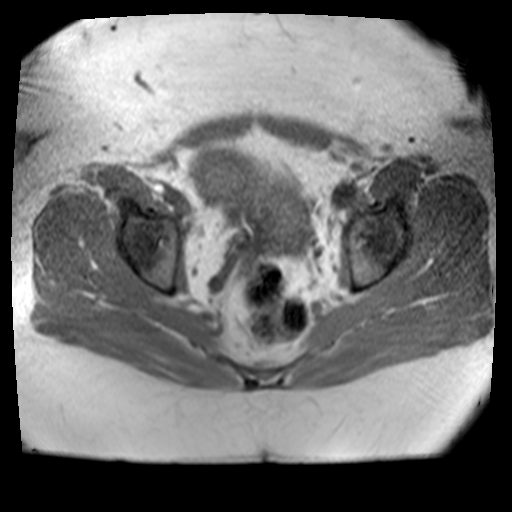
[im 60/90]
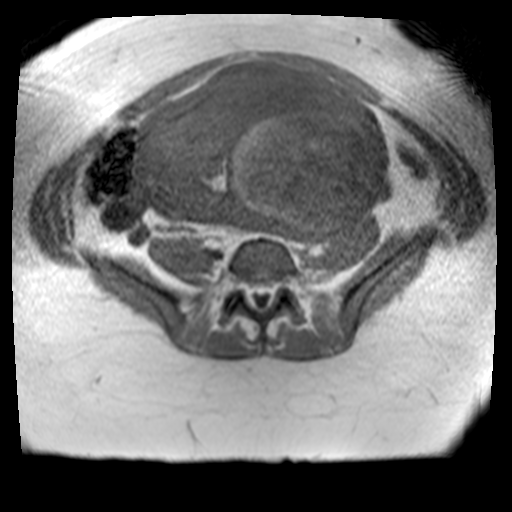
[im 90/90]
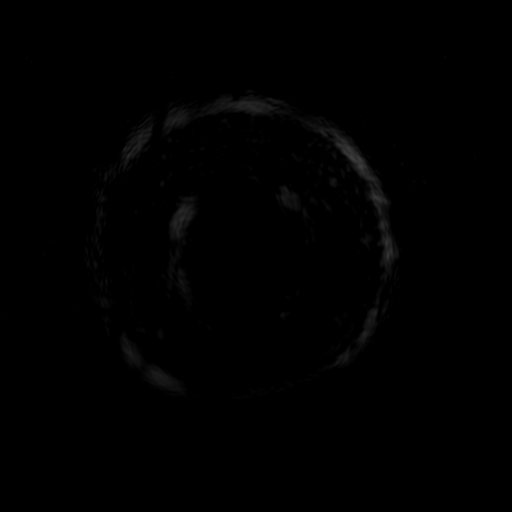

[Series 6: t2_tse axial fs · axial · 5.0mm · 0.68mm/px · z∈[-107,+201]mm · 2 of 45 slices shown]
[im 1/45]
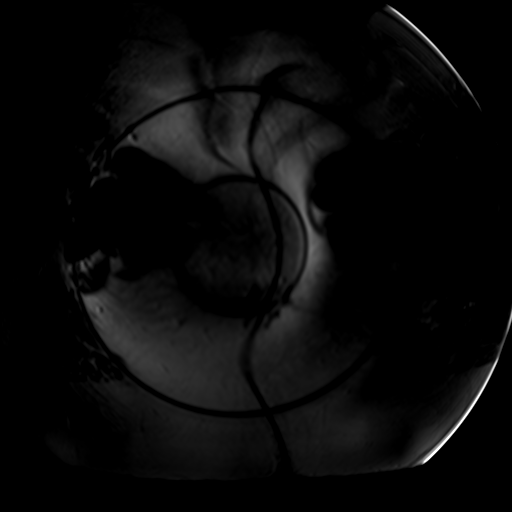
[im 45/45]
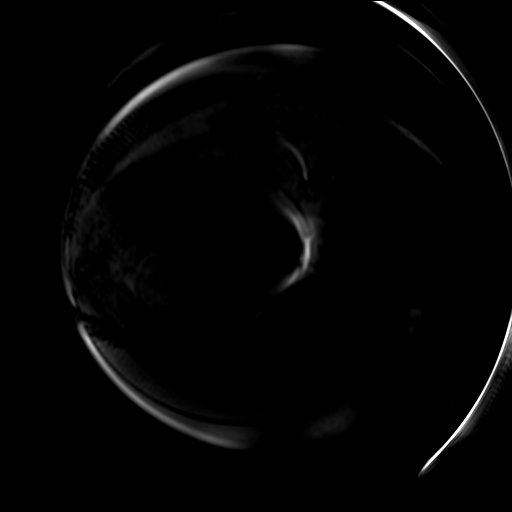

[Series 7: t2_tse axial · axial · 5.0mm · 0.74mm/px · z∈[-107,+201]mm · 2 of 45 slices shown]
[im 1/45]
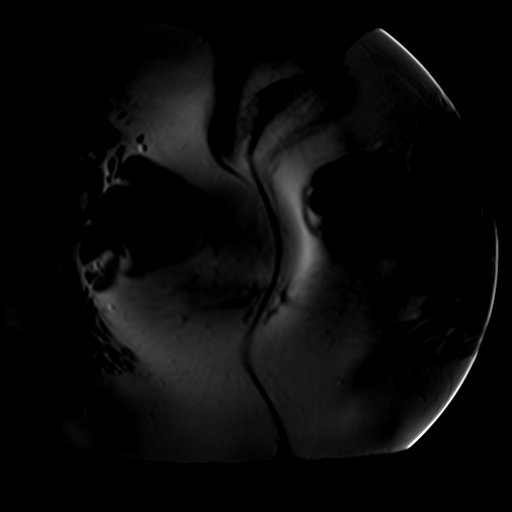
[im 45/45]
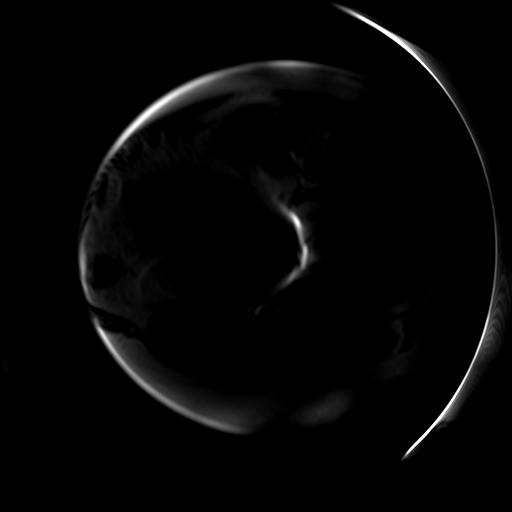

[Series 8: T2 · coronal · 5.0mm · 1.76mm/px · 2 of 45 slices shown]
[im 1/45]
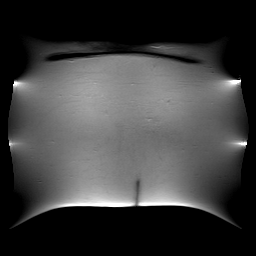
[im 45/45]
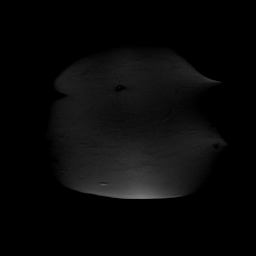

[Series 9: ep2d_diff_b50_500_800_p2 · axial · 8.0mm · 2.08mm/px · z∈[-119,+214]mm · 5 of 99 slices shown]
[im 1/99]
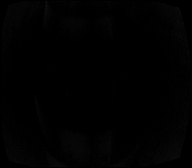
[im 25/99]
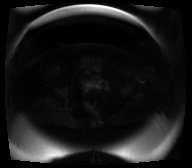
[im 50/99]
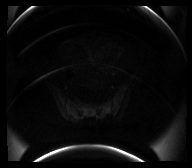
[im 74/99]
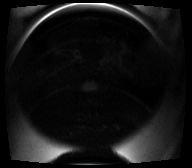
[im 99/99]
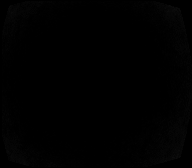

[Series 10: ep2d_diff_b50_500_800_p2_adc · axial · 8.0mm · 2.08mm/px · z∈[-119,+214]mm · 2 of 33 slices shown]
[im 1/33]
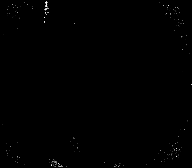
[im 33/33]
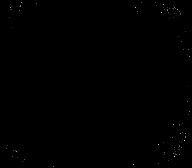

[Series 11: t2_tse_sag · sagittal · 5.0mm · 0.78mm/px · 2 of 34 slices shown]
[im 1/34]
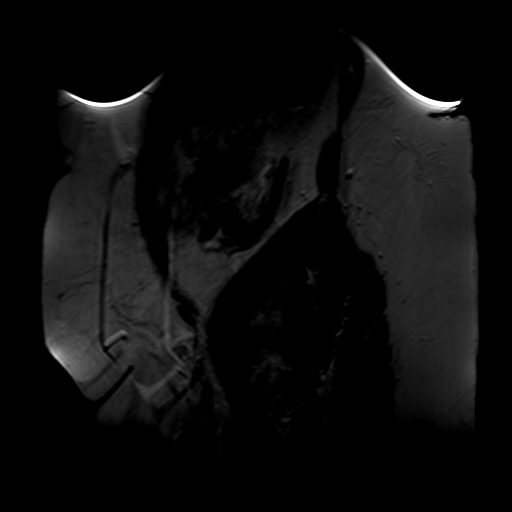
[im 34/34]
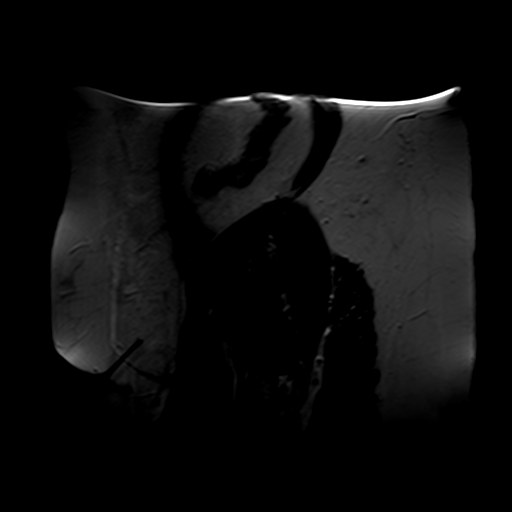

[Series 12: T1 dynamic · axial · non-contrast · 2.0mm · 0.78mm/px · z∈[-100,+172]mm · 7 of 160 slices shown]
[im 1/160]
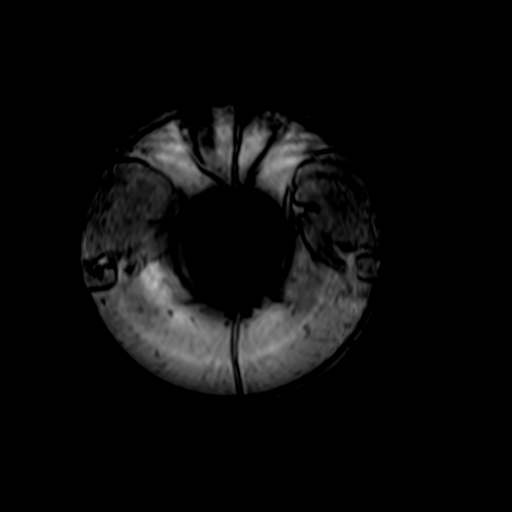
[im 23/160]
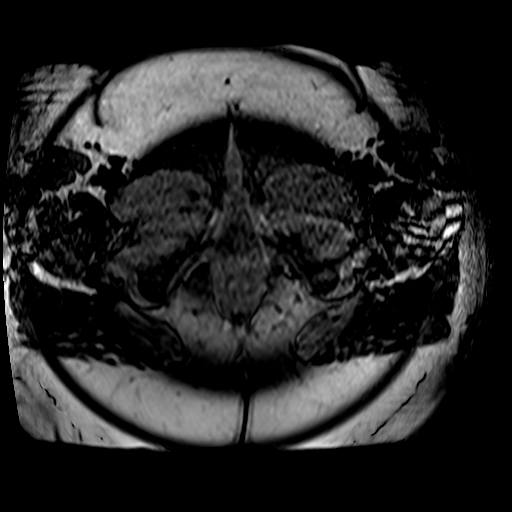
[im 46/160]
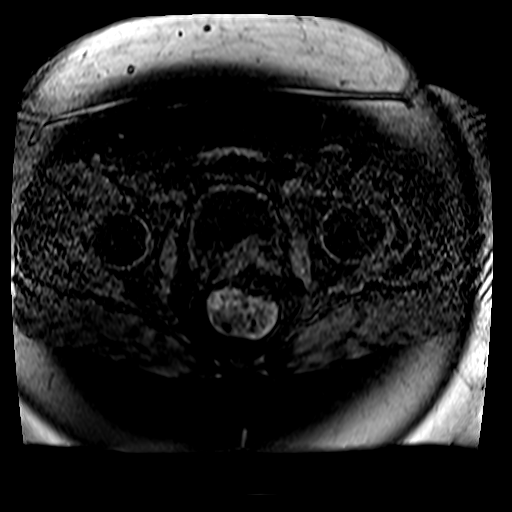
[im 69/160]
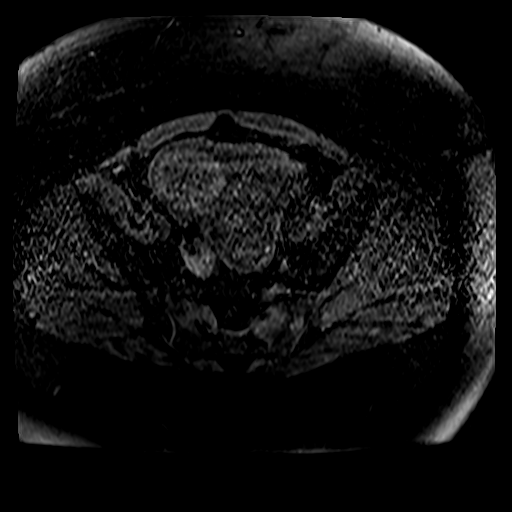
[im 91/160]
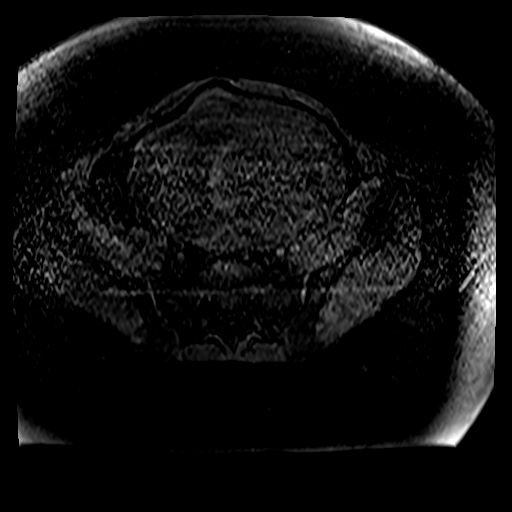
[im 114/160]
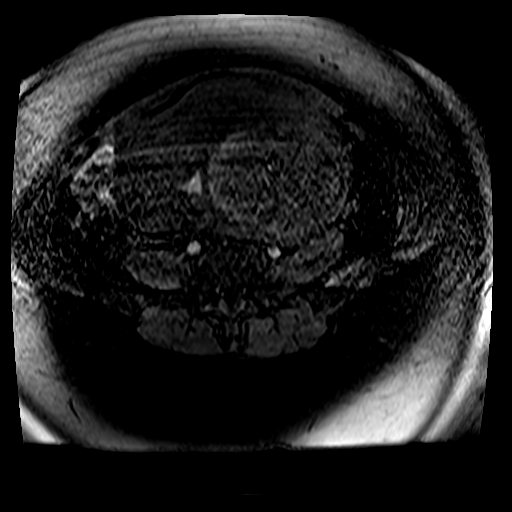
[im 137/160]
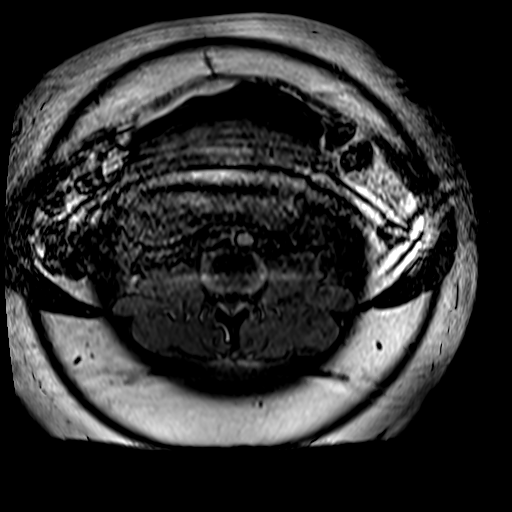

[26 of 48 positions shown; findings below may reference images not displayed]

FINDINGS: Lower Urinary Tract: No urinary bladder or urethral abnormality
identified.

Bowel: Unremarkable pelvic bowel loops.

Vascular/Lymphatic: Unremarkable. No pathologically enlarged pelvic
lymph nodes identified.

Reproductive:

-- Uterus: Measures 23.9 x 13.0 by 18.3 cm (volume = [ZL] cm^3).
Diffuse uterine involvement by numerous fibroids are seen. Largest
fibroid is located in the left uterine corpus and measures 11.6 cm
in maximum diameter. This fibroid shows complete lack contrast
enhancement, consistent with diffuse degeneration. Several other
smaller fibroids also show diffuse or heterogeneous internal
degeneration. No intracavitary or pedunculated fibroids identified.
Cervix and vagina are unremarkable in appearance.

-- Right ovary: Appears normal. 2.7 cm follicle incidentally noted.
No ovarian or adnexal masses identified.

-- Left ovary: Appears normal. No ovarian or adnexal masses
identified.

Other: No peritoneal thickening or abnormal free fluid.

Musculoskeletal:  Unremarkable.
IMPRESSION: Markedly enlarged uterus with diffuse involvement by fibroids,
largest measuring 11.6 cm and showing diffuse degeneration. This
extends into the mid abdomen and accounts for the mass seen on
recent CT.

Normal appearance of both ovaries. No adnexal mass identified.

## 2020-08-06 MED ORDER — GADOBENATE DIMEGLUMINE 529 MG/ML IV SOLN
20.0000 mL | Freq: Once | INTRAVENOUS | Status: AC | PRN
  Administered 2020-08-06: 20 mL via INTRAVENOUS

## 2020-08-07 ENCOUNTER — Encounter: Payer: Self-pay | Admitting: Gynecologic Oncology

## 2020-08-08 ENCOUNTER — Other Ambulatory Visit: Payer: Self-pay

## 2020-08-08 ENCOUNTER — Emergency Department
Admission: EM | Admit: 2020-08-08 | Discharge: 2020-08-08 | Disposition: A | Discharge status: Home / Self Care | Payer: BC Managed Care – PPO | Attending: Emergency Medicine | Admitting: Emergency Medicine

## 2020-08-08 DIAGNOSIS — Z7984 Long term (current) use of oral hypoglycemic drugs: Secondary | ICD-10-CM | POA: Insufficient documentation

## 2020-08-08 DIAGNOSIS — R42 Dizziness and giddiness: Secondary | ICD-10-CM | POA: Diagnosis not present

## 2020-08-08 DIAGNOSIS — I10 Essential (primary) hypertension: Secondary | ICD-10-CM | POA: Insufficient documentation

## 2020-08-08 DIAGNOSIS — Z79899 Other long term (current) drug therapy: Secondary | ICD-10-CM | POA: Insufficient documentation

## 2020-08-08 DIAGNOSIS — Z7901 Long term (current) use of anticoagulants: Secondary | ICD-10-CM | POA: Diagnosis not present

## 2020-08-08 DIAGNOSIS — R519 Headache, unspecified: Secondary | ICD-10-CM | POA: Diagnosis not present

## 2020-08-08 DIAGNOSIS — E119 Type 2 diabetes mellitus without complications: Secondary | ICD-10-CM | POA: Insufficient documentation

## 2020-08-08 LAB — CBC WITH DIFFERENTIAL/PLATELET
Abs Immature Granulocytes: 0.04 10*3/uL (ref 0.00–0.07)
Basophils Absolute: 0.1 10*3/uL (ref 0.0–0.1)
Basophils Relative: 1 %
Eosinophils Absolute: 0.1 10*3/uL (ref 0.0–0.5)
Eosinophils Relative: 1 %
HCT: 29.1 % — ABNORMAL LOW (ref 36.0–46.0)
Hemoglobin: 8.6 g/dL — ABNORMAL LOW (ref 12.0–15.0)
Immature Granulocytes: 1 %
Lymphocytes Relative: 29 %
Lymphs Abs: 2.3 10*3/uL (ref 0.7–4.0)
MCH: 23 pg — ABNORMAL LOW (ref 26.0–34.0)
MCHC: 29.6 g/dL — ABNORMAL LOW (ref 30.0–36.0)
MCV: 77.8 fL — ABNORMAL LOW (ref 80.0–100.0)
Monocytes Absolute: 0.5 10*3/uL (ref 0.1–1.0)
Monocytes Relative: 7 %
Neutro Abs: 5 10*3/uL (ref 1.7–7.7)
Neutrophils Relative %: 61 %
Platelets: 302 10*3/uL (ref 150–400)
RBC: 3.74 MIL/uL — ABNORMAL LOW (ref 3.87–5.11)
RDW: 17 % — ABNORMAL HIGH (ref 11.5–15.5)
WBC: 7.9 10*3/uL (ref 4.0–10.5)
nRBC: 0 % (ref 0.0–0.2)

## 2020-08-08 LAB — BASIC METABOLIC PANEL
Anion gap: 10 (ref 5–15)
BUN: 9 mg/dL (ref 6–20)
CO2: 23 mmol/L (ref 22–32)
Calcium: 8.9 mg/dL (ref 8.9–10.3)
Chloride: 104 mmol/L (ref 98–111)
Creatinine, Ser: 0.59 mg/dL (ref 0.44–1.00)
GFR, Estimated: >60 mL/min (ref >60)
Glucose, Bld: 120 mg/dL — ABNORMAL HIGH (ref 70–99)
Potassium: 4.2 mmol/L (ref 3.5–5.1)
Sodium: 137 mmol/L (ref 135–145)

## 2020-08-08 LAB — URINALYSIS, COMPLETE (UACMP) WITH MICROSCOPIC
Bacteria, UA: NONE SEEN
Bilirubin Urine: NEGATIVE
Glucose, UA: NEGATIVE mg/dL
Ketones, ur: NEGATIVE mg/dL
Leukocytes,Ua: NEGATIVE
Nitrite: NEGATIVE
Protein, ur: 100 mg/dL — AB
Specific Gravity, Urine: 1.021 (ref 1.005–1.030)
pH: 8 (ref 5.0–8.0)

## 2020-08-08 LAB — POC URINE PREG, ED: Preg Test, Ur: NEGATIVE

## 2020-08-08 MED ORDER — PROCHLORPERAZINE MALEATE 10 MG PO TABS: 10.0000 mg | ORAL_TABLET | Freq: Three times a day (TID) | ORAL | 0 refills | Status: DC | PRN

## 2020-08-08 MED ORDER — DIPHENHYDRAMINE HCL 50 MG/ML IJ SOLN
25.0000 mg | Freq: Once | INTRAMUSCULAR | Status: AC
  Administered 2020-08-08: 25 mg via INTRAVENOUS
  Filled 2020-08-08: qty 1

## 2020-08-08 MED ORDER — PROCHLORPERAZINE EDISYLATE 10 MG/2ML IJ SOLN
10.0000 mg | Freq: Once | INTRAMUSCULAR | Status: AC
  Administered 2020-08-08: 10 mg via INTRAVENOUS
  Filled 2020-08-08: qty 2

## 2020-08-08 NOTE — ED Provider Notes (Signed)
[facility] Emergency Department Provider Note   ____________________________________________   Event Date/Time   First MD Initiated Contact with Patient 08/08/20 1050     (approximate)  I have reviewed the triage vital signs and the nursing notes.   HISTORY  Chief Complaint Headache    HPI Heather Cherry is a 35 y.o. 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  Patient reports that she has had increasing headache for the past 1 month that she describes as a constant throbbing.  It does not affect any 1 particular part of her head, is not exacerbated by light or sound.  She is occasionally felt nauseous but has not vomited.  She denies any vision changes, speech changes, numbness, or weakness.  She has not had any fevers or neck stiffness.  She has previously been prescribed Diamox for pseudotumor cerebri which helped in the past but has not recently.  She has not seen a neurologist in a couple of years but does have an appointment scheduled for next week.  She became more concerned overnight because she started feeling dizzy and lightheaded.        Past Medical History:  Diagnosis Date  . Abnormal vaginal bleeding   . Antiphospholipid antibody syndrome ([place])   . Anxiety   . Depression   . Diabetes mellitus without complication ([place])   . DVT (deep venous thrombosis) ([place])   . Fibroid   . Graves disease   . Hypertension   . Obesity   . Pulmonary emboli ([place])   . Sinus congestion   . Sleep apnea     Patient Active Problem List   Diagnosis Date Noted  . Intramural leiomyoma of uterus 07/31/2020  . Pelvic mass 07/31/2020  . Diabetes mellitus without complication ([place]) 09/60/4540  . DVT (deep vein thrombosis) in pregnancy 03/26/2016  . Hypokalemia 03/26/2016  . Anemia 03/26/2016  . Morbid obesity ([place]) 03/26/2016  .  Pulmonary embolism ([place]) 03/24/2016  . Pseudotumor cerebri 03/19/2016  . Pleuritic chest pain 03/18/2016  . Chest pain 03/18/2016  . Right pulmonary embolus ([place]) 03/17/2016  . PE (pulmonary thromboembolism) ([place]) 03/12/2016  . DVT (deep venous thrombosis) ([place]) 03/12/2016  . HTN (hypertension) 03/12/2016  . Depression 03/12/2016  . Anxiety 03/12/2016  . GERD (gastroesophageal reflux disease) 03/12/2016  . Hyperglycemia 03/12/2016    Past Surgical History:  Procedure Laterality Date  . CHOLECYSTECTOMY    . COLONOSCOPY    . IVC FILTER REMOVAL N/A 04/29/2017   Procedure: IVC FILTER REMOVAL;  Surgeon: Algernon Huxley, MD;  Location: [place] [facility];  Service: Cardiovascular;  Laterality: N/A;  . PERIPHERAL VASCULAR CATHETERIZATION N/A 03/25/2016   Procedure: IVC Filter Insertion;  Surgeon: Katha Cabal, MD;  Location: [place] [facility];  Service: Cardiovascular;  Laterality: N/A;  . WISDOM TOOTH EXTRACTION      Prior to Admission medications   Medication Sig Start Date End Date Taking? Authorizing Provider  acetaminophen (TYLENOL) 500 MG tablet Take 1,000 mg by mouth every 6 (six) hours as needed for moderate pain or headache.     [provider]  acetaZOLAMIDE (DIAMOX) 250 MG tablet Take 250 mg by mouth daily.    [provider]  amLODipine (NORVASC) 5 MG tablet Take 5 mg by mouth daily.    [provider]  DULoxetine (CYMBALTA) 30 MG capsule Take 30 mg by mouth daily.  [provider]  enoxaparin (LOVENOX) 80 MG/0.8ML injection Inject 160 mg into the skin every 12 (twelve) hours.    [provider]  ferrous sulfate 325 (65 FE) MG tablet Take 325 mg by mouth 3 (three) times daily with meals.    [provider]  HYDROcodone-acetaminophen (NORCO) 5-325 MG tablet Take 1 tablet by mouth every 4 (four) hours as needed for moderate pain. 07/27/20   Naaman Plummer, MD  metFORMIN (GLUCOPHAGE-XR) 500 MG 24 hr tablet Take 500 mg  by mouth daily with breakfast.    [provider]  methimazole (TAPAZOLE) 10 MG tablet Take 10-20 mg by mouth See admin instructions. Take 2 tablets (20mg ) by mouth every morning and take 1 tablet (10mg ) by mouth every evening    [provider]  metoprolol succinate (TOPROL-XL) 100 MG 24 hr tablet Take 100 mg by mouth daily.    [provider]  omeprazole (PRILOSEC) 20 MG capsule Take 20 mg by mouth daily.    [provider]  prochlorperazine (COMPAZINE) 10 MG tablet Take 1 tablet (10 mg total) by mouth every 8 (eight) hours as needed for nausea or vomiting (or headache). 08/08/20   Blake Divine, MD  rizatriptan (MAXALT) 10 MG tablet Take 10 mg by mouth as needed for migraine. May repeat in 2 hours if needed    [provider]    Allergies Lisinopril  Family History  Problem Relation Age of Onset  . Cancer Paternal Grandfather   . Breast cancer Paternal Grandmother   . Rheumatologic disease Neg Hx   . Colon cancer Neg Hx   . Endometrial cancer Neg Hx   . Prostate cancer Neg Hx   . Pancreatic cancer Neg Hx   . Ovarian cancer Neg Hx     Social History Social History   Tobacco Use  . Smoking status: Never Smoker  . Smokeless tobacco: Never Used  Vaping Use  . Vaping Use: Never used  Substance Use Topics  . Alcohol use: Yes    Comment: occas. social  . Drug use: No    Review of Systems  Constitutional: No fever/chills.  Positive for dizziness. Eyes: No visual changes. ENT: No sore throat. Cardiovascular: Denies chest pain. Respiratory: Denies shortness of breath. Gastrointestinal: No abdominal pain.  No nausea, no vomiting.  No diarrhea.  No constipation. Genitourinary: Negative for dysuria. Musculoskeletal: Negative for back pain. Skin: Negative for rash. Neurological: Positive for headaches, negative for focal weakness or numbness.  ____________________________________________   PHYSICAL EXAM:  VITAL SIGNS: ED  Triage Vitals  Enc Vitals Group     BP 08/08/20 1054 (!) 150/86     Pulse Rate 08/08/20 1054 90     Resp 08/08/20 1054 16     Temp 08/08/20 1054 98.1 F (36.7 C)     Temp Source 08/08/20 1054 Oral     SpO2 08/08/20 1054 98 %     Weight 08/08/20 1052 (!) 359 lb 5.6 oz (163 kg)     Height 08/08/20 1052 5\' 9"  (1.753 m)     Head Circumference --      Peak Flow --      Pain Score 08/08/20 1052 9     Pain Loc --      Pain Edu? --      Excl. in [place]? --     Constitutional: Alert and oriented. Eyes: Conjunctivae are normal.  Pupils equal round and reactive to light bilaterally. Head: Atraumatic. Nose: No congestion/rhinnorhea. Mouth/Throat: Mucous  membranes are moist. Neck: Normal ROM, no meningismus. Cardiovascular: Normal rate, regular rhythm. Grossly normal heart sounds. Respiratory: Normal respiratory effort.  No retractions. Lungs CTAB. Gastrointestinal: Soft and nontender. No distention. Genitourinary: deferred Musculoskeletal: No lower extremity tenderness nor edema. Neurologic:  Normal speech and language. No gross focal neurologic deficits are appreciated. Skin:  Skin is warm, dry and intact. No rash noted. Psychiatric: Mood and affect are normal. Speech and behavior are normal.  ____________________________________________   LABS (all labs ordered are listed, but only abnormal results are displayed)  Labs Reviewed  URINALYSIS, COMPLETE (UACMP) WITH MICROSCOPIC - Abnormal; Notable for the following components:      Result Value   Color, Urine YELLOW (*)    APPearance HAZY (*)    Hgb urine dipstick MODERATE (*)    Protein, ur 100 (*)    All other components within normal limits  CBC WITH DIFFERENTIAL/PLATELET - Abnormal; Notable for the following components:   RBC 3.74 (*)    Hemoglobin 8.6 (*)    HCT 29.1 (*)    MCV 77.8 (*)    MCH 23.0 (*)    MCHC 29.6 (*)    RDW 17.0 (*)    All other components within normal limits  BASIC METABOLIC PANEL - Abnormal; Notable  for the following components:   Glucose, Bld 120 (*)    All other components within normal limits  POC URINE PREG, ED   ____________________________________________  EKG  ED ECG REPORT I, Blake Divine, the attending physician, personally viewed and interpreted this ECG.   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  Symptoms do not seem consistent with SAH or meningitis.  She has no focal neurologic deficits on exam and denies any visual symptoms.  We will further assess with labs and EKG, treat with headache cocktail and reassess.  Given lack of focal neurologic findings, there is no indication for LP at this time.  Labs are unremarkable, pregnancy testing is negative.  Patient reports feeling better following migraine cocktail and she is appropriate for discharge home with neurology follow-up.  She was counseled to return to the ED for new worsening symptoms, patient agrees with plan.      ____________________________________________   FINAL CLINICAL IMPRESSION(S) / ED DIAGNOSES  Final diagnoses:  Acute nonintractable headache, unspecified headache type     ED Discharge Orders         Ordered    prochlorperazine (COMPAZINE) 10 MG tablet  Every 8 hours PRN,   Status:  Discontinued        08/08/20 1303    prochlorperazine (COMPAZINE) 10 MG tablet  Every 8 hours PRN        08/08/20 1303           Note:  This document was prepared using Dragon voice  recognition software and may include unintentional dictation errors.   Blake Divine, MD 08/08/20 1304

## 2020-08-08 NOTE — ED Notes (Signed)
See triage note  Presents with h/a and dizziness   States sx's started about 4 days ago  No fever or trauma

## 2020-08-08 NOTE — ED Triage Notes (Signed)
Headache x 1 month with dizziness that started over last 3 days. Pt alert and oriented X4, cooperative, RR even and unlabored, color WNL. Pt in NAD.

## 2020-08-09 ENCOUNTER — Other Ambulatory Visit: Payer: BC Managed Care – PPO

## 2020-08-09 ENCOUNTER — Ambulatory Visit (HOSPITAL_COMMUNITY): Payer: BC Managed Care – PPO

## 2020-08-28 ENCOUNTER — Telehealth: Payer: Self-pay | Admitting: *Deleted

## 2020-08-28 NOTE — Telephone Encounter (Signed)
Fax records to [facility]

## 2020-09-03 ENCOUNTER — Telehealth: Payer: Self-pay | Admitting: *Deleted

## 2020-09-03 NOTE — Telephone Encounter (Signed)
Called and left a message at [facility] regarding the referral

## 2020-09-10 ENCOUNTER — Telehealth: Payer: Self-pay | Admitting: *Deleted

## 2020-09-10 NOTE — Telephone Encounter (Signed)
[facility] called and gave 8/25 virtual appt, patient already aware

## 2021-05-26 ENCOUNTER — Other Ambulatory Visit: Payer: Self-pay

## 2021-05-26 ENCOUNTER — Emergency Department: Payer: BC Managed Care – PPO

## 2021-05-26 ENCOUNTER — Emergency Department
Admission: EM | Admit: 2021-05-26 | Discharge: 2021-05-26 | Disposition: A | Discharge status: Home / Self Care | Payer: BC Managed Care – PPO | Attending: Emergency Medicine | Admitting: Emergency Medicine

## 2021-05-26 DIAGNOSIS — S20212A Contusion of left front wall of thorax, initial encounter: Secondary | ICD-10-CM | POA: Diagnosis not present

## 2021-05-26 DIAGNOSIS — Y92410 Unspecified street and highway as the place of occurrence of the external cause: Secondary | ICD-10-CM | POA: Insufficient documentation

## 2021-05-26 DIAGNOSIS — S299XXA Unspecified injury of thorax, initial encounter: Secondary | ICD-10-CM | POA: Diagnosis present

## 2021-05-26 LAB — BASIC METABOLIC PANEL
Anion gap: 6 (ref 5–15)
BUN: 11 mg/dL (ref 6–20)
CO2: 24 mmol/L (ref 22–32)
Calcium: 8.9 mg/dL (ref 8.9–10.3)
Chloride: 108 mmol/L (ref 98–111)
Creatinine, Ser: 0.59 mg/dL (ref 0.44–1.00)
GFR, Estimated: >60 mL/min (ref >60)
Glucose, Bld: 100 mg/dL — ABNORMAL HIGH (ref 70–99)
Potassium: 4 mmol/L (ref 3.5–5.1)
Sodium: 138 mmol/L (ref 135–145)

## 2021-05-26 LAB — CBC
HCT: 41.5 % (ref 36.0–46.0)
Hemoglobin: 12.2 g/dL (ref 12.0–15.0)
MCH: 26 pg (ref 26.0–34.0)
MCHC: 29.4 g/dL — ABNORMAL LOW (ref 30.0–36.0)
MCV: 88.3 fL (ref 80.0–100.0)
Platelets: 248 10*3/uL (ref 150–400)
RBC: 4.7 MIL/uL (ref 3.87–5.11)
RDW: 17.1 % — ABNORMAL HIGH (ref 11.5–15.5)
WBC: 8 10*3/uL (ref 4.0–10.5)
nRBC: 0 % (ref 0.0–0.2)

## 2021-05-26 LAB — TROPONIN I (HIGH SENSITIVITY): Troponin I (High Sensitivity): 3 ng/L (ref <18)
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
## 2021-05-26 MED ORDER — METHOCARBAMOL 500 MG PO TABS: 500.0000 mg | ORAL_TABLET | Freq: Four times a day (QID) | ORAL | 0 refills | Status: DC

## 2021-05-26 MED ORDER — ORPHENADRINE CITRATE 30 MG/ML IJ SOLN
60.0000 mg | Freq: Once | INTRAMUSCULAR | Status: AC
  Administered 2021-05-26: 60 mg via INTRAMUSCULAR
  Filled 2021-05-26: qty 2

## 2021-05-26 MED ORDER — DEXAMETHASONE SODIUM PHOSPHATE 10 MG/ML IJ SOLN
10.0000 mg | Freq: Once | INTRAMUSCULAR | Status: AC
  Administered 2021-05-26: 10 mg via INTRAMUSCULAR
  Filled 2021-05-26: qty 1

## 2021-05-26 MED ORDER — PROMETHAZINE HCL 25 MG PO TABS
25.0000 mg | ORAL_TABLET | Freq: Once | ORAL | Status: AC
  Administered 2021-05-26: 25 mg via ORAL
  Filled 2021-05-26: qty 1

## 2021-05-26 MED ORDER — PREDNISONE 50 MG PO TABS: 50.0000 mg | ORAL_TABLET | Freq: Every day | ORAL | 0 refills | Status: DC
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
## 2021-06-12 ENCOUNTER — Other Ambulatory Visit: Payer: Self-pay | Admitting: Chiropractic Medicine

## 2021-06-12 DIAGNOSIS — M545 Low back pain, unspecified: Secondary | ICD-10-CM

## 2021-06-19 ENCOUNTER — Other Ambulatory Visit: Payer: Self-pay

## 2021-06-19 ENCOUNTER — Ambulatory Visit
Admission: RE | Admit: 2021-06-19 | Discharge: 2021-06-19 | Disposition: A | Discharge status: Home / Self Care | Payer: Self-pay | Source: Ambulatory Visit | Attending: Chiropractic Medicine | Admitting: Chiropractic Medicine

## 2021-06-19 DIAGNOSIS — M545 Low back pain, unspecified: Secondary | ICD-10-CM

## 2021-06-19 IMAGING — MR MR LUMBAR SPINE W/O CM
4 of 5 series · 26 of 48 positions shown · non-contrast
Comparison: None.

CLINICAL DATA: Back and shoulder pain.

EXAM:
MRI LUMBAR SPINE WITHOUT CONTRAST
TECHNIQUE: Multiplanar, multisequence MR imaging of the lumbar spine was
performed. No intravenous contrast was administered.

[Series 3: T2 · sagittal · 4.0mm · 0.59mm/px · 6 of 15 slices shown (1 of 2)]
[im 1/15]
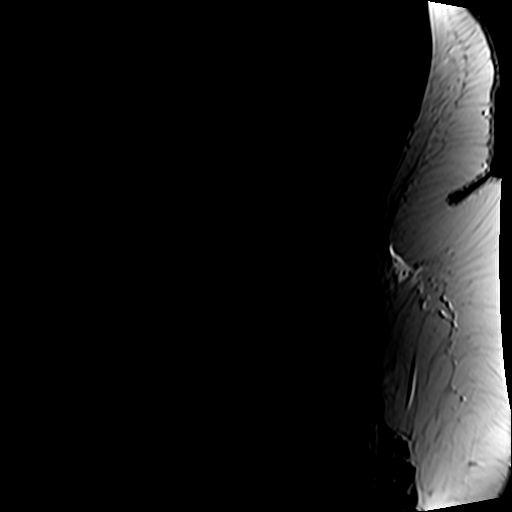
[im 3/15]
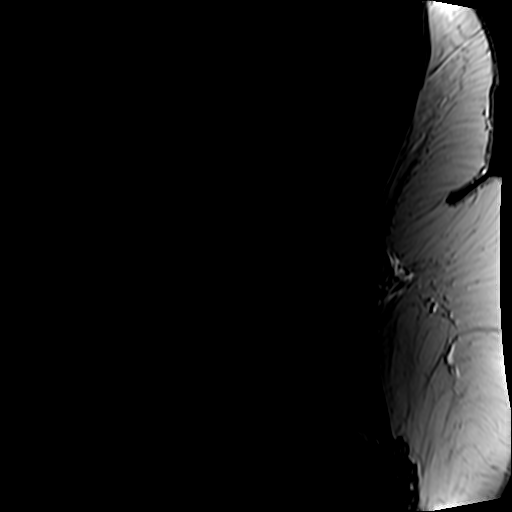
[im 6/15]
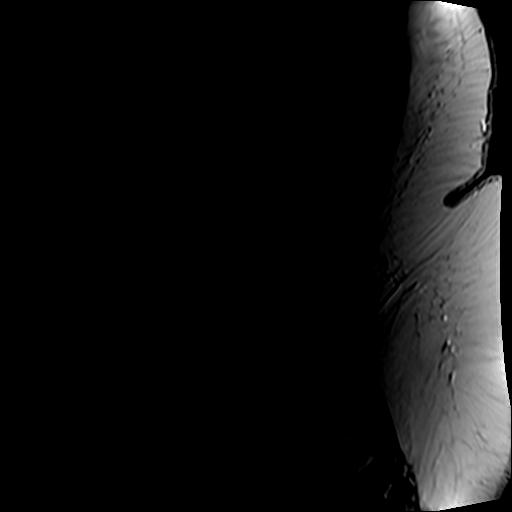
[im 9/15]
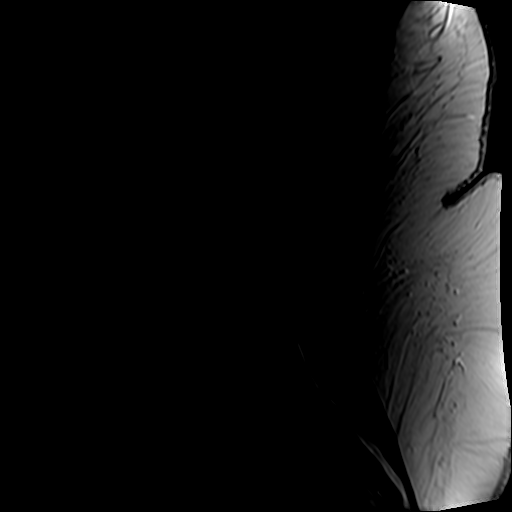
[im 12/15]
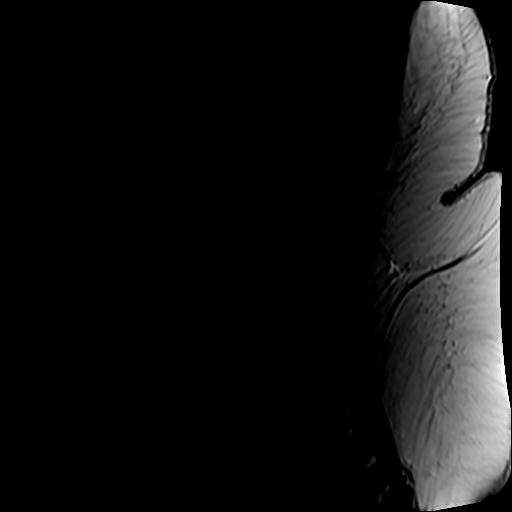
[im 15/15]
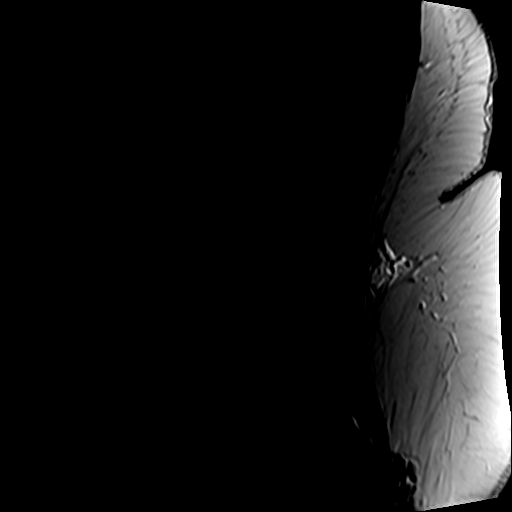

[Series 5: T1 · sagittal · 4.0mm · 0.59mm/px · 6 of 15 slices shown (1 of 2)]
[im 1/15]
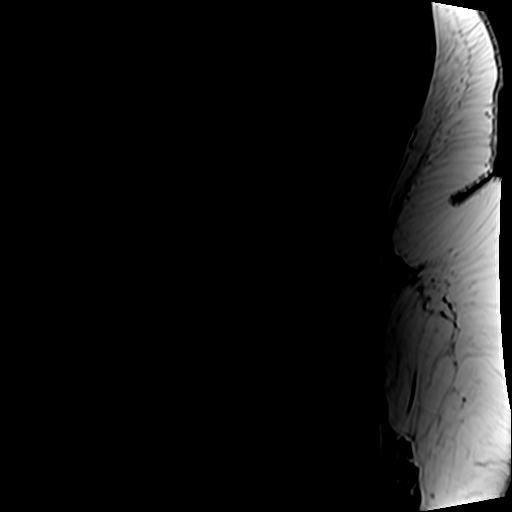
[im 3/15]
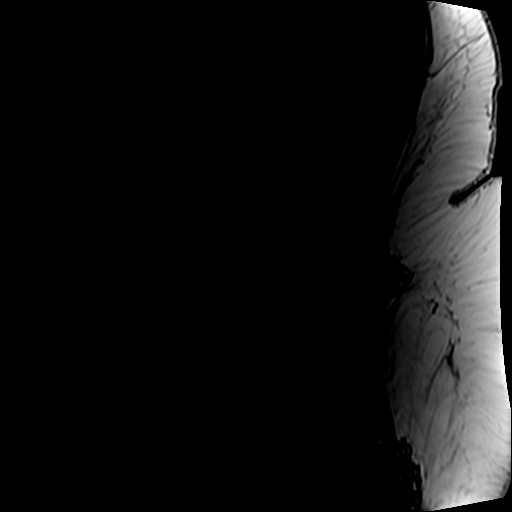
[im 6/15]
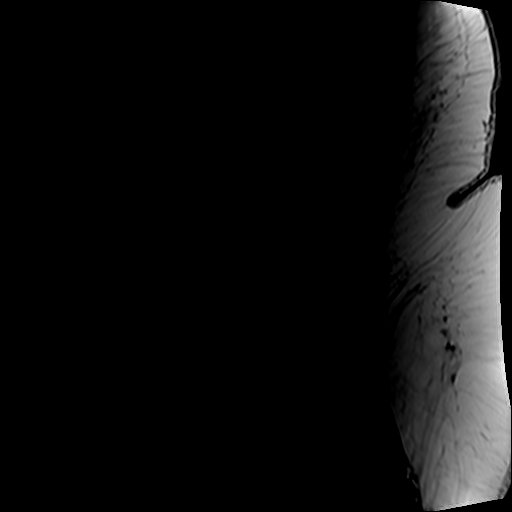
[im 9/15]
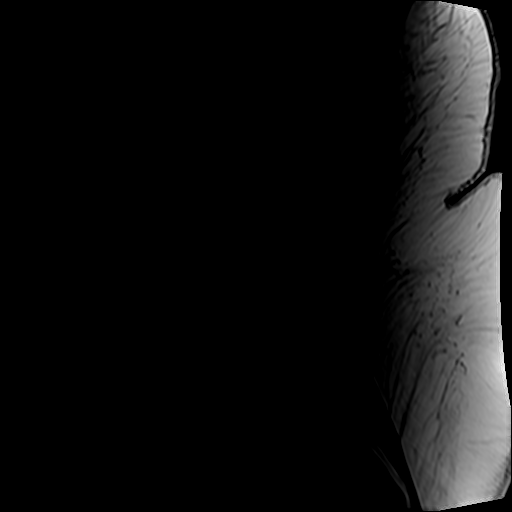
[im 12/15]
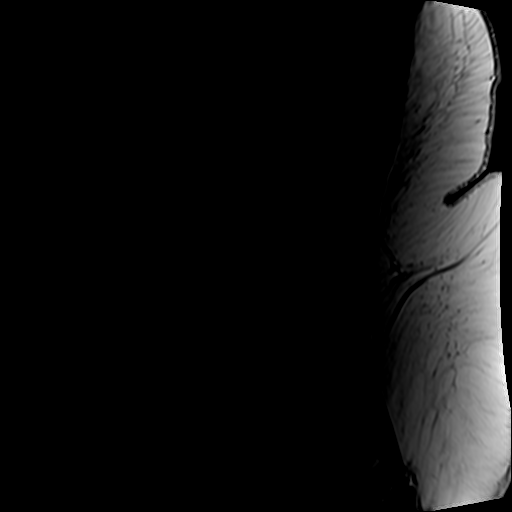
[im 15/15]
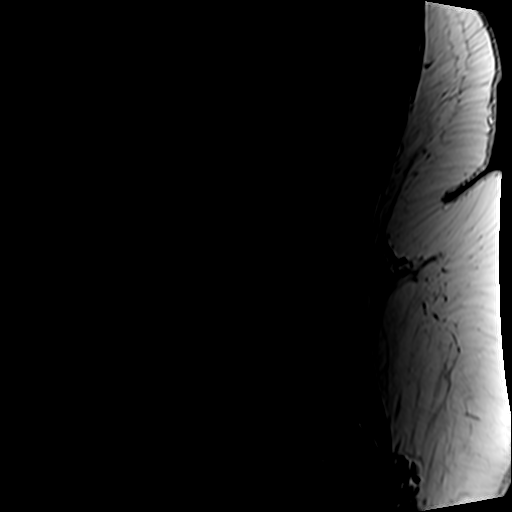

[Series 6: T2 · axial · 4.0mm · 0.70mm/px · z∈[-158,+62]mm · 9 of 39 slices shown (2 of 2)]
[im 1/39]
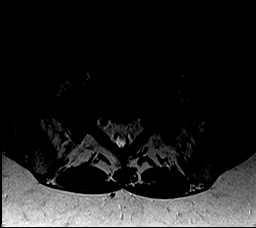
[im 6/39]
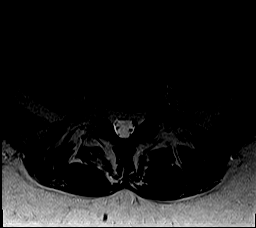
[im 11/39]
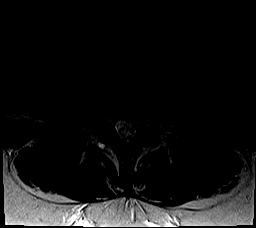
[im 17/39]
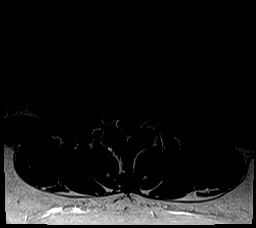
[im 20/39]
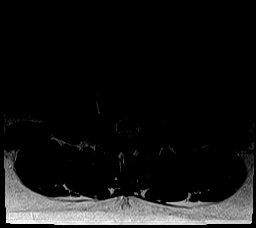
[im 22/39]
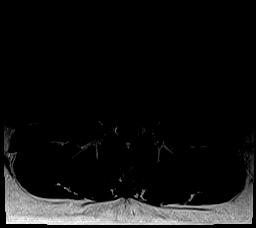
[im 28/39]
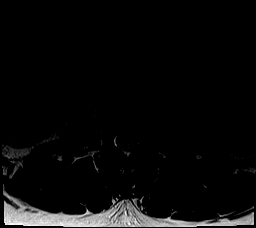
[im 33/39]
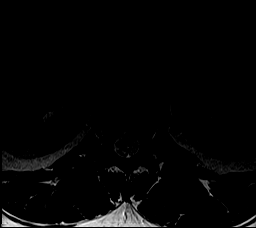
[im 39/39]
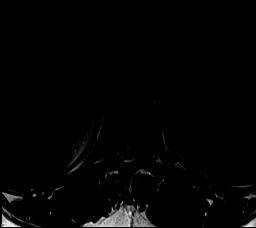

[Series 7: T1 · axial · 4.0mm · 0.35mm/px · z∈[-158,+31]mm · 5 of 39 slices shown (2 of 2)]
[im 1/39]
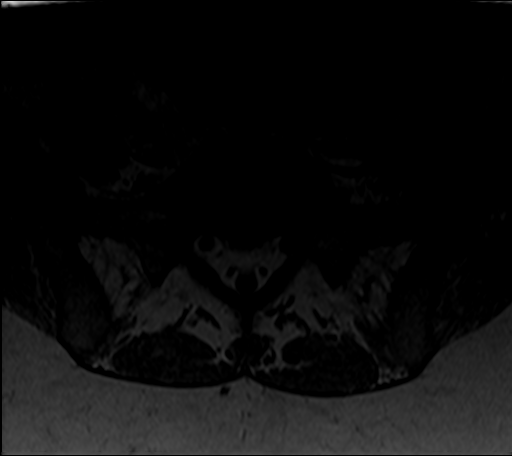
[im 6/39]
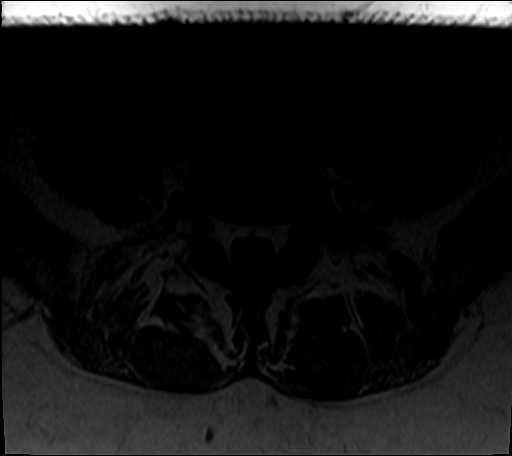
[im 11/39]
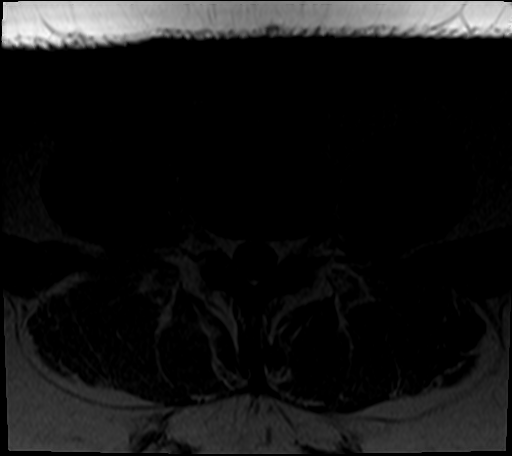
[im 20/39]
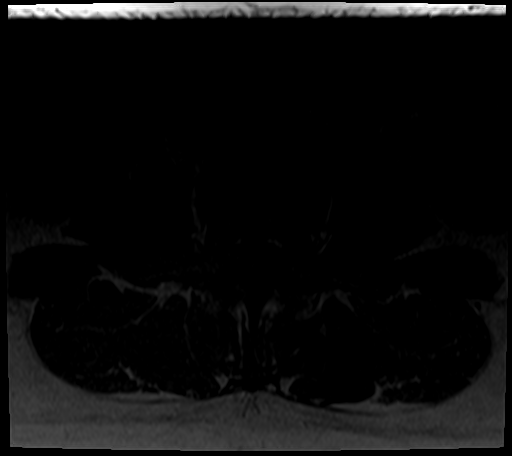
[im 33/39]
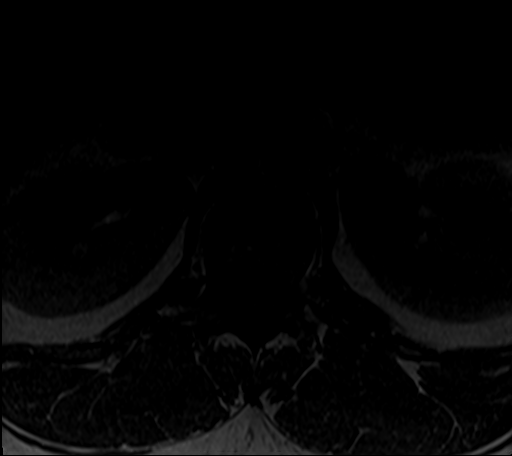

[26 of 48 positions shown; findings below may reference images not displayed]

FINDINGS: Segmentation:  Standard.

Alignment:  Physiologic.

Vertebrae: No acute fracture, evidence of discitis, or aggressive
bone lesion.

Conus medullaris and cauda equina: Conus extends to the T12-L1
level. Conus and cauda equina appear normal.

Paraspinal and other soft tissues: No acute paraspinal abnormality.
Partially visualized markedly enlarged uterus with numerous
fibroids.

Disc levels:

Disc spaces: Disc desiccation mild disc height loss at T12-L1.
Degenerative disease with disc desiccation and minimal disc height
loss at L5-S1.

T12-L1: No significant disc bulge. No neural foraminal stenosis. No
central canal stenosis.

L1-L2: No significant disc bulge. No neural foraminal stenosis. No
central canal stenosis. Mild bilateral facet arthropathy.

L2-L3: No significant disc bulge. No neural foraminal stenosis. No
central canal stenosis. Mild bilateral facet arthropathy.

L3-L4: No significant disc bulge. No neural foraminal stenosis. No
central canal stenosis. Mild bilateral facet arthropathy.

L4-L5: No significant disc bulge. No neural foraminal stenosis. No
central canal stenosis. Mild bilateral facet arthropathy.

L5-S1: No significant disc bulge. No neural foraminal stenosis. No
central canal stenosis. Mild bilateral facet arthropathy.
IMPRESSION: 1. No significant lumbar spine disc protrusion, foraminal stenosis
or central canal stenosis.
2. Mild bilateral facet arthropathy throughout the lumbar spine.
3. Partially visualized markedly enlarged uterus with numerous
fibroids.

## 2022-01-08 ENCOUNTER — Ambulatory Visit (INDEPENDENT_AMBULATORY_CARE_PROVIDER_SITE_OTHER): Payer: BC Managed Care – PPO

## 2022-01-08 ENCOUNTER — Encounter: Payer: Self-pay | Admitting: Podiatry

## 2022-01-08 ENCOUNTER — Ambulatory Visit (INDEPENDENT_AMBULATORY_CARE_PROVIDER_SITE_OTHER): Payer: BC Managed Care – PPO | Admitting: Podiatry

## 2022-01-08 DIAGNOSIS — M722 Plantar fascial fibromatosis: Secondary | ICD-10-CM

## 2022-01-08 MED ORDER — TRIAMCINOLONE ACETONIDE 10 MG/ML IJ SUSP
10.0000 mg | Freq: Once | INTRAMUSCULAR | Status: AC
  Administered 2022-01-08: 10 mg

## 2022-01-08 NOTE — Patient Instructions (Signed)

## 2022-01-09 NOTE — Progress Notes (Signed)
Subjective:   Patient ID: Heather Cherry, female   DOB: 36 y.o.   MRN: [id number]   HPI Patient presents with right heel pain x6 months which is worsened over the last period of time.  Sharp cramping pain with stiffness and has started develop some discomfort left but it is more compensatory with obesity is complicating factor and the fact that the patient does have significant history of blood clots and is on Lovenox to try to control.  Patient does not smoke cannot be active currently but wants to be active to lose weight   Review of Systems  All other systems reviewed and are negative.       Objective:  Physical Exam Vitals and nursing note reviewed.  Constitutional:      Appearance: She is well-developed.  Pulmonary:     Effort: Pulmonary effort is normal.  Musculoskeletal:        General: Normal range of motion.  Skin:    General: Skin is warm.  Neurological:     Mental Status: She is alert.     Neurovascular status found to be intact muscle strength found to be adequate range of motion within normal limits.  Exquisite discomfort into the medial fascial band right heel extending into the arch which is most likely compensatory with minimal pain left and no indications of other pathology.  Good digital perfusion well oriented     Assessment:  Inflammatory fasciitis right plantar fascia along with arch and forefoot pain which is compensatory and mild ankle pain left probable compensatory      Plan:  H&P x-rays reviewed all conditions discussed.  At this point I did sterile prep I injected the medial band of the plantar fascia right 3 mg Kenalog 5 mg Xylocaine applied fascial brace to lift up the arch gave instructions for stretch reappoint to recheck and discuss possible long-term orthotics.  We will look at the left one depending on the response to right but I do not think treatment will be necessary and patient will be seen back in 2 weeks  X-rays indicate that there is spur  plantar heel moderate depression of the arch no indication stress fracture arthritis currently bilateral

## 2022-01-14 ENCOUNTER — Encounter: Payer: Self-pay | Admitting: Podiatry

## 2022-01-22 ENCOUNTER — Ambulatory Visit (INDEPENDENT_AMBULATORY_CARE_PROVIDER_SITE_OTHER): Payer: BC Managed Care – PPO

## 2022-01-22 ENCOUNTER — Encounter: Payer: Self-pay | Admitting: Podiatry

## 2022-01-22 ENCOUNTER — Ambulatory Visit: Payer: BC Managed Care – PPO | Admitting: Podiatry

## 2022-01-22 DIAGNOSIS — R52 Pain, unspecified: Secondary | ICD-10-CM | POA: Diagnosis not present

## 2022-01-22 DIAGNOSIS — M722 Plantar fascial fibromatosis: Secondary | ICD-10-CM | POA: Diagnosis not present

## 2022-01-22 MED ORDER — TRIAMCINOLONE ACETONIDE 10 MG/ML IJ SUSP
10.0000 mg | Freq: Once | INTRAMUSCULAR | Status: AC
  Administered 2022-01-22: 10 mg

## 2022-01-25 NOTE — Progress Notes (Signed)
Subjective:   Patient ID: Heather Cherry, female   DOB: 36 y.o.   MRN: [id number]   HPI Patient presents stating she is still having a lot of pain in her heel and does take Lovenox for history of blood clots which keeps her blood thinner and   ROS      Objective:  Physical Exam  Neurovascular status was found to be intact patient is obese which is complicating factor with exquisite discomfort still noted plantar aspect right heel at the insertional point of the tendon into the calcaneus with fluid buildup around the medial band of the fascia     Assessment:  Acute Planter fasciitis right with patient I would desperately like to avoid surgery on given history of blood clots     Plan:  H&P reviewed condition and due to that she is on Lovenox I believe immobilization will do okay for her and I did explain note to watch carefully for any swelling in her leg or other pathology.  She is fitted for a below the knee air fracture walker to offload all weight on the heel and I did go ahead today and I did do sterile prep and reinjected the fascia 3 mg Kenalog 5 mg Xylocaine applied dressing.  I did explain if any issues were to occur prior to 3 weeks I would like to see her right away hopefully this will get her symptoms under control

## 2022-02-02 ENCOUNTER — Telehealth: Payer: Self-pay | Admitting: *Deleted

## 2022-02-02 NOTE — Telephone Encounter (Signed)
Fine, should be returned for refund company

## 2022-02-02 NOTE — Telephone Encounter (Signed)
Patient is calling because her boot pump is broken, too large, would like a replacement.

## 2022-02-04 ENCOUNTER — Telehealth: Payer: Self-pay | Admitting: *Deleted

## 2022-02-04 NOTE — Telephone Encounter (Signed)
Patient came in and exchanged the cam walking boot (L) one that she had was falling apart-02/04/22.

## 2022-02-04 NOTE — Telephone Encounter (Signed)
Pt stated No one has updated her on her last concern for her boot. Pt is in a lot of pain and boot is broken. I told Pt to bring the boot back to be replaced. Pt will be here today before 5pm

## 2022-02-09 ENCOUNTER — Other Ambulatory Visit: Payer: Self-pay | Admitting: Podiatry

## 2022-02-09 DIAGNOSIS — M722 Plantar fascial fibromatosis: Secondary | ICD-10-CM

## 2022-02-11 ENCOUNTER — Ambulatory Visit: Payer: BC Managed Care – PPO | Admitting: Podiatry

## 2022-02-11 ENCOUNTER — Encounter: Payer: Self-pay | Admitting: Podiatry

## 2022-02-11 DIAGNOSIS — M722 Plantar fascial fibromatosis: Secondary | ICD-10-CM

## 2022-02-11 NOTE — Progress Notes (Signed)
Subjective:   Patient ID: Heather Cherry, female   DOB: 36 y.o.   MRN: [id number]   HPI Patient presents stating I have improved quite a bit at this time mild discomfort still noted if I do too much   ROS      Objective:  Physical Exam  Neuro vas scaler status intact with obese female who does have diminished discomfort right plantar fascia with boot usage     Assessment:  Proving with fasciitis still has some discomfort but much better     Plan:  Slowly wean off boot over the next few weeks advised on stretch therapy and that ultimately orthotics may be necessary or other treatment.  Encouraged weight loss

## 2022-03-12 ENCOUNTER — Ambulatory Visit
Admission: EM | Admit: 2022-03-12 | Discharge: 2022-03-12 | Disposition: A | Discharge status: Home / Self Care | Payer: BC Managed Care – PPO | Attending: Urgent Care | Admitting: Urgent Care

## 2022-03-12 DIAGNOSIS — J029 Acute pharyngitis, unspecified: Secondary | ICD-10-CM | POA: Diagnosis not present

## 2022-03-12 LAB — POCT RAPID STREP A (OFFICE): Rapid Strep A Screen: NEGATIVE

## 2022-03-12 MED ORDER — AMOXICILLIN 500 MG PO CAPS: 500.0000 mg | ORAL_CAPSULE | Freq: Two times a day (BID) | ORAL | 0 refills | Status: AC

## 2022-03-12 NOTE — ED Triage Notes (Signed)
Pt. Presents to UC w/ c/o of a sore throat that started this morning.

## 2022-03-12 NOTE — Discharge Instructions (Addendum)
We are treating you today presumptively for bacterial pharyngitis though our in clinic test was negative.  Setting the sample to lab for culture to confirm bacteria present.  You will be contacted when the culture is mature.

## 2022-03-12 NOTE — ED Provider Notes (Signed)
Roderic Palau    CSN: [id number] Arrival date & time: 03/12/22  1647      History   Chief Complaint Chief Complaint  Patient presents with   Sore Throat    HPI Heather Cherry is a 36 y.o. female.    Sore Throat    Presents to urgent care with complaint of acutely sore throat starting this morning.  Patient endorses frequent cases of strep throat and is certain the symptoms are the same.  She states she saw white patches on the back of her throat.  Denies fever, chills, myalgias.  Past Medical History:  Diagnosis Date   Abnormal vaginal bleeding    Antiphospholipid antibody syndrome (HCC)    Anxiety    Depression    Diabetes mellitus without complication (HCC)    DVT (deep venous thrombosis) (HCC)    Fibroid    Graves disease    Hypertension    Obesity    Pulmonary emboli (HCC)    Sinus congestion    Sleep apnea     Patient Active Problem List   Diagnosis Date Noted   Intramural leiomyoma of uterus 07/31/2020   Pelvic mass 07/31/2020   Diabetes mellitus without complication ([place]) 29/93/7169   DVT (deep vein thrombosis) in pregnancy 03/26/2016   Hypokalemia 03/26/2016   Anemia 03/26/2016   Morbid obesity ([place]) 03/26/2016   Pulmonary embolism ([place]) 03/24/2016   Pseudotumor cerebri 03/19/2016   Pleuritic chest pain 03/18/2016   Chest pain 03/18/2016   Right pulmonary embolus ([place]) 03/17/2016   PE (pulmonary thromboembolism) ([place]) 03/12/2016   DVT (deep venous thrombosis) ([place]) 03/12/2016   HTN (hypertension) 03/12/2016   Depression 03/12/2016   Anxiety 03/12/2016   GERD (gastroesophageal reflux disease) 03/12/2016   Hyperglycemia 03/12/2016    Past Surgical History:  Procedure Laterality Date   CHOLECYSTECTOMY     COLONOSCOPY     IVC FILTER REMOVAL N/A 04/29/2017   Procedure: IVC FILTER REMOVAL;  Surgeon: Algernon Huxley, MD;  Location: [facility];  Service: Cardiovascular;  Laterality: N/A;   PERIPHERAL VASCULAR CATHETERIZATION N/A  03/25/2016   Procedure: IVC Filter Insertion;  Surgeon: Katha Cabal, MD;  Location: [facility];  Service: Cardiovascular;  Laterality: N/A;   WISDOM TOOTH EXTRACTION      OB History     Gravida  0   Para      Term      Preterm      AB      Living         SAB      IAB      Ectopic      Multiple      Live Births               Home Medications    Prior to Admission medications   Medication Sig Start Date End Date Taking? Authorizing Provider  amoxicillin (AMOXIL) 500 MG capsule Take 1 capsule (500 mg total) by mouth 2 (two) times daily for 10 days. 03/12/22 03/22/22 Yes Aysel Gilchrest, Annie Main, FNP  acetaminophen (TYLENOL) 500 MG tablet Take 1,000 mg by mouth every 6 (six) hours as needed for moderate pain or headache.     [provider]  acetaZOLAMIDE (DIAMOX) 250 MG tablet Take 250 mg by mouth daily.    [provider]  amLODipine (NORVASC) 5 MG tablet Take 5 mg by mouth daily.    [provider]  DULoxetine (CYMBALTA) 30 MG capsule Take 30 mg by  mouth daily.    [provider]  enoxaparin (LOVENOX) 80 MG/0.8ML injection Inject 160 mg into the skin every 12 (twelve) hours.    [provider]  ferrous sulfate 325 (65 FE) MG tablet Take 325 mg by mouth 3 (three) times daily with meals.    [provider]  HYDROcodone-acetaminophen (NORCO) 5-325 MG tablet Take 1 tablet by mouth every 4 (four) hours as needed for moderate pain. 07/27/20   Naaman Plummer, MD  metFORMIN (GLUCOPHAGE-XR) 500 MG 24 hr tablet Take 500 mg by mouth daily with breakfast.    [provider]  methimazole (TAPAZOLE) 10 MG tablet Take 10-20 mg by mouth See admin instructions. Take 2 tablets ('20mg'$ ) by mouth every morning and take 1 tablet ('10mg'$ ) by mouth every evening    [provider]  methocarbamol (ROBAXIN) 500 MG tablet Take 1 tablet (500 mg total) by mouth 4 (four) times daily. 05/26/21   Cuthriell, Charline Bills,  PA-C  metoprolol succinate (TOPROL-XL) 100 MG 24 hr tablet Take 100 mg by mouth daily.    [provider]  omeprazole (PRILOSEC) 20 MG capsule Take 20 mg by mouth daily.    [provider]  predniSONE (DELTASONE) 50 MG tablet Take 1 tablet (50 mg total) by mouth daily with breakfast. 05/26/21   Cuthriell, Charline Bills, PA-C  prochlorperazine (COMPAZINE) 10 MG tablet Take 1 tablet (10 mg total) by mouth every 8 (eight) hours as needed for nausea or vomiting (or headache). 08/08/20   Blake Divine, MD  rizatriptan (MAXALT) 10 MG tablet Take 10 mg by mouth as needed for migraine. May repeat in 2 hours if needed    [provider]    Family History Family History  Problem Relation Age of Onset   Cancer Paternal Grandfather    Breast cancer Paternal Grandmother    Rheumatologic disease Neg Hx    Colon cancer Neg Hx    Endometrial cancer Neg Hx    Prostate cancer Neg Hx    Pancreatic cancer Neg Hx    Ovarian cancer Neg Hx     Social History Social History   Tobacco Use   Smoking status: Never   Smokeless tobacco: Never  Vaping Use   Vaping Use: Never used  Substance Use Topics   Alcohol use: Yes    Comment: occas. social   Drug use: No     Allergies   Lisinopril   Review of Systems Review of Systems   Physical Exam Triage Vital Signs ED Triage Vitals  Enc Vitals Group     BP 03/12/22 1702 (!) 140/79     Pulse Rate 03/12/22 1702 82     Resp 03/12/22 1702 18     Temp 03/12/22 1702 99 F (37.2 C)     Temp src --      SpO2 03/12/22 1702 98 %     Weight --      Height --      Head Circumference --      Peak Flow --      Pain Score 03/12/22 1701 0     Pain Loc --      Pain Edu? --      Excl. in [place]? --    No data found.  Updated Vital Signs BP (!) 140/79   Pulse 82   Temp 99 F (37.2 C)   Resp 18   LMP  (LMP Unknown)   SpO2 98%   Visual Acuity Right Eye Distance:  Left Eye Distance:   Bilateral Distance:    Right Eye Near:    Left Eye Near:    Bilateral Near:     Physical Exam Vitals reviewed.  Constitutional:      Appearance: She is well-developed.  HENT:     Mouth/Throat:     Pharynx: Posterior oropharyngeal erythema present. No oropharyngeal exudate.     Tonsils: No tonsillar exudate.  Skin:    General: Skin is warm and dry.  Neurological:     General: No focal deficit present.     Mental Status: She is alert and oriented to person, place, and time.  Psychiatric:        Mood and Affect: Mood normal.        Behavior: Behavior normal.      UC Treatments / Results  Labs (all labs ordered are listed, but only abnormal results are displayed) Labs Reviewed  CULTURE, GROUP A STREP [facility])  POCT RAPID STREP A (OFFICE)    EKG   Radiology No results found.  Procedures Procedures (including critical care time)  Medications Ordered in UC Medications - No data to display  Initial Impression / Assessment and Plan / UC Course  I have reviewed the triage vital signs and the nursing notes.  Pertinent labs & imaging results that were available during my care of the patient were reviewed by me and considered in my medical decision making (see chart for details).   Rapid strep is negative.  Pharynx is very erythematous without presence of peritonsillar exudates.  Will treat empirically for bacterial pharyngitis given patient's certainty and description of white patches while sending swab to the lab for verification.   Final Clinical Impressions(s) / UC Diagnoses   Final diagnoses:  Acute pharyngitis, unspecified etiology     Discharge Instructions      We are treating you today presumptively for bacterial pharyngitis though our in clinic test was negative.  Setting the sample to lab for culture to confirm bacteria present.  You will be contacted when the culture is mature.      ED Prescriptions     Medication Sig Dispense Auth. Provider   amoxicillin (AMOXIL) 500 MG capsule Take 1  capsule (500 mg total) by mouth 2 (two) times daily for 10 days. 20 capsule Shacoria Latif, Annie Main, FNP      PDMP not reviewed this encounter.   Rose Phi, [place] 03/12/22 1723

## 2022-03-14 ENCOUNTER — Other Ambulatory Visit: Payer: Self-pay

## 2022-03-14 ENCOUNTER — Emergency Department (HOSPITAL_COMMUNITY)
Admission: EM | Admit: 2022-03-14 | Discharge: 2022-03-14 | Disposition: A | Discharge status: Home / Self Care | Payer: BC Managed Care – PPO | Attending: Emergency Medicine | Admitting: Emergency Medicine

## 2022-03-14 ENCOUNTER — Encounter (HOSPITAL_COMMUNITY): Payer: Self-pay

## 2022-03-14 DIAGNOSIS — Z7984 Long term (current) use of oral hypoglycemic drugs: Secondary | ICD-10-CM | POA: Diagnosis not present

## 2022-03-14 DIAGNOSIS — B954 Other streptococcus as the cause of diseases classified elsewhere: Secondary | ICD-10-CM

## 2022-03-14 DIAGNOSIS — J039 Acute tonsillitis, unspecified: Secondary | ICD-10-CM

## 2022-03-14 DIAGNOSIS — B950 Streptococcus, group A, as the cause of diseases classified elsewhere: Secondary | ICD-10-CM | POA: Diagnosis not present

## 2022-03-14 DIAGNOSIS — Z1152 Encounter for screening for COVID-19: Secondary | ICD-10-CM | POA: Insufficient documentation

## 2022-03-14 DIAGNOSIS — R509 Fever, unspecified: Secondary | ICD-10-CM | POA: Diagnosis present

## 2022-03-14 LAB — GROUP A STREP BY PCR: Group A Strep by PCR: NOT DETECTED

## 2022-03-14 LAB — CULTURE, GROUP A STREP (THRC)

## 2022-03-14 LAB — RESP PANEL BY RT-PCR (FLU A&B, COVID) ARPGX2
Influenza A by PCR: NEGATIVE
Influenza B by PCR: NEGATIVE
SARS Coronavirus 2 by RT PCR: NEGATIVE

## 2022-03-14 LAB — MONONUCLEOSIS SCREEN: Mono Screen: NEGATIVE

## 2022-03-14 MED ORDER — IBUPROFEN 400 MG PO TABS
600.0000 mg | ORAL_TABLET | Freq: Once | ORAL | Status: AC
  Administered 2022-03-14: 600 mg via ORAL
  Filled 2022-03-14: qty 1

## 2022-03-14 MED ORDER — SODIUM CHLORIDE 0.9 % IV BOLUS: 1000.0000 mL | Freq: Once | INTRAVENOUS | Status: DC

## 2022-03-14 MED ORDER — ACETAMINOPHEN 325 MG PO TABS
650.0000 mg | ORAL_TABLET | Freq: Once | ORAL | Status: AC
  Administered 2022-03-14: 650 mg via ORAL
  Filled 2022-03-14: qty 2

## 2022-03-14 MED ORDER — DEXAMETHASONE SODIUM PHOSPHATE 10 MG/ML IJ SOLN
10.0000 mg | Freq: Once | INTRAMUSCULAR | Status: AC
  Administered 2022-03-14: 10 mg via INTRAVENOUS
  Filled 2022-03-14: qty 1

## 2022-03-14 NOTE — ED Provider Notes (Signed)
[facility] EMERGENCY DEPARTMENT Provider Note   CSN: [id number] Arrival date & time: 03/14/22  1141     History  Chief Complaint  Patient presents with   Sore Throat   Fever    Heather Cherry is a 36 y.o. female.  36 year old female presents with concern for sore throat and fever x2 days.  Patient went to urgent care 2 days ago, had a negative strep test, culture is pending.  Patient was started on amoxicillin.  She is taking amoxicillin without any improvement.  Denies cough, congestion, body aches.  Works as a Pharmacist, hospital.       Home Medications Prior to Admission medications   Medication Sig Start Date End Date Taking? Authorizing Provider  acetaminophen (TYLENOL) 500 MG tablet Take 1,000 mg by mouth every 6 (six) hours as needed for moderate pain or headache.     [provider]  acetaZOLAMIDE (DIAMOX) 250 MG tablet Take 250 mg by mouth daily.    [provider]  amLODipine (NORVASC) 5 MG tablet Take 5 mg by mouth daily.    [provider]  amoxicillin (AMOXIL) 500 MG capsule Take 1 capsule (500 mg total) by mouth 2 (two) times daily for 10 days. 03/12/22 03/22/22  Immordino, Annie Main, FNP  DULoxetine (CYMBALTA) 30 MG capsule Take 30 mg by mouth daily.    [provider]  enoxaparin (LOVENOX) 80 MG/0.8ML injection Inject 160 mg into the skin every 12 (twelve) hours.    [provider]  ferrous sulfate 325 (65 FE) MG tablet Take 325 mg by mouth 3 (three) times daily with meals.    [provider]  HYDROcodone-acetaminophen (NORCO) 5-325 MG tablet Take 1 tablet by mouth every 4 (four) hours as needed for moderate pain. 07/27/20   Naaman Plummer, MD  metFORMIN (GLUCOPHAGE-XR) 500 MG 24 hr tablet Take 500 mg by mouth daily with breakfast.    [provider]  methimazole (TAPAZOLE) 10 MG tablet Take 10-20 mg by mouth See admin instructions. Take 2 tablets ('20mg'$ ) by mouth every morning and take 1 tablet  ('10mg'$ ) by mouth every evening    [provider]  methocarbamol (ROBAXIN) 500 MG tablet Take 1 tablet (500 mg total) by mouth 4 (four) times daily. 05/26/21   Cuthriell, Charline Bills, PA-C  metoprolol succinate (TOPROL-XL) 100 MG 24 hr tablet Take 100 mg by mouth daily.    [provider]  omeprazole (PRILOSEC) 20 MG capsule Take 20 mg by mouth daily.    [provider]  predniSONE (DELTASONE) 50 MG tablet Take 1 tablet (50 mg total) by mouth daily with breakfast. 05/26/21   Cuthriell, Charline Bills, PA-C  prochlorperazine (COMPAZINE) 10 MG tablet Take 1 tablet (10 mg total) by mouth every 8 (eight) hours as needed for nausea or vomiting (or headache). 08/08/20   Blake Divine, MD  rizatriptan (MAXALT) 10 MG tablet Take 10 mg by mouth as needed for migraine. May repeat in 2 hours if needed    [provider]      Allergies    Lisinopril    Review of Systems   Review of Systems Negative except as per HPI Physical Exam Updated Vital Signs BP (!) 151/71 (BP Location: Right Arm)   Pulse (!) 119   Temp (!) 100.6 F (38.1 C) (Oral)   Resp (!) 21   LMP  (LMP Unknown) Comment: Lupron injection  SpO2 97%  Physical Exam Vitals and nursing note reviewed.  Constitutional:  General: She is not in acute distress.    Appearance: She is well-developed. She is not diaphoretic.  HENT:     Head: Normocephalic and atraumatic.     Right Ear: Tympanic membrane and ear canal normal.     Left Ear: Tympanic membrane and ear canal normal.     Mouth/Throat:     Mouth: Mucous membranes are moist.     Pharynx: Uvula midline. Posterior oropharyngeal erythema present. No pharyngeal swelling, oropharyngeal exudate or uvula swelling.     Tonsils: Tonsillar exudate present. 1+ on the right. 1+ on the left.  Cardiovascular:     Rate and Rhythm: Normal rate and regular rhythm.     Heart sounds: Normal heart sounds.  Pulmonary:     Effort: Pulmonary effort is normal.   Lymphadenopathy:     Cervical: Cervical adenopathy present.  Skin:    General: Skin is warm and dry.     Findings: No erythema or rash.  Neurological:     Mental Status: She is alert and oriented to person, place, and time.  Psychiatric:        Behavior: Behavior normal.     ED Results / Procedures / Treatments   Labs (all labs ordered are listed, but only abnormal results are displayed) Labs Reviewed  RESP PANEL BY RT-PCR (FLU A&B, COVID) [id number]  GROUP A STREP BY PCR  MONONUCLEOSIS SCREEN    EKG None  Radiology No results found.  Procedures Procedures    Medications Ordered in ED Medications  acetaminophen (TYLENOL) tablet 650 mg (has no administration in time range)  sodium chloride 0.9 % bolus 1,000 mL (has no administration in time range)  dexamethasone (DECADRON) injection 10 mg (has no administration in time range)  ibuprofen (ADVIL) tablet 600 mg (600 mg Oral Given 03/14/22 1258)    ED Course/ Medical Decision Making/ A&P                           Medical Decision Making Amount and/or Complexity of Data Reviewed Labs: ordered.  Risk OTC drugs. Prescription drug management.   36 year old female with concern for sore throat and fever x2 days.  Started amoxicillin 2 days ago, concerned due to ongoing pain in her throat with white patches on her tonsils and ongoing fever. Patient was given Motrin in triage, continues to have a fever at time evaluation and is diaphoretic.  She is mildly tachycardic.  Has mild swelling of the tonsils with white exudate and tender cervical lymphadenopathy.  Strep PCR is negative.  COVID/flu and mono pending.  Plan is to add IV fluids and Decadron for symptom management.  Suspect viral illness.  COVID and flu negative.  Throat culture from urgent care 2 days ago returns with nongroup a strep.  Discussed results with patient.  Offered IV fluids, Decadron, Tylenol prior to discharge.  Recommend recheck with primary care  provider.         Final Clinical Impression(s) / ED Diagnoses Final diagnoses:  Tonsillitis  Disease due to invasive non-group A and non-group B beta-hemolytic Streptococcus    Rx / DC Orders ED Discharge Orders     None         Roque Lias 03/14/22 1539    Davonna Belling, MD 03/15/22 0700

## 2022-03-14 NOTE — ED Triage Notes (Addendum)
"  I have strep throat. I know I have strep throat." Pt c/o sore throat for the past 2 days. Pt was seen and swabbed with a rapid strep that was negative. Pt was started on Amoxicillin. Worsening symptoms. Pt teaches middle school.

## 2022-03-14 NOTE — ED Provider Triage Note (Signed)
Emergency Medicine Provider Triage Evaluation Note  Heather Cherry , a 36 y.o. female  was evaluated in triage.  Pt complains of sore throat x 2 days. Was seen at [facility] on 11/30 and tested negative for strep, but she is sure she has strep. Was given amoxicillin and it has not helped. Pt teaches medical school. Had 102.6 F fever at home, took nyquil at 7am.  Review of Systems  Positive: Sore throat, fever Negative: cough  Physical Exam  BP (!) 151/71 (BP Location: Right Arm)   Pulse (!) 123   Temp (!) 100.6 F (38.1 C) (Oral)   Resp (!) 21   LMP  (LMP Unknown) Comment: Lupron injection  SpO2 98%  Gen:   Awake, no distress   Resp:  Normal effort  MSK:   Moves extremities without difficulty  Other:  Tolerating own secretions  Medical Decision Making  Medically screening exam initiated at 12:43 PM.  Appropriate orders placed.  Heather Cherry was informed that the remainder of the evaluation will be completed by another provider, this initial triage assessment does not replace that evaluation, and the importance of remaining in the ED until their evaluation is complete.  Reviewed results, rapid strep was negative on 11/30, culture pending. Was not tested for viruses. Pt given motrin for fever in triage.    Addaleigh Nicholls T, PA-C 03/14/22 1248

## 2022-03-14 NOTE — Discharge Instructions (Addendum)
Please follow-up in your MyChart account for your [facility] results.  Recheck with your doctor in the coming week.  Return to ER for worsening or concerning symptoms.  Home to rest and hydrate.  Continue with Motrin and Tylenol as needed as directed for fevers.  Gargle warm salt water, drink warm tea with honey.   Throat culture from urgent care complete- strep throat however not group A, should resolve with time.

## 2022-03-16 ENCOUNTER — Encounter (HOSPITAL_COMMUNITY): Payer: Self-pay

## 2022-03-16 ENCOUNTER — Emergency Department (HOSPITAL_COMMUNITY)
Admission: EM | Admit: 2022-03-16 | Discharge: 2022-03-16 | Discharge status: Against Medical Advice | Payer: BC Managed Care – PPO | Attending: Emergency Medicine | Admitting: Emergency Medicine

## 2022-03-16 DIAGNOSIS — Z5321 Procedure and treatment not carried out due to patient leaving prior to being seen by health care provider: Secondary | ICD-10-CM | POA: Insufficient documentation

## 2022-03-16 DIAGNOSIS — R509 Fever, unspecified: Secondary | ICD-10-CM | POA: Insufficient documentation

## 2022-03-16 DIAGNOSIS — J029 Acute pharyngitis, unspecified: Secondary | ICD-10-CM | POA: Diagnosis not present

## 2022-03-16 MED ORDER — IBUPROFEN 800 MG PO TABS
800.0000 mg | ORAL_TABLET | Freq: Once | ORAL | Status: AC
  Administered 2022-03-16: 800 mg via ORAL
  Filled 2022-03-16: qty 1

## 2022-03-16 NOTE — Progress Notes (Signed)
I ignored the negative strep PCR. Already treated with amoxicillin.

## 2022-03-16 NOTE — ED Notes (Signed)
Patient stated she was leaving she didn't want to wait several hours to be seen.

## 2022-03-16 NOTE — ED Triage Notes (Signed)
Pt comes via [facility] for fever, sore throat, white coated, on antibiotics

## 2022-03-16 NOTE — ED Provider Triage Note (Signed)
Emergency Medicine Provider Triage Evaluation Note  Heather Cherry , a 36 y.o. female  was evaluated in triage.  Pt complains of fever, sore throat.  Seen x2 recently for same with negative strep/mono/covid/flu testing, throat culture + for beta hemolytic strep.  She is currently on amoxicillin.  Review of Systems  Positive: Fever, sore throat Negative: vomiting  Physical Exam  BP (!) 151/89 (BP Location: Right Arm)   Pulse (!) 110   Temp (!) 100.6 F (38.1 C)   Resp 20   LMP  (LMP Unknown) Comment: Lupron injection  SpO2 98%   Gen:   Awake, no distress   Resp:  Normal effort  MSK:   Moves extremities without difficulty  Other:  Tonsils 2+ bilaterally with exudates present, uvula midline without deviation, handling secretions well, no stridor.  Medical Decision Making  Medically screening exam initiated at 4:39 AM.  Appropriate orders placed.  Heather Cherry was informed that the remainder of the evaluation will be completed by another provider, this initial triage assessment does not replace that evaluation, and the importance of remaining in the ED until their evaluation is complete.  Fever, sore throat.  Negative recent strep/covid/flu/mono testing but throat culture + for B hemolytic strep, already on amoxicillin.  Tonsils 2+ by symmetric, no airway compromise.  No signs/symptoms concerning for PTA or deep space infection of the neck on triage exam.  Motrin given for fever.   Larene Pickett, PA-C 03/16/22 [phone]

## 2022-12-04 ENCOUNTER — Encounter (HOSPITAL_COMMUNITY): Payer: Self-pay | Admitting: *Deleted

## 2022-12-04 ENCOUNTER — Emergency Department (HOSPITAL_COMMUNITY): Payer: BC Managed Care – PPO

## 2022-12-04 ENCOUNTER — Emergency Department (HOSPITAL_COMMUNITY)
Admission: EM | Admit: 2022-12-04 | Discharge: 2022-12-04 | Disposition: A | Discharge status: Home / Self Care | Payer: BC Managed Care – PPO | Attending: Emergency Medicine | Admitting: Emergency Medicine

## 2022-12-04 ENCOUNTER — Other Ambulatory Visit: Payer: Self-pay

## 2022-12-04 DIAGNOSIS — R079 Chest pain, unspecified: Secondary | ICD-10-CM | POA: Insufficient documentation

## 2022-12-04 LAB — BASIC METABOLIC PANEL
Anion gap: 10 (ref 5–15)
BUN: 6 mg/dL (ref 6–20)
CO2: 19 mmol/L — ABNORMAL LOW (ref 22–32)
Calcium: 8.8 mg/dL — ABNORMAL LOW (ref 8.9–10.3)
Chloride: 109 mmol/L (ref 98–111)
Creatinine, Ser: 0.67 mg/dL (ref 0.44–1.00)
GFR, Estimated: >60 mL/min (ref >60)
Glucose, Bld: 95 mg/dL (ref 70–99)
Potassium: 3.4 mmol/L — ABNORMAL LOW (ref 3.5–5.1)
Sodium: 138 mmol/L (ref 135–145)

## 2022-12-04 LAB — PROTIME-INR
INR: 1 (ref 0.8–1.2)
Prothrombin Time: 13.6 seconds (ref 11.4–15.2)

## 2022-12-04 LAB — TROPONIN I (HIGH SENSITIVITY)
Troponin I (High Sensitivity): 3 ng/L (ref <18)
Troponin I (High Sensitivity): 4 ng/L (ref <18)

## 2022-12-04 LAB — CBC
HCT: 44.2 % (ref 36.0–46.0)
Hemoglobin: 14 g/dL (ref 12.0–15.0)
MCH: 26.5 pg (ref 26.0–34.0)
MCHC: 31.7 g/dL (ref 30.0–36.0)
MCV: 83.6 fL (ref 80.0–100.0)
Platelets: 243 10*3/uL (ref 150–400)
RBC: 5.29 MIL/uL — ABNORMAL HIGH (ref 3.87–5.11)
RDW: 14.5 % (ref 11.5–15.5)
WBC: 7.4 10*3/uL (ref 4.0–10.5)
nRBC: 0 % (ref 0.0–0.2)

## 2022-12-04 LAB — HCG, SERUM, QUALITATIVE: Preg, Serum: NEGATIVE

## 2022-12-04 LAB — D-DIMER, QUANTITATIVE: D-Dimer, Quant: <0.27 ug{FEU}/mL (ref 0.00–0.50)

## 2022-12-04 MED ORDER — ACETAMINOPHEN 500 MG PO TABS
1000.0000 mg | ORAL_TABLET | Freq: Once | ORAL | Status: AC
  Administered 2022-12-04: 1000 mg via ORAL
  Filled 2022-12-04: qty 2

## 2022-12-04 MED ORDER — METOPROLOL TARTRATE 5 MG/5ML IV SOLN
5.0000 mg | Freq: Once | INTRAVENOUS | Status: DC
  Filled 2022-12-04: qty 5

## 2022-12-04 MED ORDER — ENOXAPARIN SODIUM 150 MG/ML IJ SOSY
150.0000 mg | PREFILLED_SYRINGE | Freq: Once | INTRAMUSCULAR | Status: AC
  Administered 2022-12-04: 150 mg via SUBCUTANEOUS
  Filled 2022-12-04: qty 1

## 2022-12-04 MED ORDER — LORAZEPAM 2 MG/ML IJ SOLN
0.5000 mg | Freq: Once | INTRAMUSCULAR | Status: AC
  Administered 2022-12-04: 0.5 mg via INTRAVENOUS
  Filled 2022-12-04: qty 1

## 2022-12-04 MED ORDER — AMLODIPINE BESYLATE 5 MG PO TABS
5.0000 mg | ORAL_TABLET | Freq: Once | ORAL | Status: AC
  Administered 2022-12-04: 5 mg via ORAL
  Filled 2022-12-04: qty 1

## 2022-12-04 MED ORDER — ENOXAPARIN SODIUM 300 MG/3ML IJ SOLN: 160.0000 mg | INTRAMUSCULAR | Status: DC

## 2022-12-04 MED ORDER — IOHEXOL 350 MG/ML SOLN
75.0000 mL | Freq: Once | INTRAVENOUS | Status: AC | PRN
  Administered 2022-12-04: 75 mL via INTRAVENOUS

## 2022-12-04 NOTE — ED Notes (Signed)
Patient transported to X-ray 

## 2022-12-04 NOTE — ED Triage Notes (Signed)
C/o chest pain with sob states she has a history of PE and this feels just like the pain before, states she is on Lovenox however has always been on a thinner with previous PE. States she can't sit still.

## 2022-12-04 NOTE — ED Provider Notes (Signed)
[place] EMERGENCY DEPARTMENT AT [facility] Provider Note   CSN: [id number] Arrival date & time: 12/04/22  1456     History  Chief Complaint  Patient presents with   Chest Pain    Heather Cherry is a 37 y.o. female.  HPI 37 year old female history of PE presents today complaining of sharp left-sided chest pain that began this morning and worsened around noon.  She describes it as similar to prior PE.  It is sharp in the left lower chest area and the left lower R[place] area.  She is on Lovenox.  She states she took her last dose at midnight and then missed her noon dose because she was sleeping.  She reports that she normally takes it yearly.  She has been on Lovenox since 2018.  She had to be changed to Lovenox due to clotting abnormality and had failed p.o.'s.  Reports that since she has been on Lovenox since approximately 2018 and had not had any PEs while on it.    Home Medications Prior to Admission medications   Medication Sig Start Date End Date Taking? Authorizing Provider  acetaminophen (TYLENOL) 500 MG tablet Take 1,000 mg by mouth every 6 (six) hours as needed for moderate pain or headache.     [provider]  acetaZOLAMIDE (DIAMOX) 250 MG tablet Take 250 mg by mouth daily.    [provider]  amLODipine (NORVASC) 5 MG tablet Take 5 mg by mouth daily.    [provider]  DULoxetine (CYMBALTA) 30 MG capsule Take 30 mg by mouth daily.    [provider]  enoxaparin (LOVENOX) 80 MG/0.8ML injection Inject 160 mg into the skin every 12 (twelve) hours.    [provider]  ferrous sulfate 325 (65 FE) MG tablet Take 325 mg by mouth 3 (three) times daily with meals.    [provider]  HYDROcodone-acetaminophen (NORCO) 5-325 MG tablet Take 1 tablet by mouth every 4 (four) hours as needed for moderate pain. 07/27/20   Merwyn Katos, MD  metFORMIN (GLUCOPHAGE-XR) 500 MG 24 hr tablet Take 500 mg by mouth daily with  breakfast.    [provider]  methimazole (TAPAZOLE) 10 MG tablet Take 10-20 mg by mouth See admin instructions. Take 2 tablets (20mg ) by mouth every morning and take 1 tablet (10mg ) by mouth every evening    [provider]  methocarbamol (ROBAXIN) 500 MG tablet Take 1 tablet (500 mg total) by mouth 4 (four) times daily. 05/26/21   Cuthriell, Delorise Royals, PA-C  metoprolol succinate (TOPROL-XL) 100 MG 24 hr tablet Take 100 mg by mouth daily.    [provider]  omeprazole (PRILOSEC) 20 MG capsule Take 20 mg by mouth daily.    [provider]  predniSONE (DELTASONE) 50 MG tablet Take 1 tablet (50 mg total) by mouth daily with breakfast. 05/26/21   Cuthriell, Delorise Royals, PA-C  prochlorperazine (COMPAZINE) 10 MG tablet Take 1 tablet (10 mg total) by mouth every 8 (eight) hours as needed for nausea or vomiting (or headache). 08/08/20   Chesley Noon, MD  rizatriptan (MAXALT) 10 MG tablet Take 10 mg by mouth as needed for migraine. May repeat in 2 hours if needed    [provider]      Allergies    Lisinopril    Review of Systems   Review of Systems  Physical Exam Updated Vital Signs BP (!) 142/62   Pulse 81   Temp 98.4 F (36.9 C) (  Oral)   Resp (!) 21   Ht 1.753 m (5\' 9" )   Wt (!) 155.6 kg   SpO2 100%   BMI 50.65 kg/m  Physical Exam Vitals reviewed.  Constitutional:      General: She is not in acute distress.    Appearance: She is well-developed.  HENT:     Head: Normocephalic.  Eyes:     Pupils: Pupils are equal, round, and reactive to light.  Cardiovascular:     Rate and Rhythm: Normal rate and regular rhythm.     Heart sounds: Normal heart sounds.  Pulmonary:     Breath sounds: Normal breath sounds.  Abdominal:     Palpations: Abdomen is soft.  Musculoskeletal:        General: Normal range of motion.     Cervical back: Normal range of motion.  Skin:    General: Skin is warm.     Capillary Refill: Capillary refill takes less  than 2 seconds.  Neurological:     General: No focal deficit present.     Mental Status: She is alert.     ED Results / Procedures / Treatments   Labs (all labs ordered are listed, but only abnormal results are displayed) Labs Reviewed  BASIC METABOLIC PANEL - Abnormal; Notable for the following components:      Result Value   Potassium 3.4 (*)    CO2 19 (*)    Calcium 8.8 (*)    All other components within normal limits  CBC - Abnormal; Notable for the following components:   RBC 5.29 (*)    All other components within normal limits  HCG, SERUM, QUALITATIVE  PROTIME-INR  D-DIMER, QUANTITATIVE  TROPONIN I (HIGH SENSITIVITY)  TROPONIN I (HIGH SENSITIVITY)    EKG EKG Interpretation Date/Time:  Friday December 04 2022 15:07:27 EDT Ventricular Rate:  117 PR Interval:  136 QRS Duration:  78 QT Interval:  306 QTC Calculation: 426 R Axis:   37  Text Interpretation: Sinus tachycardia Nonspecific T wave abnormality Abnormal ECG When compared with ECG of 26-May-2021 18:22, PREVIOUS ECG IS PRESENT Confirmed by Margarita Grizzle [phone]) on 12/04/2022 3:55:26 PM  Radiology No results found.  Procedures Procedures    Medications Ordered in ED Medications  enoxaparin (LOVENOX) injection 150 mg (has no administration in time range)  LORazepam (ATIVAN) injection 0.5 mg (has no administration in time range)  metoprolol tartrate (LOPRESSOR) injection 5 mg (has no administration in time range)  amLODipine (NORVASC) tablet 5 mg (5 mg Oral Given 12/04/22 1643)    ED Course/ Medical Decision Making/ A&P Clinical Course as of 12/04/22 1644  Fri Dec 04, 2022  1635 Hx HTN, PE.  On Lovenox.  L sided chest pain like prior PE.  Follow up CTA likely DC home. [Heather Cherry]    Clinical Course User Index [Heather Cherry] Heather Spates, MD                                 Medical Decision Making Amount and/or Complexity of Data Reviewed Labs: ordered. Radiology: ordered.  Risk Prescription drug  management.  37 year old female known history of PE and antiphospholipid syndrome, hypertension, presents today complaining of left sharp chest pain with some dyspnea.  Here she is tachycardic.  She is also hypertensive. EKG without acutely ischemic changes Differential diagnosis includes but is not limited to pulmonary embolism, pneumonia, other primary lung etiologies, primary cardiology etiologies, Patient evaluated with EKG  which shows sinus tachycardia EKG and labs pending.  Based on prior history and symptoms today patient has CTA ordered. Patient is hemodynamically stable here.  She was initially hypertensive her blood pressures come down to 140/60 without intervention.  She has been given a dose of her home medication Second troponin is pending CTA is pending Care discussed with Dr. Earlene Plater who has assumed care and will disposition as appropriate       Final Clinical Impression(s) / ED Diagnoses Final diagnoses:  Chest pain, unspecified type    Rx / DC Orders ED Discharge Orders     None         Margarita Grizzle, MD 12/04/22 1644

## 2022-12-04 NOTE — ED Provider Notes (Signed)
  Physical Exam  BP (!) 142/62   Pulse 81   Temp 98.4 F (36.9 C) (Oral)   Resp (!) 21   Ht 5\' 9"  (1.753 m)   Wt (!) 155.6 kg   SpO2 100%   BMI 50.65 kg/m   Physical Exam Patient resting comfortably.  Normal work of breathing with clear lung sounds.  Normal cardiac auscultation.  Procedures  Procedures  ED Course / MDM   Clinical Course as of 12/04/22 2028  Fri Dec 04, 2022  1635 Hx HTN, PE.  On Lovenox.  L sided chest pain like prior PE.  Follow up CTA likely DC home. [JD]    Clinical Course User Index [JD] Laurence Spates, MD   Medical Decision Making Amount and/or Complexity of Data Reviewed Labs: ordered. Radiology: ordered.  Risk OTC drugs. Prescription drug management.   Patient with history of hypertension and prior PE presenting for pleuritic chest pain similar to prior PE.  Handout from walking provide will plan to follow-up CTA and troponin likely discharge home if normal.  Repeat troponin is normal, reassuring as ACS.  CTA chest without signs of PE or other acute abnormality.  On reassessment she still has some mild pain, worse with movement potentially musculoskeletal.  Low concern for dissection.  Recommend PCP follow-up, discharged with return precautions.       Laurence Spates, MD 12/04/22 [phone]

## 2022-12-04 NOTE — Discharge Instructions (Addendum)
Your CT scan did not show any signs of blood clots.  Your lab work is reassuring.  I recommend you call your doctor Monday to schedule close follow-up.  If you develop worsening pain, difficulty breathing or any other new concerning symptoms you should return to the ED.

## 2023-08-25 HISTORY — PX: LAPAROSCOPIC GASTRIC SLEEVE RESECTION: SHX5895

## 2023-08-30 ENCOUNTER — Emergency Department (HOSPITAL_COMMUNITY)

## 2023-08-30 ENCOUNTER — Inpatient Hospital Stay (HOSPITAL_COMMUNITY): Admitting: Anesthesiology

## 2023-08-30 ENCOUNTER — Other Ambulatory Visit: Payer: Self-pay

## 2023-08-30 ENCOUNTER — Encounter (HOSPITAL_COMMUNITY): Payer: Self-pay

## 2023-08-30 ENCOUNTER — Encounter (HOSPITAL_COMMUNITY): Admission: EM | Disposition: A | Discharge status: Home / Self Care | Payer: Self-pay | Source: Home / Self Care

## 2023-08-30 ENCOUNTER — Inpatient Hospital Stay (HOSPITAL_COMMUNITY)
Admission: EM | Admit: 2023-08-30 | Discharge: 2023-09-10 | DRG: 475 | Disposition: A | Discharge status: Home / Self Care | Attending: Orthopedic Surgery | Admitting: Orthopedic Surgery

## 2023-08-30 DIAGNOSIS — Z86718 Personal history of other venous thrombosis and embolism: Secondary | ICD-10-CM

## 2023-08-30 DIAGNOSIS — R000 Tachycardia, unspecified: Secondary | ICD-10-CM | POA: Diagnosis present

## 2023-08-30 DIAGNOSIS — G4700 Insomnia, unspecified: Secondary | ICD-10-CM | POA: Diagnosis present

## 2023-08-30 DIAGNOSIS — Z9884 Bariatric surgery status: Secondary | ICD-10-CM | POA: Diagnosis not present

## 2023-08-30 DIAGNOSIS — Z89022 Acquired absence of left finger(s): Secondary | ICD-10-CM

## 2023-08-30 DIAGNOSIS — S060XAA Concussion with loss of consciousness status unknown, initial encounter: Secondary | ICD-10-CM | POA: Diagnosis present

## 2023-08-30 DIAGNOSIS — S63263A Dislocation of metacarpophalangeal joint of left middle finger, initial encounter: Secondary | ICD-10-CM | POA: Diagnosis present

## 2023-08-30 DIAGNOSIS — S6992XA Unspecified injury of left wrist, hand and finger(s), initial encounter: Secondary | ICD-10-CM | POA: Diagnosis not present

## 2023-08-30 DIAGNOSIS — F3181 Bipolar II disorder: Secondary | ICD-10-CM | POA: Diagnosis present

## 2023-08-30 DIAGNOSIS — R071 Chest pain on breathing: Secondary | ICD-10-CM | POA: Diagnosis not present

## 2023-08-30 DIAGNOSIS — S63265A Dislocation of metacarpophalangeal joint of left ring finger, initial encounter: Secondary | ICD-10-CM | POA: Diagnosis present

## 2023-08-30 DIAGNOSIS — E876 Hypokalemia: Secondary | ICD-10-CM | POA: Diagnosis present

## 2023-08-30 DIAGNOSIS — S62132B Displaced fracture of capitate [os magnum] bone, left wrist, initial encounter for open fracture: Secondary | ICD-10-CM | POA: Diagnosis present

## 2023-08-30 DIAGNOSIS — I10 Essential (primary) hypertension: Secondary | ICD-10-CM | POA: Diagnosis present

## 2023-08-30 DIAGNOSIS — I82409 Acute embolism and thrombosis of unspecified deep veins of unspecified lower extremity: Secondary | ICD-10-CM

## 2023-08-30 DIAGNOSIS — Z6841 Body Mass Index (BMI) 40.0 and over, adult: Secondary | ICD-10-CM | POA: Diagnosis not present

## 2023-08-30 DIAGNOSIS — S63267A Dislocation of metacarpophalangeal joint of left little finger, initial encounter: Secondary | ICD-10-CM | POA: Diagnosis present

## 2023-08-30 DIAGNOSIS — I959 Hypotension, unspecified: Secondary | ICD-10-CM | POA: Diagnosis present

## 2023-08-30 DIAGNOSIS — K219 Gastro-esophageal reflux disease without esophagitis: Secondary | ICD-10-CM | POA: Diagnosis present

## 2023-08-30 DIAGNOSIS — S61409A Unspecified open wound of unspecified hand, initial encounter: Secondary | ICD-10-CM | POA: Diagnosis present

## 2023-08-30 DIAGNOSIS — S62131B Displaced fracture of capitate [os magnum] bone, right wrist, initial encounter for open fracture: Secondary | ICD-10-CM | POA: Diagnosis not present

## 2023-08-30 DIAGNOSIS — S63261A Dislocation of metacarpophalangeal joint of left index finger, initial encounter: Secondary | ICD-10-CM | POA: Diagnosis present

## 2023-08-30 DIAGNOSIS — S01512A Laceration without foreign body of oral cavity, initial encounter: Secondary | ICD-10-CM | POA: Diagnosis present

## 2023-08-30 DIAGNOSIS — S41102A Unspecified open wound of left upper arm, initial encounter: Principal | ICD-10-CM

## 2023-08-30 DIAGNOSIS — F419 Anxiety disorder, unspecified: Secondary | ICD-10-CM | POA: Diagnosis present

## 2023-08-30 DIAGNOSIS — S0990XA Unspecified injury of head, initial encounter: Secondary | ICD-10-CM | POA: Diagnosis present

## 2023-08-30 DIAGNOSIS — Z23 Encounter for immunization: Secondary | ICD-10-CM

## 2023-08-30 DIAGNOSIS — D6861 Antiphospholipid syndrome: Secondary | ICD-10-CM | POA: Diagnosis present

## 2023-08-30 DIAGNOSIS — G4733 Obstructive sleep apnea (adult) (pediatric): Secondary | ICD-10-CM | POA: Diagnosis not present

## 2023-08-30 DIAGNOSIS — D62 Acute posthemorrhagic anemia: Secondary | ICD-10-CM | POA: Diagnosis not present

## 2023-08-30 DIAGNOSIS — F4310 Post-traumatic stress disorder, unspecified: Secondary | ICD-10-CM | POA: Diagnosis present

## 2023-08-30 DIAGNOSIS — I272 Pulmonary hypertension, unspecified: Secondary | ICD-10-CM | POA: Diagnosis present

## 2023-08-30 DIAGNOSIS — I2699 Other pulmonary embolism without acute cor pulmonale: Secondary | ICD-10-CM

## 2023-08-30 DIAGNOSIS — S61401A Unspecified open wound of right hand, initial encounter: Secondary | ICD-10-CM | POA: Diagnosis not present

## 2023-08-30 DIAGNOSIS — Z86711 Personal history of pulmonary embolism: Secondary | ICD-10-CM

## 2023-08-30 DIAGNOSIS — F430 Acute stress reaction: Secondary | ICD-10-CM | POA: Diagnosis present

## 2023-08-30 DIAGNOSIS — Z90710 Acquired absence of both cervix and uterus: Secondary | ICD-10-CM

## 2023-08-30 HISTORY — DX: Acute embolism and thrombosis of unspecified deep veins of unspecified lower extremity: I82.409

## 2023-08-30 HISTORY — DX: Antiphospholipid syndrome: D68.61

## 2023-08-30 HISTORY — DX: Gastro-esophageal reflux disease without esophagitis: K21.9

## 2023-08-30 HISTORY — DX: Other pulmonary embolism without acute cor pulmonale: I26.99

## 2023-08-30 HISTORY — DX: Pulmonary hypertension, unspecified: I27.20

## 2023-08-30 HISTORY — PX: OPEN REDUCTION INTERNAL FIXATION (ORIF) DISTAL PHALANX: SHX6236

## 2023-08-30 LAB — COMPREHENSIVE METABOLIC PANEL WITH GFR
ALT: 10 U/L (ref 0–44)
AST: 25 U/L (ref 15–41)
Albumin: 2.9 g/dL — ABNORMAL LOW (ref 3.5–5.0)
Alkaline Phosphatase: 52 U/L (ref 38–126)
Anion gap: 19 — ABNORMAL HIGH (ref 5–15)
BUN: 9 mg/dL (ref 6–20)
CO2: 15 mmol/L — ABNORMAL LOW (ref 22–32)
Calcium: 8.6 mg/dL — ABNORMAL LOW (ref 8.9–10.3)
Chloride: 104 mmol/L (ref 98–111)
Creatinine, Ser: 0.96 mg/dL (ref 0.44–1.00)
GFR, Estimated: >60 mL/min (ref >60)
Glucose, Bld: 209 mg/dL — ABNORMAL HIGH (ref 70–99)
Potassium: 3.1 mmol/L — ABNORMAL LOW (ref 3.5–5.1)
Sodium: 138 mmol/L (ref 135–145)
Total Bilirubin: 0.9 mg/dL (ref 0.0–1.2)
Total Protein: 5.9 g/dL — ABNORMAL LOW (ref 6.5–8.1)

## 2023-08-30 LAB — CBC
HCT: 38.3 % (ref 36.0–46.0)
Hemoglobin: 12.5 g/dL (ref 12.0–15.0)
MCH: 28.1 pg (ref 26.0–34.0)
MCHC: 32.6 g/dL (ref 30.0–36.0)
MCV: 86.1 fL (ref 80.0–100.0)
Platelets: 261 10*3/uL (ref 150–400)
RBC: 4.45 MIL/uL (ref 3.87–5.11)
RDW: 14.3 % (ref 11.5–15.5)
WBC: 11.8 10*3/uL — ABNORMAL HIGH (ref 4.0–10.5)
nRBC: 0 % (ref 0.0–0.2)

## 2023-08-30 LAB — PROTIME-INR
INR: 1.2 (ref 0.8–1.2)
Prothrombin Time: 15.3 s — ABNORMAL HIGH (ref 11.4–15.2)

## 2023-08-30 LAB — I-STAT CHEM 8, ED
BUN: 8 mg/dL (ref 6–20)
Calcium, Ion: 1.12 mmol/L — ABNORMAL LOW (ref 1.15–1.40)
Chloride: 103 mmol/L (ref 98–111)
Creatinine, Ser: 0.9 mg/dL (ref 0.44–1.00)
Glucose, Bld: 206 mg/dL — ABNORMAL HIGH (ref 70–99)
HCT: 40 % (ref 36.0–46.0)
Hemoglobin: 13.6 g/dL (ref 12.0–15.0)
Potassium: 2.9 mmol/L — ABNORMAL LOW (ref 3.5–5.1)
Sodium: 139 mmol/L (ref 135–145)
TCO2: 20 mmol/L — ABNORMAL LOW (ref 22–32)

## 2023-08-30 LAB — I-STAT CG4 LACTIC ACID, ED: Lactic Acid, Venous: 7.1 mmol/L (ref 0.5–1.9)

## 2023-08-30 LAB — HCG, SERUM, QUALITATIVE: Preg, Serum: NEGATIVE

## 2023-08-30 LAB — SAMPLE TO BLOOD BANK

## 2023-08-30 LAB — ETHANOL: Alcohol, Ethyl (B): <15 mg/dL (ref <15)

## 2023-08-30 SURGERY — OPEN REDUCTION INTERNAL FIXATION (ORIF) DISTAL PHALANX
Anesthesia: General | Site: Hand | Laterality: Left

## 2023-08-30 MED ORDER — OXYCODONE HCL 5 MG/5ML PO SOLN
5.0000 mg | Freq: Once | ORAL | Status: DC | PRN
Start: 2023-08-30 — End: 2023-08-31

## 2023-08-30 MED ORDER — ONDANSETRON 4 MG PO TBDP
4.0000 mg | ORAL_TABLET | Freq: Four times a day (QID) | ORAL | Status: DC | PRN
Start: 2023-08-30 — End: 2023-09-10

## 2023-08-30 MED ORDER — CEFAZOLIN SODIUM-DEXTROSE 2-4 GM/100ML-% IV SOLN
2.0000 g | Freq: Three times a day (TID) | INTRAVENOUS | Status: DC
  Administered 2023-08-31 – 2023-09-10 (×30): 2 g via INTRAVENOUS
  Filled 2023-08-30 (×32): qty 100

## 2023-08-30 MED ORDER — ACETAMINOPHEN 10 MG/ML IV SOLN
INTRAVENOUS | Status: DC | PRN
  Administered 2023-08-30: 1000 mg via INTRAVENOUS

## 2023-08-30 MED ORDER — HYDRALAZINE HCL 20 MG/ML IJ SOLN: 10.0000 mg | INTRAMUSCULAR | Status: DC | PRN

## 2023-08-30 MED ORDER — CEFAZOLIN SODIUM-DEXTROSE 3-4 GM/150ML-% IV SOLN: 3.0000 g | Freq: Once | INTRAVENOUS | Status: DC

## 2023-08-30 MED ORDER — PROPOFOL 10 MG/ML IV BOLUS
INTRAVENOUS | Status: AC
  Filled 2023-08-30: qty 20

## 2023-08-30 MED ORDER — FENTANYL CITRATE (PF) 100 MCG/2ML IJ SOLN
INTRAMUSCULAR | Status: AC
  Filled 2023-08-30: qty 2

## 2023-08-30 MED ORDER — SODIUM CHLORIDE 0.9 % IV SOLN: 12.5000 mg | INTRAVENOUS | Status: DC | PRN

## 2023-08-30 MED ORDER — LIDOCAINE 2% (20 MG/ML) 5 ML SYRINGE
INTRAMUSCULAR | Status: DC | PRN
  Administered 2023-08-30: 60 mg via INTRAVENOUS

## 2023-08-30 MED ORDER — LIDOCAINE 2% (20 MG/ML) 5 ML SYRINGE
INTRAMUSCULAR | Status: AC
  Filled 2023-08-30: qty 5

## 2023-08-30 MED ORDER — IOHEXOL 350 MG/ML SOLN
75.0000 mL | Freq: Once | INTRAVENOUS | Status: AC | PRN
  Administered 2023-08-30: 75 mL via INTRAVENOUS

## 2023-08-30 MED ORDER — PHENYLEPHRINE 80 MCG/ML (10ML) SYRINGE FOR IV PUSH (FOR BLOOD PRESSURE SUPPORT)
PREFILLED_SYRINGE | INTRAVENOUS | Status: DC | PRN
  Administered 2023-08-30 (×4): 80 ug via INTRAVENOUS
  Administered 2023-08-30: 160 ug via INTRAVENOUS

## 2023-08-30 MED ORDER — AMISULPRIDE (ANTIEMETIC) 5 MG/2ML IV SOLN: 10.0000 mg | Freq: Once | INTRAVENOUS | Status: DC | PRN

## 2023-08-30 MED ORDER — FENTANYL CITRATE (PF) 250 MCG/5ML IJ SOLN
INTRAMUSCULAR | Status: AC
  Filled 2023-08-30: qty 5

## 2023-08-30 MED ORDER — FENTANYL CITRATE PF 50 MCG/ML IJ SOSY
100.0000 ug | PREFILLED_SYRINGE | Freq: Once | INTRAMUSCULAR | Status: AC
  Administered 2023-08-30: 100 ug via INTRAVENOUS

## 2023-08-30 MED ORDER — OXYCODONE HCL 5 MG PO TABS: 5.0000 mg | ORAL_TABLET | Freq: Once | ORAL | Status: DC | PRN

## 2023-08-30 MED ORDER — MIDAZOLAM HCL 2 MG/2ML IJ SOLN
INTRAMUSCULAR | Status: AC
  Filled 2023-08-30: qty 2

## 2023-08-30 MED ORDER — ROCURONIUM BROMIDE 10 MG/ML (PF) SYRINGE
PREFILLED_SYRINGE | INTRAVENOUS | Status: AC
  Filled 2023-08-30: qty 10

## 2023-08-30 MED ORDER — METHOCARBAMOL 500 MG PO TABS
500.0000 mg | ORAL_TABLET | Freq: Three times a day (TID) | ORAL | Status: AC
  Administered 2023-08-31 – 2023-09-02 (×8): 500 mg via ORAL
  Filled 2023-08-30 (×8): qty 1

## 2023-08-30 MED ORDER — PROPOFOL 10 MG/ML IV BOLUS
INTRAVENOUS | Status: DC | PRN
  Administered 2023-08-30: 200 mg via INTRAVENOUS

## 2023-08-30 MED ORDER — HYDROMORPHONE HCL 1 MG/ML IJ SOLN
1.0000 mg | INTRAMUSCULAR | Status: DC | PRN
  Administered 2023-08-31 – 2023-09-03 (×13): 1 mg via INTRAVENOUS
  Filled 2023-08-30 (×12): qty 1

## 2023-08-30 MED ORDER — LACTATED RINGERS IV SOLN: INTRAVENOUS | Status: DC | PRN

## 2023-08-30 MED ORDER — DEXMEDETOMIDINE HCL IN NACL 80 MCG/20ML IV SOLN
INTRAVENOUS | Status: AC
  Filled 2023-08-30: qty 40

## 2023-08-30 MED ORDER — CEFAZOLIN SODIUM-DEXTROSE 1-4 GM/50ML-% IV SOLN
1.0000 g | Freq: Once | INTRAVENOUS | Status: AC
  Administered 2023-08-30: 1 g via INTRAVENOUS

## 2023-08-30 MED ORDER — ACETAMINOPHEN 10 MG/ML IV SOLN
INTRAVENOUS | Status: AC
  Filled 2023-08-30: qty 100

## 2023-08-30 MED ORDER — DOCUSATE SODIUM 100 MG PO CAPS
100.0000 mg | ORAL_CAPSULE | Freq: Two times a day (BID) | ORAL | Status: DC
  Administered 2023-08-31 – 2023-09-10 (×21): 100 mg via ORAL
  Filled 2023-08-30 (×22): qty 1

## 2023-08-30 MED ORDER — POLYETHYLENE GLYCOL 3350 17 G PO PACK
17.0000 g | PACK | Freq: Every day | ORAL | Status: DC | PRN
  Administered 2023-09-01: 17 g via ORAL
  Filled 2023-08-30: qty 1

## 2023-08-30 MED ORDER — MIDAZOLAM HCL 2 MG/2ML IJ SOLN
INTRAMUSCULAR | Status: DC | PRN
  Administered 2023-08-30: 2 mg via INTRAVENOUS

## 2023-08-30 MED ORDER — ENOXAPARIN SODIUM 30 MG/0.3ML IJ SOSY
30.0000 mg | PREFILLED_SYRINGE | Freq: Two times a day (BID) | INTRAMUSCULAR | Status: DC
  Administered 2023-08-31 – 2023-09-06 (×12): 30 mg via SUBCUTANEOUS
  Filled 2023-08-30 (×12): qty 0.3

## 2023-08-30 MED ORDER — ONDANSETRON HCL 4 MG/2ML IJ SOLN
INTRAMUSCULAR | Status: DC | PRN
  Administered 2023-08-30: 4 mg via INTRAVENOUS

## 2023-08-30 MED ORDER — ACETAMINOPHEN 500 MG PO TABS: 1000.0000 mg | ORAL_TABLET | Freq: Four times a day (QID) | ORAL | Status: DC

## 2023-08-30 MED ORDER — BACITRACIN ZINC 500 UNIT/GM EX OINT
TOPICAL_OINTMENT | CUTANEOUS | Status: AC
  Filled 2023-08-30: qty 28.35

## 2023-08-30 MED ORDER — SUCCINYLCHOLINE CHLORIDE 200 MG/10ML IV SOSY
PREFILLED_SYRINGE | INTRAVENOUS | Status: DC | PRN
  Administered 2023-08-30: 140 mg via INTRAVENOUS

## 2023-08-30 MED ORDER — OXYCODONE HCL 5 MG PO TABS
5.0000 mg | ORAL_TABLET | ORAL | Status: DC | PRN
  Administered 2023-08-31 – 2023-09-02 (×10): 5 mg via ORAL
  Filled 2023-08-30 (×10): qty 1

## 2023-08-30 MED ORDER — GABAPENTIN 300 MG PO CAPS
300.0000 mg | ORAL_CAPSULE | Freq: Three times a day (TID) | ORAL | Status: DC
  Administered 2023-08-31 – 2023-09-04 (×15): 300 mg via ORAL
  Filled 2023-08-30 (×15): qty 1

## 2023-08-30 MED ORDER — SODIUM CHLORIDE 0.9 % IR SOLN
Status: DC | PRN
  Administered 2023-08-30 (×3): 3000 mL

## 2023-08-30 MED ORDER — CEFAZOLIN SODIUM-DEXTROSE 2-4 GM/100ML-% IV SOLN
2.0000 g | Freq: Once | INTRAVENOUS | Status: AC
  Administered 2023-08-30: 2 g via INTRAVENOUS

## 2023-08-30 MED ORDER — ONDANSETRON HCL 4 MG/2ML IJ SOLN
INTRAMUSCULAR | Status: AC
  Filled 2023-08-30: qty 2

## 2023-08-30 MED ORDER — METHOCARBAMOL 1000 MG/10ML IJ SOLN
500.0000 mg | Freq: Three times a day (TID) | INTRAMUSCULAR | Status: AC
  Administered 2023-09-02: 500 mg via INTRAVENOUS
  Filled 2023-08-30 (×2): qty 10

## 2023-08-30 MED ORDER — TETANUS-DIPHTH-ACELL PERTUSSIS 5-2.5-18.5 LF-MCG/0.5 IM SUSY
0.5000 mL | PREFILLED_SYRINGE | Freq: Once | INTRAMUSCULAR | Status: AC
  Administered 2023-08-30: 0.5 mL via INTRAMUSCULAR

## 2023-08-30 MED ORDER — SUGAMMADEX SODIUM 200 MG/2ML IV SOLN
INTRAVENOUS | Status: DC | PRN
  Administered 2023-08-30: 335 mg via INTRAVENOUS

## 2023-08-30 MED ORDER — DEXAMETHASONE SODIUM PHOSPHATE 10 MG/ML IJ SOLN
INTRAMUSCULAR | Status: DC | PRN
  Administered 2023-08-30: 10 mg via INTRAVENOUS

## 2023-08-30 MED ORDER — METOPROLOL TARTRATE 5 MG/5ML IV SOLN: 5.0000 mg | Freq: Four times a day (QID) | INTRAVENOUS | Status: DC | PRN

## 2023-08-30 MED ORDER — DEXMEDETOMIDINE HCL IN NACL 80 MCG/20ML IV SOLN
INTRAVENOUS | Status: DC | PRN
  Administered 2023-08-30: 12 ug via INTRAVENOUS
  Administered 2023-08-30: 8 ug via INTRAVENOUS
  Administered 2023-08-30: 12 ug via INTRAVENOUS
  Administered 2023-08-30: 8 ug via INTRAVENOUS

## 2023-08-30 MED ORDER — FENTANYL CITRATE (PF) 250 MCG/5ML IJ SOLN
INTRAMUSCULAR | Status: DC | PRN
  Administered 2023-08-30: 50 ug via INTRAVENOUS
  Administered 2023-08-30: 100 ug via INTRAVENOUS
  Administered 2023-08-30 (×2): 50 ug via INTRAVENOUS

## 2023-08-30 MED ORDER — SCOPOLAMINE 1 MG/3DAYS TD PT72
MEDICATED_PATCH | TRANSDERMAL | Status: AC
  Filled 2023-08-30: qty 1

## 2023-08-30 MED ORDER — FENTANYL CITRATE (PF) 100 MCG/2ML IJ SOLN
25.0000 ug | INTRAMUSCULAR | Status: DC | PRN
  Administered 2023-08-30 (×2): 50 ug via INTRAVENOUS

## 2023-08-30 MED ORDER — FENTANYL CITRATE PF 50 MCG/ML IJ SOSY
PREFILLED_SYRINGE | INTRAMUSCULAR | Status: AC
  Filled 2023-08-30: qty 2

## 2023-08-30 MED ORDER — SODIUM CHLORIDE 0.9 % IV SOLN: INTRAVENOUS | Status: AC

## 2023-08-30 MED ORDER — SUCCINYLCHOLINE CHLORIDE 200 MG/10ML IV SOSY
PREFILLED_SYRINGE | INTRAVENOUS | Status: AC
  Filled 2023-08-30: qty 10

## 2023-08-30 MED ORDER — ONDANSETRON HCL 4 MG/2ML IJ SOLN
4.0000 mg | Freq: Four times a day (QID) | INTRAMUSCULAR | Status: DC | PRN
  Administered 2023-09-04: 4 mg via INTRAVENOUS
  Filled 2023-08-30: qty 2

## 2023-08-30 MED ORDER — DEXAMETHASONE SODIUM PHOSPHATE 10 MG/ML IJ SOLN
INTRAMUSCULAR | Status: AC
  Filled 2023-08-30: qty 1

## 2023-08-30 MED ORDER — ROCURONIUM BROMIDE 10 MG/ML (PF) SYRINGE
PREFILLED_SYRINGE | INTRAVENOUS | Status: DC | PRN
  Administered 2023-08-30 (×2): 50 mg via INTRAVENOUS

## 2023-08-30 SURGICAL SUPPLY — 38 items
BNDG ELASTIC 3INX 5YD STR LF (GAUZE/BANDAGES/DRESSINGS) ×1 IMPLANT
BNDG ELASTIC 4INX 5YD STR LF (GAUZE/BANDAGES/DRESSINGS) IMPLANT
BNDG ELASTIC 4X5.8 VLCR STR LF (GAUZE/BANDAGES/DRESSINGS) ×1 IMPLANT
BNDG ESMARK 4X9 LF (GAUZE/BANDAGES/DRESSINGS) ×1 IMPLANT
BNDG GAUZE DERMACEA FLUFF 4 (GAUZE/BANDAGES/DRESSINGS) ×1 IMPLANT
CORD BIPOLAR FORCEPS 12FT (ELECTRODE) ×1 IMPLANT
COVER SURGICAL LIGHT HANDLE (MISCELLANEOUS) ×1 IMPLANT
CUFF TOURN SGL QUICK 18X4 (TOURNIQUET CUFF) ×1 IMPLANT
DRAPE SURG 17X23 STRL (DRAPES) ×1 IMPLANT
DRSG ADAPTIC 3X8 NADH LF (GAUZE/BANDAGES/DRESSINGS) ×1 IMPLANT
ELECT CAUTERY BLADE 6.4 (BLADE) IMPLANT
GAUZE SPONGE 4X4 12PLY STRL (GAUZE/BANDAGES/DRESSINGS) ×1 IMPLANT
GAUZE XEROFORM 1X8 LF (GAUZE/BANDAGES/DRESSINGS) ×1 IMPLANT
GLOVE BIO SURGEON STRL SZ7.5 (GLOVE) ×1 IMPLANT
GLOVE BIOGEL PI IND STRL 7.5 (GLOVE) ×1 IMPLANT
GOWN STRL REUS W/ TWL LRG LVL3 (GOWN DISPOSABLE) ×3 IMPLANT
GOWN STRL REUS W/ TWL XL LVL3 (GOWN DISPOSABLE) ×1 IMPLANT
K-wire (Wire) IMPLANT
KIT BASIN OR (CUSTOM PROCEDURE TRAY) ×1 IMPLANT
KIT TURNOVER KIT B (KITS) ×1 IMPLANT
KWIRE DBL TROCAR .062X4 (WIRE) IMPLANT
MANIFOLD NEPTUNE II (INSTRUMENTS) ×1 IMPLANT
NS IRRIG 1000ML POUR BTL (IV SOLUTION) ×1 IMPLANT
PACK ORTHO EXTREMITY (CUSTOM PROCEDURE TRAY) ×1 IMPLANT
PAD ARMBOARD POSITIONER FOAM (MISCELLANEOUS) ×2 IMPLANT
PAD CAST 4YDX4 CTTN HI CHSV (CAST SUPPLIES) ×1 IMPLANT
PENCIL BUTTON HOLSTER BLD 10FT (ELECTRODE) IMPLANT
SOAP 2 % CHG 4 OZ (WOUND CARE) ×1 IMPLANT
SPONGE T-LAP 18X18 ~~LOC~~+RFID (SPONGE) ×1 IMPLANT
SPONGE T-LAP 4X18 ~~LOC~~+RFID (SPONGE) ×1 IMPLANT
SUT VICRYL RAPIDE 4/0 PS 2 (SUTURE) IMPLANT
SWAB COLLECTION DEVICE MRSA (MISCELLANEOUS) ×1 IMPLANT
TOWEL GREEN STERILE (TOWEL DISPOSABLE) ×1 IMPLANT
TOWEL GREEN STERILE FF (TOWEL DISPOSABLE) ×1 IMPLANT
TUBE CONNECTING 12X1/4 (SUCTIONS) ×1 IMPLANT
UNDERPAD 30X36 HEAVY ABSORB (UNDERPADS AND DIAPERS) ×1 IMPLANT
WATER STERILE IRR 1000ML POUR (IV SOLUTION) ×1 IMPLANT
YANKAUER SUCT BULB TIP NO VENT (SUCTIONS) ×1 IMPLANT
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
## 2023-08-30 NOTE — ED Notes (Signed)
 Tourniquet down 1858
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
## 2023-08-30 NOTE — Progress Notes (Signed)
 Pt's mother and father are in the surgical waiting room at this time.

## 2023-08-30 NOTE — Anesthesia Postprocedure Evaluation (Signed)
 Anesthesia Post Note  Patient: Heather Cherry  Procedure(s) Performed: Left Hand I&D, Other repair as needed (Left)     Patient location during evaluation: PACU Anesthesia Type: General Level of consciousness: awake and alert Pain management: pain level controlled Vital Signs Assessment: post-procedure vital signs reviewed and stable Respiratory status: spontaneous breathing, nonlabored ventilation and respiratory function stable Cardiovascular status: stable and blood pressure returned to baseline Anesthetic complications: no  No notable events documented.  Last Vitals:  Vitals:   08/30/23 2324 08/30/23 2330  BP:  (!) 161/71  Pulse: 92 (!) 102  Resp: 14 15  Temp:  36.9 C  SpO2: 99% 100%    Last Pain:  Vitals:   08/30/23 2330  TempSrc:   PainSc: 6                  Juventino Oppenheim
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
## 2023-08-30 NOTE — ED Notes (Signed)
 [name] (pts mother) [phone]

## 2023-08-30 NOTE — ED Notes (Signed)
 X2 Tourniquets taken down at 1858.

## 2023-08-30 NOTE — Anesthesia Procedure Notes (Signed)
 Procedure Name: Intubation Date/Time: 08/30/2023 8:32 PM  Performed by: Melinda Sprawls, CRNAPre-anesthesia Checklist: Patient identified, Emergency Drugs available, Suction available, Patient being monitored and Timeout performed Patient Re-evaluated:Patient Re-evaluated prior to induction Oxygen Delivery Method: Circle system utilized Preoxygenation: Pre-oxygenation with 100% oxygen Induction Type: IV induction, Rapid sequence and Cricoid Pressure applied Laryngoscope Size: Glidescope and 4 Grade View: Grade I Tube type: Oral Tube size: 7.0 mm Number of attempts: 1 Airway Equipment and Method: Video-laryngoscopy and Stylet Placement Confirmation: ETT inserted through vocal cords under direct vision, positive ETCO2 and breath sounds checked- equal and bilateral Secured at: 23 cm Tube secured with: Tape Dental Injury: Teeth and Oropharynx as per pre-operative assessment  Comments: Smooth IV Induction. Eyes taped. RSI Performed.  DL x 1 with grade 1 view. Blood noticed in the posterior oropharynx upon DL. Pt suctioned and smoothly placed ETT. Teeth and lip remain intact as pre-op. Secured with tape. Bilateral breath sounds +/=, EtCO2 +, Adequate TV, VSS.
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
## 2023-08-30 NOTE — Transfer of Care (Signed)
 Immediate Anesthesia Transfer of Care Note  Patient: Heather Cherry  Procedure(s) Performed: Left Hand I&D, Other repair as needed (Left)  Patient Location: PACU  Anesthesia Type:General  Level of Consciousness: awake and alert   Airway & Oxygen Therapy: Patient Spontanous Breathing and Patient connected to face mask oxygen  Post-op Assessment: Report given to RN and Post -op Vital signs reviewed and stable  Post vital signs: Reviewed and stable  Last Vitals:  Vitals Value Taken Time  BP 152/68 08/30/23 2301  Temp 36.8 C 08/30/23 2300  Pulse 94 08/30/23 2313  Resp 19 08/30/23 2313  SpO2 100 % 08/30/23 2313  Vitals shown include unfiled device data.  Last Pain:  Vitals:   08/30/23 2307  TempSrc:   PainSc: 10-Worst pain ever         Complications: No notable events documented.
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
## 2023-08-30 NOTE — Anesthesia Preprocedure Evaluation (Addendum)
 Anesthesia Evaluation  Patient identified by MRN, date of birth, ID band Patient awake    Reviewed: Allergy & Precautions, NPO status , Patient's Chart, lab work & pertinent test resultsPreop documentation limited or incomplete due to emergent nature of procedure.  History of Anesthesia Complications Negative for: history of anesthetic complications  Airway Mallampati: III  TM Distance: >3 FB Neck ROM: Full    Dental   Pulmonary   Probable OSA, OHS     Pulmonary exam normal        Cardiovascular pulmonary hypertension+ DVT  Normal cardiovascular exam     Neuro/Psych    GI/Hepatic  S/p gastric sleeve 5 days ago    Endo/Other    Class 4 obesity Hypokalemia, K 2.9   Renal/GU      Musculoskeletal   Abdominal  (+) + obese  Peds  Hematology  Recent lovenox     Anesthesia Other Findings   Reproductive/Obstetrics                             Anesthesia Physical Anesthesia Plan  ASA: 3 and emergent  Anesthesia Plan: General   Post-op Pain Management: Ofirmev  IV (intra-op)* and Precedex    Induction: Intravenous and Rapid sequence  PONV Risk Score and Plan: 3 and Treatment may vary due to age or medical condition, Ondansetron , Dexamethasone  and Midazolam   Airway Management Planned: Oral ETT  Additional Equipment: None  Intra-op Plan:   Post-operative Plan: Extubation in OR  Informed Consent:      Only emergency history available  Plan Discussed with: CRNA and Anesthesiologist  Anesthesia Plan Comments: (No OGT or NSAIDs given recent gastric sleeve)        Anesthesia Quick Evaluation

## 2023-08-31 ENCOUNTER — Other Ambulatory Visit: Payer: Self-pay

## 2023-08-31 ENCOUNTER — Inpatient Hospital Stay (HOSPITAL_COMMUNITY)

## 2023-08-31 ENCOUNTER — Encounter (HOSPITAL_COMMUNITY): Payer: Self-pay

## 2023-08-31 LAB — BASIC METABOLIC PANEL WITH GFR
Anion gap: 12 (ref 5–15)
BUN: <5 mg/dL — ABNORMAL LOW (ref 6–20)
CO2: 20 mmol/L — ABNORMAL LOW (ref 22–32)
Calcium: 8.1 mg/dL — ABNORMAL LOW (ref 8.9–10.3)
Chloride: 106 mmol/L (ref 98–111)
Creatinine, Ser: 0.71 mg/dL (ref 0.44–1.00)
GFR, Estimated: >60 mL/min (ref >60)
Glucose, Bld: 124 mg/dL — ABNORMAL HIGH (ref 70–99)
Potassium: 3.1 mmol/L — ABNORMAL LOW (ref 3.5–5.1)
Sodium: 138 mmol/L (ref 135–145)

## 2023-08-31 LAB — CBC
HCT: 33.5 % — ABNORMAL LOW (ref 36.0–46.0)
Hemoglobin: 11 g/dL — ABNORMAL LOW (ref 12.0–15.0)
MCH: 27.8 pg (ref 26.0–34.0)
MCHC: 32.8 g/dL (ref 30.0–36.0)
MCV: 84.6 fL (ref 80.0–100.0)
Platelets: 240 10*3/uL (ref 150–400)
RBC: 3.96 MIL/uL (ref 3.87–5.11)
RDW: 14.3 % (ref 11.5–15.5)
WBC: 8.4 10*3/uL (ref 4.0–10.5)
nRBC: 0 % (ref 0.0–0.2)

## 2023-08-31 LAB — HIV ANTIBODY (ROUTINE TESTING W REFLEX): HIV Screen 4th Generation wRfx: NONREACTIVE

## 2023-08-31 MED ORDER — ENSURE MAX PROTEIN PO LIQD
11.0000 [oz_av] | Freq: Two times a day (BID) | ORAL | Status: DC
Start: 2023-08-31 — End: 2023-09-10
  Administered 2023-08-31 – 2023-09-10 (×13): 11 [oz_av] via ORAL
  Filled 2023-08-31 (×23): qty 330

## 2023-08-31 MED ORDER — VANCOMYCIN HCL 2000 MG/400ML IV SOLN
2000.0000 mg | Freq: Once | INTRAVENOUS | Status: AC
  Administered 2023-08-31: 2000 mg via INTRAVENOUS
  Filled 2023-08-31: qty 400

## 2023-08-31 MED ORDER — ALUM & MAG HYDROXIDE-SIMETH 200-200-20 MG/5ML PO SUSP
30.0000 mL | Freq: Four times a day (QID) | ORAL | Status: DC | PRN
  Administered 2023-08-31 – 2023-09-10 (×9): 30 mL via ORAL
  Filled 2023-08-31 (×10): qty 30

## 2023-08-31 MED ORDER — ACETAMINOPHEN 500 MG PO TABS
1000.0000 mg | ORAL_TABLET | Freq: Four times a day (QID) | ORAL | Status: DC
  Administered 2023-08-31 – 2023-09-10 (×36): 1000 mg via ORAL
  Filled 2023-08-31 (×38): qty 2

## 2023-08-31 MED ORDER — AMLODIPINE BESYLATE 5 MG PO TABS
5.0000 mg | ORAL_TABLET | Freq: Every day | ORAL | Status: DC
  Administered 2023-08-31 – 2023-09-10 (×10): 5 mg via ORAL
  Filled 2023-08-31 (×11): qty 1

## 2023-08-31 MED ORDER — PANTOPRAZOLE SODIUM 40 MG PO TBEC
40.0000 mg | DELAYED_RELEASE_TABLET | Freq: Every day | ORAL | Status: DC
  Administered 2023-08-31 – 2023-09-01 (×2): 40 mg via ORAL
  Filled 2023-08-31 (×2): qty 1

## 2023-08-31 MED ORDER — HYDROXYZINE HCL 25 MG PO TABS
25.0000 mg | ORAL_TABLET | Freq: Three times a day (TID) | ORAL | Status: DC | PRN
  Administered 2023-08-31 – 2023-09-08 (×13): 25 mg via ORAL
  Filled 2023-08-31 (×13): qty 1

## 2023-08-31 MED ORDER — LURASIDONE HCL 20 MG PO TABS
20.0000 mg | ORAL_TABLET | Freq: Every day | ORAL | Status: DC
  Administered 2023-08-31 – 2023-09-09 (×10): 20 mg via ORAL
  Filled 2023-08-31 (×11): qty 1

## 2023-08-31 MED ORDER — VANCOMYCIN HCL 10 G IV SOLR
1750.0000 mg | Freq: Two times a day (BID) | INTRAVENOUS | Status: DC
  Administered 2023-08-31: 1750 mg via INTRAVENOUS
  Filled 2023-08-31: qty 35
  Filled 2023-08-31: qty 17.5

## 2023-08-31 MED ORDER — CALCIUM CARBONATE ANTACID 500 MG PO CHEW
2.0000 | CHEWABLE_TABLET | Freq: Three times a day (TID) | ORAL | Status: DC
  Administered 2023-08-31 – 2023-09-10 (×28): 400 mg via ORAL
  Filled 2023-08-31 (×29): qty 2

## 2023-08-31 MED ORDER — CALCIUM CARBONATE ANTACID 500 MG PO CHEW
1.0000 | CHEWABLE_TABLET | Freq: Three times a day (TID) | ORAL | Status: DC
  Administered 2023-08-31: 200 mg via ORAL
  Filled 2023-08-31: qty 1

## 2023-08-31 MED ORDER — VANCOMYCIN HCL 1750 MG/350ML IV SOLN
1750.0000 mg | Freq: Two times a day (BID) | INTRAVENOUS | Status: DC
  Administered 2023-09-01 – 2023-09-02 (×4): 1750 mg via INTRAVENOUS
  Filled 2023-08-31 (×4): qty 350

## 2023-08-31 MED ORDER — HYDROMORPHONE HCL 1 MG/ML IJ SOLN
INTRAMUSCULAR | Status: AC
  Filled 2023-08-31: qty 1

## 2023-08-31 MED ORDER — METOPROLOL SUCCINATE ER 50 MG PO TB24
100.0000 mg | ORAL_TABLET | Freq: Every day | ORAL | Status: DC
  Administered 2023-08-31 – 2023-09-10 (×11): 100 mg via ORAL
  Filled 2023-08-31 (×11): qty 2

## 2023-08-31 MED ORDER — VANCOMYCIN HCL 1500 MG/300ML IV SOLN
1500.0000 mg | Freq: Three times a day (TID) | INTRAVENOUS | Status: DC
  Filled 2023-08-31: qty 300

## 2023-08-31 MED ORDER — VANCOMYCIN HCL 10 G IV SOLR: 2000.0000 mg | Freq: Once | INTRAVENOUS | Status: DC

## 2023-08-31 MED ORDER — QUETIAPINE FUMARATE 100 MG PO TABS
200.0000 mg | ORAL_TABLET | Freq: Every day | ORAL | Status: DC
  Administered 2023-08-31 – 2023-09-07 (×8): 200 mg via ORAL
  Filled 2023-08-31 (×8): qty 2

## 2023-08-31 MED ORDER — METHIMAZOLE 10 MG PO TABS
30.0000 mg | ORAL_TABLET | Freq: Every day | ORAL | Status: DC
  Administered 2023-08-31 – 2023-09-10 (×11): 30 mg via ORAL
  Filled 2023-08-31 (×11): qty 3

## 2023-08-31 MED ORDER — VANCOMYCIN HCL 1500 MG/300ML IV SOLN
1750.0000 mg | Freq: Two times a day (BID) | INTRAVENOUS | Status: DC
  Filled 2023-08-31: qty 350
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
## 2023-08-31 NOTE — Progress Notes (Signed)
 Pt arrived on the unit in bed from PACU. Pt alert and oriented, but in excruciating pain. Pt covered with blood. Pt cleaned up, and pain medication override and administered to pt. Pt very anxious and emotional. Emotional support given to pt and family. Will continue monitoring.
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
## 2023-08-31 NOTE — Progress Notes (Signed)
 Patient arrived to PACU from OR alert and oriented x4, calm and pleasant. Complained of 10/10 pain to left hand. Patient has good sensation of remaining 3 fingers of left hand, good cap refill and is able to wiggle fingers. Dressing is clean, dry and intact. Medicated patient per PACU orders for pain. 2330 Dr. Delmar Ferrara at bedside to inform patient how the surgery went and inform her that her left ring and pinky fingers were amputated. Patient received information calmly, although visibly upset. Report called to 4NP receiving nurse Marigene Shoulder after Dr. Delmar Ferrara talked with patient. Patient transported to 4NP via hospital bed at 2343 and met in room by receiving nurse Marigene Shoulder, RN. Updated bedside report and visual assessment of patient completed with Gladys, RN. Patient is beginning to experience pain again after movement to unit but remains calm and cooperative. Offered further assistance to [employer] and she reported that none was needed. Informed Gladys that I would attempt to find patients parents and bring them to 4[facility] waiting area. 0015 Called Gladys to inform that I had been unable to locate patients parents and no one answered the number that was in the chart.

## 2023-08-31 NOTE — Progress Notes (Signed)
 Postop day 1 status post left mangled hand I&D and repair.  The patient is awake and alert this morning.  She can gently flex and extend the thumb index and middle fingers.  She states she can feel sensation however it is altered to the tips of all 3 remaining digits.  These digits remain pink and warm with brisk capillary refill.  The bandage has some drainage as expected which has been reinforced.  We would very much not like to change the bandage today as it would be exquisitely painful and would likely bleed a significant amount.  We will plan for bandage change however the patient will almost certainly not be able to tolerate that due to the severity of the injury and may need to be done in the operating room at the time of second debridement.  The patient will likely need a secondary debridement and potential flap coverage.  Encourage early ambulation.  The patient will be nonweightbearing to the left upper extremity.  [facility] called for a sling when she is up and about and Carter arm elevation pillow when she is in bed.  Zeno Hikes, MD

## 2023-08-31 NOTE — Progress Notes (Signed)
 Orthopedic Tech Progress Note Patient Details:  Tahiry Spicer [birth year] [id number]  Ortho Devices Type of Ortho Device: Arm sling, Other (comment) Ortho Device/Splint Location: LUE Ortho Device/Splint Interventions: Ordered, Other (comment)OR MD called this morning given me a verbal order for a CARTER PILLOW (from OR) as well as an ARM SLING. PT was at the door and I passed them the arm sling as well as pillow. Notified RN    Post Interventions Patient Tolerated: Fair Instructions Provided: Care of device  Kermitt Pedlar 08/31/2023, 9:13 AM
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
## 2023-08-31 NOTE — Progress Notes (Addendum)
 Patient c/o numbness and tightness on her left surgical hand. She is to flex and extend her fingers. She is also expressed that she is having anxiety and request for the doctor to see if they can order something to relax her. All prn medications has been administered. Both trauma team and ortho team notified per request. Emotional support given.    Left messaged with Moira Andrews from Dr. Freddie Jaegers office in regards to pt's hand. Order received for anxiety. Medication administered.

## 2023-08-31 NOTE — Progress Notes (Signed)
 Patient is requesting to have NPO order removed and her "gastric bypass medication of tums-2 tablets 3 times a day, peptobismo- every 6 hours, and prilosec 40mg -once a day). Spoke to Trauma PA. Will have pharmacy reconciled home medications and rounding team will have orders in appropriately. Patient updated.
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
## 2023-09-01 ENCOUNTER — Encounter (HOSPITAL_COMMUNITY): Payer: Self-pay

## 2023-09-01 LAB — BASIC METABOLIC PANEL WITH GFR
Anion gap: 9 (ref 5–15)
BUN: <5 mg/dL — ABNORMAL LOW (ref 6–20)
CO2: 23 mmol/L (ref 22–32)
Calcium: 8 mg/dL — ABNORMAL LOW (ref 8.9–10.3)
Chloride: 106 mmol/L (ref 98–111)
Creatinine, Ser: 0.64 mg/dL (ref 0.44–1.00)
GFR, Estimated: >60 mL/min (ref >60)
Glucose, Bld: 117 mg/dL — ABNORMAL HIGH (ref 70–99)
Potassium: 3.2 mmol/L — ABNORMAL LOW (ref 3.5–5.1)
Sodium: 138 mmol/L (ref 135–145)

## 2023-09-01 LAB — CBC
HCT: 29.7 % — ABNORMAL LOW (ref 36.0–46.0)
Hemoglobin: 9.6 g/dL — ABNORMAL LOW (ref 12.0–15.0)
MCH: 27.9 pg (ref 26.0–34.0)
MCHC: 32.3 g/dL (ref 30.0–36.0)
MCV: 86.3 fL (ref 80.0–100.0)
Platelets: 197 10*3/uL (ref 150–400)
RBC: 3.44 MIL/uL — ABNORMAL LOW (ref 3.87–5.11)
RDW: 14.6 % (ref 11.5–15.5)
WBC: 6.5 10*3/uL (ref 4.0–10.5)
nRBC: 0 % (ref 0.0–0.2)

## 2023-09-01 MED ORDER — NYSTATIN 100000 UNIT/ML MT SUSP
5.0000 mL | Freq: Four times a day (QID) | OROMUCOSAL | Status: AC
  Administered 2023-09-01 – 2023-09-03 (×9): 500000 [IU] via ORAL
  Filled 2023-09-01 (×13): qty 5

## 2023-09-01 MED ORDER — POTASSIUM CHLORIDE CRYS ER 20 MEQ PO TBCR
40.0000 meq | EXTENDED_RELEASE_TABLET | Freq: Two times a day (BID) | ORAL | Status: AC
  Administered 2023-09-01 (×2): 40 meq via ORAL
  Filled 2023-09-01 (×2): qty 2

## 2023-09-01 MED ORDER — PANTOPRAZOLE SODIUM 40 MG PO TBEC
40.0000 mg | DELAYED_RELEASE_TABLET | Freq: Two times a day (BID) | ORAL | Status: DC
  Administered 2023-09-01 – 2023-09-10 (×18): 40 mg via ORAL
  Filled 2023-09-01 (×18): qty 1
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
## 2023-09-01 NOTE — Plan of Care (Signed)
  Problem: Education: Goal: Knowledge of General Education information will improve Description: Including pain rating scale, medication(s)/side effects and non-pharmacologic comfort measures 09/01/2023 0640 by Alejos Husband, RN Outcome: Progressing 08/31/2023 1958 by Alejos Husband, RN Outcome: Progressing   Problem: Health Behavior/Discharge Planning: Goal: Ability to manage health-related needs will improve 09/01/2023 0640 by Alejos Husband, RN Outcome: Progressing 08/31/2023 1958 by Alejos Husband, RN Outcome: Progressing   Problem: Clinical Measurements: Goal: Ability to maintain clinical measurements within normal limits will improve 09/01/2023 0640 by Alejos Husband, RN Outcome: Progressing 08/31/2023 1958 by Alejos Husband, RN Outcome: Progressing Goal: Will remain free from infection 09/01/2023 0640 by Alejos Husband, RN Outcome: Progressing 08/31/2023 1958 by Alejos Husband, RN Outcome: Progressing Goal: Diagnostic test results will improve 09/01/2023 0640 by Alejos Husband, RN Outcome: Progressing 08/31/2023 1958 by Alejos Husband, RN Outcome: Progressing Goal: Respiratory complications will improve 09/01/2023 0640 by Alejos Husband, RN Outcome: Progressing 08/31/2023 1958 by Alejos Husband, RN Outcome: Progressing Goal: Cardiovascular complication will be avoided 09/01/2023 0640 by Alejos Husband, RN Outcome: Progressing 08/31/2023 1958 by Alejos Husband, RN Outcome: Progressing   Problem: Activity: Goal: Risk for activity intolerance will decrease 09/01/2023 0640 by Alejos Husband, RN Outcome: Progressing 08/31/2023 1958 by Alejos Husband, RN Outcome: Progressing   Problem: Nutrition: Goal: Adequate nutrition will be maintained 09/01/2023 0640 by Alejos Husband, RN Outcome: Progressing 08/31/2023 1958 by Alejos Husband, RN Outcome: Progressing   Problem: Coping: Goal: Level of anxiety will  decrease 09/01/2023 0640 by Alejos Husband, RN Outcome: Progressing 08/31/2023 1958 by Alejos Husband, RN Outcome: Progressing   Problem: Elimination: Goal: Will not experience complications related to bowel motility 09/01/2023 0640 by Alejos Husband, RN Outcome: Progressing 08/31/2023 1958 by Alejos Husband, RN Outcome: Progressing Goal: Will not experience complications related to urinary retention 09/01/2023 0640 by Alejos Husband, RN Outcome: Progressing 08/31/2023 1958 by Alejos Husband, RN Outcome: Progressing   Problem: Pain Managment: Goal: General experience of comfort will improve and/or be controlled 09/01/2023 0640 by Alejos Husband, RN Outcome: Progressing 08/31/2023 1958 by Alejos Husband, RN Outcome: Progressing   Problem: Safety: Goal: Ability to remain free from injury will improve 09/01/2023 0640 by Alejos Husband, RN Outcome: Progressing 08/31/2023 1958 by Alejos Husband, RN Outcome: Progressing   Problem: Skin Integrity: Goal: Risk for impaired skin integrity will decrease 09/01/2023 0640 by Alejos Husband, RN Outcome: Progressing 08/31/2023 1958 by Alejos Husband, RN Outcome: Progressing

## 2023-09-02 ENCOUNTER — Encounter (HOSPITAL_COMMUNITY): Payer: Self-pay

## 2023-09-02 LAB — CBC
HCT: 31 % — ABNORMAL LOW (ref 36.0–46.0)
Hemoglobin: 9.9 g/dL — ABNORMAL LOW (ref 12.0–15.0)
MCH: 27.5 pg (ref 26.0–34.0)
MCHC: 31.9 g/dL (ref 30.0–36.0)
MCV: 86.1 fL (ref 80.0–100.0)
Platelets: 206 10*3/uL (ref 150–400)
RBC: 3.6 MIL/uL — ABNORMAL LOW (ref 3.87–5.11)
RDW: 14.6 % (ref 11.5–15.5)
WBC: 5.7 10*3/uL (ref 4.0–10.5)
nRBC: 0 % (ref 0.0–0.2)

## 2023-09-02 LAB — BASIC METABOLIC PANEL WITH GFR
Anion gap: 6 (ref 5–15)
BUN: <5 mg/dL — ABNORMAL LOW (ref 6–20)
CO2: 28 mmol/L (ref 22–32)
Calcium: 8.3 mg/dL — ABNORMAL LOW (ref 8.9–10.3)
Chloride: 105 mmol/L (ref 98–111)
Creatinine, Ser: 0.6 mg/dL (ref 0.44–1.00)
GFR, Estimated: >60 mL/min (ref >60)
Glucose, Bld: 96 mg/dL (ref 70–99)
Potassium: 3.3 mmol/L — ABNORMAL LOW (ref 3.5–5.1)
Sodium: 139 mmol/L (ref 135–145)

## 2023-09-02 LAB — MAGNESIUM: Magnesium: 1.7 mg/dL (ref 1.7–2.4)

## 2023-09-02 MED ORDER — OXYCODONE HCL 5 MG PO TABS
5.0000 mg | ORAL_TABLET | ORAL | Status: DC | PRN
  Administered 2023-09-02 – 2023-09-06 (×12): 10 mg via ORAL
  Filled 2023-09-02 (×13): qty 2
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
## 2023-09-02 NOTE — Plan of Care (Signed)

## 2023-09-03 MED ORDER — MAGNESIUM SULFATE 4 GM/100ML IV SOLN
4.0000 g | Freq: Once | INTRAVENOUS | Status: AC
  Administered 2023-09-03: 4 g via INTRAVENOUS
  Filled 2023-09-03: qty 100

## 2023-09-03 MED ORDER — POTASSIUM CHLORIDE 10 MEQ/100ML IV SOLN
10.0000 meq | INTRAVENOUS | Status: DC
  Filled 2023-09-03 (×4): qty 100

## 2023-09-03 MED ORDER — POTASSIUM CHLORIDE CRYS ER 20 MEQ PO TBCR
40.0000 meq | EXTENDED_RELEASE_TABLET | Freq: Once | ORAL | Status: AC
  Administered 2023-09-03: 40 meq via ORAL
  Filled 2023-09-03: qty 2

## 2023-09-03 MED ORDER — HYDROMORPHONE HCL 1 MG/ML IJ SOLN
0.5000 mg | Freq: Three times a day (TID) | INTRAMUSCULAR | Status: DC | PRN
  Administered 2023-09-03 – 2023-09-04 (×3): 0.5 mg via INTRAVENOUS
  Filled 2023-09-03 (×4): qty 0.5

## 2023-09-03 NOTE — ED Notes (Signed)
 [facility]lower deliver in ed for this patient and need to to see what room pt was in to send flowers
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
## 2023-09-03 NOTE — Plan of Care (Signed)

## 2023-09-04 ENCOUNTER — Encounter (HOSPITAL_COMMUNITY): Payer: Self-pay

## 2023-09-04 ENCOUNTER — Other Ambulatory Visit: Payer: Self-pay

## 2023-09-04 ENCOUNTER — Inpatient Hospital Stay (HOSPITAL_COMMUNITY)

## 2023-09-04 ENCOUNTER — Encounter (HOSPITAL_COMMUNITY): Admission: EM | Disposition: A | Discharge status: Home / Self Care | Payer: Self-pay | Source: Home / Self Care

## 2023-09-04 DIAGNOSIS — G4733 Obstructive sleep apnea (adult) (pediatric): Secondary | ICD-10-CM | POA: Diagnosis not present

## 2023-09-04 DIAGNOSIS — S61401A Unspecified open wound of right hand, initial encounter: Secondary | ICD-10-CM

## 2023-09-04 DIAGNOSIS — I10 Essential (primary) hypertension: Secondary | ICD-10-CM

## 2023-09-04 HISTORY — PX: APPLICATION OF WOUND VAC: SHX5189

## 2023-09-04 HISTORY — PX: INCISION AND DRAINAGE OF WOUND: SHX1803

## 2023-09-04 LAB — BASIC METABOLIC PANEL WITH GFR
Anion gap: 9 (ref 5–15)
BUN: <5 mg/dL — ABNORMAL LOW (ref 6–20)
CO2: 28 mmol/L (ref 22–32)
Calcium: 7.9 mg/dL — ABNORMAL LOW (ref 8.9–10.3)
Chloride: 101 mmol/L (ref 98–111)
Creatinine, Ser: 0.63 mg/dL (ref 0.44–1.00)
GFR, Estimated: >60 mL/min (ref >60)
Glucose, Bld: 106 mg/dL — ABNORMAL HIGH (ref 70–99)
Potassium: 3.3 mmol/L — ABNORMAL LOW (ref 3.5–5.1)
Sodium: 138 mmol/L (ref 135–145)

## 2023-09-04 SURGERY — IRRIGATION AND DEBRIDEMENT WOUND
Anesthesia: General | Site: Hand | Laterality: Left

## 2023-09-04 MED ORDER — PROPOFOL 10 MG/ML IV BOLUS
INTRAVENOUS | Status: DC | PRN
Start: 2023-09-04 — End: 2023-09-04
  Administered 2023-09-04: 200 mg via INTRAVENOUS
  Administered 2023-09-04: 100 mg via INTRAVENOUS

## 2023-09-04 MED ORDER — PROPOFOL 10 MG/ML IV BOLUS
INTRAVENOUS | Status: AC
Start: 2023-09-04 — End: ?
  Filled 2023-09-04: qty 20

## 2023-09-04 MED ORDER — LACTATED RINGERS IV SOLN: INTRAVENOUS | Status: DC

## 2023-09-04 MED ORDER — DEXMEDETOMIDINE HCL IN NACL 80 MCG/20ML IV SOLN
INTRAVENOUS | Status: DC | PRN
  Administered 2023-09-04: 4 ug via INTRAVENOUS
  Administered 2023-09-04 (×2): 8 ug via INTRAVENOUS

## 2023-09-04 MED ORDER — FENTANYL CITRATE (PF) 100 MCG/2ML IJ SOLN
25.0000 ug | INTRAMUSCULAR | Status: DC | PRN
  Administered 2023-09-04 (×3): 50 ug via INTRAVENOUS

## 2023-09-04 MED ORDER — HYDROMORPHONE HCL 1 MG/ML IJ SOLN
1.0000 mg | INTRAMUSCULAR | Status: DC | PRN
  Administered 2023-09-04 – 2023-09-06 (×7): 1 mg via INTRAVENOUS
  Filled 2023-09-04 (×8): qty 1

## 2023-09-04 MED ORDER — LIDOCAINE HCL (PF) 1 % IJ SOLN
INTRAMUSCULAR | Status: AC
  Filled 2023-09-04: qty 30

## 2023-09-04 MED ORDER — FENTANYL CITRATE (PF) 250 MCG/5ML IJ SOLN
INTRAMUSCULAR | Status: AC
  Filled 2023-09-04: qty 5

## 2023-09-04 MED ORDER — BACITRACIN ZINC 500 UNIT/GM EX OINT
TOPICAL_OINTMENT | CUTANEOUS | Status: AC
  Filled 2023-09-04: qty 28.35

## 2023-09-04 MED ORDER — ACETAMINOPHEN 10 MG/ML IV SOLN
1000.0000 mg | Freq: Once | INTRAVENOUS | Status: DC | PRN
  Administered 2023-09-04: 1000 mg via INTRAVENOUS

## 2023-09-04 MED ORDER — PROPOFOL 10 MG/ML IV BOLUS
INTRAVENOUS | Status: AC
  Filled 2023-09-04: qty 20

## 2023-09-04 MED ORDER — HYDROMORPHONE HCL 1 MG/ML IJ SOLN
0.2500 mg | INTRAMUSCULAR | Status: DC | PRN
  Administered 2023-09-04 (×3): 0.5 mg via INTRAVENOUS

## 2023-09-04 MED ORDER — OXYCODONE HCL 5 MG/5ML PO SOLN: 5.0000 mg | Freq: Once | ORAL | Status: DC | PRN

## 2023-09-04 MED ORDER — MIDAZOLAM HCL 5 MG/5ML IJ SOLN
INTRAMUSCULAR | Status: DC | PRN
  Administered 2023-09-04: 2 mg via INTRAVENOUS

## 2023-09-04 MED ORDER — HYDROMORPHONE HCL 1 MG/ML IJ SOLN
INTRAMUSCULAR | Status: AC
  Filled 2023-09-04: qty 1

## 2023-09-04 MED ORDER — POTASSIUM CHLORIDE CRYS ER 20 MEQ PO TBCR
40.0000 meq | EXTENDED_RELEASE_TABLET | ORAL | Status: AC
  Administered 2023-09-04 (×2): 40 meq via ORAL
  Filled 2023-09-04 (×2): qty 2

## 2023-09-04 MED ORDER — CHLORHEXIDINE GLUCONATE 0.12 % MT SOLN
OROMUCOSAL | Status: AC
  Filled 2023-09-04: qty 15

## 2023-09-04 MED ORDER — BUPIVACAINE HCL (PF) 0.25 % IJ SOLN
INTRAMUSCULAR | Status: AC
  Filled 2023-09-04: qty 30

## 2023-09-04 MED ORDER — CEFAZOLIN SODIUM-DEXTROSE 3-4 GM/150ML-% IV SOLN
3.0000 g | INTRAVENOUS | Status: DC
  Filled 2023-09-04: qty 150

## 2023-09-04 MED ORDER — MIDAZOLAM HCL 2 MG/2ML IJ SOLN
INTRAMUSCULAR | Status: AC
  Filled 2023-09-04: qty 2

## 2023-09-04 MED ORDER — SUCCINYLCHOLINE CHLORIDE 200 MG/10ML IV SOSY
PREFILLED_SYRINGE | INTRAVENOUS | Status: DC | PRN
  Administered 2023-09-04: 140 mg via INTRAVENOUS

## 2023-09-04 MED ORDER — FENTANYL CITRATE (PF) 100 MCG/2ML IJ SOLN
INTRAMUSCULAR | Status: AC
  Filled 2023-09-04: qty 2

## 2023-09-04 MED ORDER — HYDROMORPHONE HCL 1 MG/ML IJ SOLN
INTRAMUSCULAR | Status: DC | PRN
  Administered 2023-09-04: .5 mg via INTRAVENOUS

## 2023-09-04 MED ORDER — FENTANYL CITRATE (PF) 250 MCG/5ML IJ SOLN
INTRAMUSCULAR | Status: DC | PRN
  Administered 2023-09-04: 100 ug via INTRAVENOUS
  Administered 2023-09-04: 50 ug via INTRAVENOUS
  Administered 2023-09-04: 100 ug via INTRAVENOUS

## 2023-09-04 MED ORDER — ROCURONIUM BROMIDE 100 MG/10ML IV SOLN
INTRAVENOUS | Status: DC | PRN
  Administered 2023-09-04: 40 mg via INTRAVENOUS

## 2023-09-04 MED ORDER — VASHE WOUND IRRIGATION OPTIME
TOPICAL | Status: DC | PRN
  Administered 2023-09-04: 34 [oz_av]

## 2023-09-04 MED ORDER — ORAL CARE MOUTH RINSE: 15.0000 mL | Freq: Once | OROMUCOSAL | Status: AC

## 2023-09-04 MED ORDER — 0.9 % SODIUM CHLORIDE (POUR BTL) OPTIME
TOPICAL | Status: DC | PRN
  Administered 2023-09-04: 1000 mL

## 2023-09-04 MED ORDER — POTASSIUM CHLORIDE CRYS ER 20 MEQ PO TBCR
40.0000 meq | EXTENDED_RELEASE_TABLET | Freq: Every day | ORAL | Status: DC
  Administered 2023-09-05 – 2023-09-10 (×6): 40 meq via ORAL
  Filled 2023-09-04 (×6): qty 2

## 2023-09-04 MED ORDER — SODIUM CHLORIDE 0.9 % IV SOLN: INTRAVENOUS | Status: DC | PRN

## 2023-09-04 MED ORDER — CHLORHEXIDINE GLUCONATE 0.12 % MT SOLN
15.0000 mL | Freq: Once | OROMUCOSAL | Status: AC
  Administered 2023-09-04: 15 mL via OROMUCOSAL

## 2023-09-04 MED ORDER — DEXAMETHASONE SODIUM PHOSPHATE 10 MG/ML IJ SOLN
INTRAMUSCULAR | Status: DC | PRN
  Administered 2023-09-04: 10 mg via INTRAVENOUS

## 2023-09-04 MED ORDER — CHLORHEXIDINE GLUCONATE 4 % EX SOLN
60.0000 mL | Freq: Once | CUTANEOUS | Status: DC
  Filled 2023-09-04 (×2): qty 60

## 2023-09-04 MED ORDER — SUGAMMADEX SODIUM 200 MG/2ML IV SOLN
INTRAVENOUS | Status: DC | PRN
  Administered 2023-09-04: 400 mg via INTRAVENOUS

## 2023-09-04 MED ORDER — OXYCODONE HCL 5 MG PO TABS: 5.0000 mg | ORAL_TABLET | Freq: Once | ORAL | Status: DC | PRN

## 2023-09-04 MED ORDER — HYDROMORPHONE HCL 1 MG/ML IJ SOLN
INTRAMUSCULAR | Status: AC
  Filled 2023-09-04: qty 0.5

## 2023-09-04 SURGICAL SUPPLY — 43 items
BAG COUNTER SPONGE SURGICOUNT (BAG) ×2 IMPLANT
BLADE CLIPPER SURG (BLADE) IMPLANT
BNDG ELASTIC 3INX 5YD STR LF (GAUZE/BANDAGES/DRESSINGS) ×2 IMPLANT
BNDG ELASTIC 4X5.8 VLCR STR LF (GAUZE/BANDAGES/DRESSINGS) ×2 IMPLANT
BNDG ESMARK 4X9 LF (GAUZE/BANDAGES/DRESSINGS) ×2 IMPLANT
BNDG GAUZE DERMACEA FLUFF 4 (GAUZE/BANDAGES/DRESSINGS) ×2 IMPLANT
CANISTER SUCTION 3000ML PPV (SUCTIONS) ×2 IMPLANT
CLEANSER WND VASHE INSTL 34OZ (WOUND CARE) IMPLANT
CORD BIPOLAR FORCEPS 12FT (ELECTRODE) ×2 IMPLANT
COVER SURGICAL LIGHT HANDLE (MISCELLANEOUS) ×2 IMPLANT
CUFF TOURN SGL QUICK 18X4 (TOURNIQUET CUFF) ×2 IMPLANT
CUFF TRNQT CYL 24X4X16.5-23 (TOURNIQUET CUFF) IMPLANT
DRAPE DERMATAC (DRAPES) IMPLANT
DRAPE OEC MINIVIEW 54X84 (DRAPES) ×2 IMPLANT
DRAPE SURG 17X23 STRL (DRAPES) ×2 IMPLANT
DRESSING VERAFLO CLEANS CC MED (GAUZE/BANDAGES/DRESSINGS) IMPLANT
DRSG ADAPTIC 3X8 NADH LF (GAUZE/BANDAGES/DRESSINGS) ×2 IMPLANT
DRSG EMULSION OIL 3X3 NADH (GAUZE/BANDAGES/DRESSINGS) ×2 IMPLANT
GAUZE SPONGE 4X4 12PLY STRL (GAUZE/BANDAGES/DRESSINGS) ×2 IMPLANT
GLOVE BIO SURGEON STRL SZ7.5 (GLOVE) ×2 IMPLANT
GLOVE BIOGEL PI IND STRL 7.5 (GLOVE) ×2 IMPLANT
GOWN STRL REUS W/ TWL LRG LVL3 (GOWN DISPOSABLE) ×4 IMPLANT
GRAFT SKIN WND SURGICLOSE M95 (Tissue) IMPLANT
HIBICLENS CHG 4% 4OZ BTL (MISCELLANEOUS) ×2 IMPLANT
KIT BASIN OR (CUSTOM PROCEDURE TRAY) ×2 IMPLANT
KIT TURNOVER KIT B (KITS) ×2 IMPLANT
MANIFOLD NEPTUNE II (INSTRUMENTS) ×2 IMPLANT
NDL HYPO 25GX1X1/2 BEV (NEEDLE) ×2 IMPLANT
NEEDLE HYPO 25GX1X1/2 BEV (NEEDLE) ×2 IMPLANT
NS IRRIG 1000ML POUR BTL (IV SOLUTION) ×2 IMPLANT
PACK ORTHO EXTREMITY (CUSTOM PROCEDURE TRAY) ×2 IMPLANT
PAD ARMBOARD POSITIONER FOAM (MISCELLANEOUS) ×4 IMPLANT
PAD CAST 3X4 CTTN HI CHSV (CAST SUPPLIES) ×2 IMPLANT
PAD CAST 4YDX4 CTTN HI CHSV (CAST SUPPLIES) ×2 IMPLANT
PAD NEG PRESSURE SENSATRAC (MISCELLANEOUS) IMPLANT
SUT ETHILON 4 0 PS 2 18 (SUTURE) ×2 IMPLANT
SUT VICRYL RAPIDE 4/0 PS 2 (SUTURE) IMPLANT
SYR CONTROL 10ML LL (SYRINGE) IMPLANT
TOWEL GREEN STERILE (TOWEL DISPOSABLE) ×2 IMPLANT
TOWEL GREEN STERILE FF (TOWEL DISPOSABLE) ×2 IMPLANT
TUBE CONNECTING 12X1/4 (SUCTIONS) ×2 IMPLANT
UNDERPAD 30X36 HEAVY ABSORB (UNDERPADS AND DIAPERS) ×2 IMPLANT
WATER STERILE IRR 1000ML POUR (IV SOLUTION) ×2 IMPLANT
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
## 2023-09-04 NOTE — Progress Notes (Signed)
 Removed 19 staples from pts abdomen from her bariatric surgery

## 2023-09-04 NOTE — Anesthesia Preprocedure Evaluation (Addendum)
 Anesthesia Evaluation  Patient identified by MRN, date of birth, ID band Patient awake    Reviewed: Allergy & Precautions, NPO status , Patient's Chart, lab work & pertinent test resultsPreop documentation limited or incomplete due to emergent nature of procedure.  History of Anesthesia Complications Negative for: history of anesthetic complications  Airway Mallampati: II  TM Distance: >3 FB Neck ROM: Full    Dental  (+) Dental Advisory Given, Teeth Intact   Pulmonary neg shortness of breath, neg sleep apnea, neg COPD, neg recent URI  Probable OSA, OHS     breath sounds clear to auscultation       Cardiovascular pulmonary hypertension+ DVT   Rhythm:Regular     Neuro/Psych negative neurological ROS  negative psych ROS   GI/Hepatic ,GERD  ,, S/p gastric sleeve 5 days ago    Endo/Other    Class 4 obesity Hypokalemia, K 2.9   Renal/GU      Musculoskeletal   Abdominal  (+) + obese  Peds  Hematology  (+) Blood dyscrasia, anemia Lab Results      Component                Value               Date                      WBC                      5.7                 09/02/2023                HGB                      9.9 (L)             09/02/2023                HCT                      31.0 (L)            09/02/2023                MCV                      86.1                09/02/2023                PLT                      206                 09/02/2023               Anesthesia Other Findings   Reproductive/Obstetrics                             Anesthesia Physical Anesthesia Plan  ASA: 3  Anesthesia Plan: General   Post-op Pain Management: Ofirmev  IV (intra-op)* and Precedex    Induction: Intravenous  PONV Risk Score and Plan: 4 or greater and Treatment may vary due to age or medical condition, Ondansetron , Dexamethasone  and Midazolam   Airway Management Planned: Oral  ETT  Additional Equipment: None  Intra-op  Plan:   Post-operative Plan: Extubation in OR  Informed Consent: I have reviewed the patients History and Physical, chart, labs and discussed the procedure including the risks, benefits and alternatives for the proposed anesthesia with the patient or authorized representative who has indicated his/her understanding and acceptance.     Dental advisory given  Plan Discussed with: CRNA and Anesthesiologist  Anesthesia Plan Comments: (No OGT or NSAIDs given recent gastric sleeve)        Anesthesia Quick Evaluation
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
## 2023-09-04 NOTE — Progress Notes (Signed)
 Postop day 5 status post left hand I&D and autograft coverage and ORIF of the 2nd through 5th CMC joints and capitate.  The patient's bandage remains clean dry and intact with some sanguinous drainage as expected.  She can weakly flex and extend the tips of the exposed thumb index and middle fingers.  She has altered sensation in these digits.  There is brisk capillary refill and the digits are warm and well-perfused.  Her pain is much more controlled now and is in good spirits.  She is awake alert and oriented.  Plan for takeback to the operating room today for staged procedure.  Plan for repeat I&D of the skin and subcutaneous tissues and bone.  Plan for possible revision fixation depending on the status of her fracture alignment.  Possible skin graft or skin graft substitute coverage.  Possible wound VAC.  The risks and benefits of surgery were carefully explained including, but not limited to risks of infection, injury to nerves, blood vessels, neighboring structures, recurrence or continued symptoms, loss of motion or strength and the need for rehabilitation or further surgery. After thorough discussion and all questions were answered informed consent was obtained.   Miller Allis, MD Orthopaedic Hand Surgeon

## 2023-09-04 NOTE — Transfer of Care (Signed)
 Immediate Anesthesia Transfer of Care Note  Patient: Heather Cherry  Procedure(s) Performed: IRRIGATION AND DEBRIDEMENT WOUND (Left) APPLICATION, WOUND VAC (Left: Hand)  Patient Location: PACU  Anesthesia Type:General  Level of Consciousness: awake, alert , oriented, and patient cooperative  Airway & Oxygen Therapy: Patient Spontanous Breathing and Patient connected to face mask oxygen  Post-op Assessment: Report given to RN, Post -op Vital signs reviewed and stable, Patient moving all extremities, Patient moving all extremities X 4, and Patient able to stick tongue midline  Post vital signs: Reviewed and stable  Last Vitals:  Vitals Value Taken Time  BP 147/64 09/04/23 0900  Temp 37.1 C 09/04/23 0900  Pulse 113 09/04/23 0904  Resp 16 09/04/23 0904  SpO2 90 % 09/04/23 0904  Vitals shown include unfiled device data.  Last Pain:  Vitals:   09/04/23 0437  TempSrc: Oral  PainSc:       Patients Stated Pain Goal: 0 (09/02/23 1546)  Complications: No notable events documented.

## 2023-09-04 NOTE — Anesthesia Procedure Notes (Signed)
 Procedure Name: Intubation Date/Time: 09/04/2023 8:18 AM  Performed by: Hebert Littler, CRNAPre-anesthesia Checklist: Patient identified, Emergency Drugs available, Suction available, Patient being monitored and Timeout performed Patient Re-evaluated:Patient Re-evaluated prior to induction Oxygen Delivery Method: Circle system utilized Preoxygenation: Pre-oxygenation with 100% oxygen Induction Type: IV induction Laryngoscope Size: Glidescope and 3 Tube type: Oral Tube size: 7.0 mm Number of attempts: 1 Airway Equipment and Method: Video-laryngoscopy Placement Confirmation: ETT inserted through vocal cords under direct vision, positive ETCO2, CO2 detector and breath sounds checked- equal and bilateral Secured at: 22 cm Tube secured with: Tape

## 2023-09-04 NOTE — Progress Notes (Signed)
Pt was transferred to OR

## 2023-09-04 NOTE — Progress Notes (Signed)
 Pharmacy Electrolyte Replacement  Recent Labs:  Recent Labs    09/02/23 1444 09/04/23 0536  K  --  3.3*  MG 1.7  --   CREATININE  --  0.63    Low Critical Values (K </= 2.5, Phos </= 1, Mg </= 1) Present: K = 3.2  MD Contacted: Dr. Davonna Estes  Plan: KCL 40meq x2 today then 40meq daily  Ivery Marking, PharmD, BCIDP, AAHIVP, CPP Infectious Disease Pharmacist 09/04/2023 3:30 PM
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
## 2023-09-05 MED ORDER — CALCIUM GLUCONATE-NACL 2-0.675 GM/100ML-% IV SOLN
2.0000 g | Freq: Once | INTRAVENOUS | Status: AC
  Administered 2023-09-05: 2000 mg via INTRAVENOUS
  Filled 2023-09-05: qty 100

## 2023-09-05 MED ORDER — METHOCARBAMOL 500 MG PO TABS
1000.0000 mg | ORAL_TABLET | Freq: Three times a day (TID) | ORAL | Status: DC
  Administered 2023-09-05 – 2023-09-07 (×7): 1000 mg via ORAL
  Filled 2023-09-05 (×7): qty 2

## 2023-09-05 MED ORDER — LIDOCAINE 5 % EX PTCH
2.0000 | MEDICATED_PATCH | CUTANEOUS | Status: DC
  Administered 2023-09-05 – 2023-09-07 (×3): 2 via TRANSDERMAL
  Filled 2023-09-05 (×3): qty 2

## 2023-09-05 MED ORDER — BISACODYL 5 MG PO TBEC
10.0000 mg | DELAYED_RELEASE_TABLET | Freq: Once | ORAL | Status: AC
  Administered 2023-09-05: 10 mg via ORAL
  Filled 2023-09-05: qty 2

## 2023-09-05 MED ORDER — TAB-A-VITE/IRON PO TABS
1.0000 | ORAL_TABLET | Freq: Every day | ORAL | Status: DC
  Administered 2023-09-05 – 2023-09-10 (×6): 1 via ORAL
  Filled 2023-09-05 (×6): qty 1

## 2023-09-05 MED ORDER — GABAPENTIN 300 MG PO CAPS
600.0000 mg | ORAL_CAPSULE | Freq: Three times a day (TID) | ORAL | Status: DC
  Administered 2023-09-05 – 2023-09-07 (×7): 600 mg via ORAL
  Filled 2023-09-05: qty 6
  Filled 2023-09-05 (×6): qty 2

## 2023-09-05 MED ORDER — POLYETHYLENE GLYCOL 3350 17 G PO PACK
17.0000 g | PACK | Freq: Two times a day (BID) | ORAL | Status: DC
  Administered 2023-09-05 – 2023-09-10 (×9): 17 g via ORAL
  Filled 2023-09-05 (×10): qty 1

## 2023-09-05 MED ORDER — MAGNESIUM HYDROXIDE 400 MG/5ML PO SUSP
30.0000 mL | Freq: Once | ORAL | Status: AC
  Administered 2023-09-05: 30 mL via ORAL
  Filled 2023-09-05: qty 30
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
## 2023-09-05 NOTE — Plan of Care (Signed)
 Continue POC

## 2023-09-06 ENCOUNTER — Inpatient Hospital Stay (HOSPITAL_COMMUNITY)

## 2023-09-06 LAB — BASIC METABOLIC PANEL WITH GFR
Anion gap: 8 (ref 5–15)
BUN: <5 mg/dL — ABNORMAL LOW (ref 6–20)
CO2: 28 mmol/L (ref 22–32)
Calcium: 8.4 mg/dL — ABNORMAL LOW (ref 8.9–10.3)
Chloride: 104 mmol/L (ref 98–111)
Creatinine, Ser: 0.65 mg/dL (ref 0.44–1.00)
GFR, Estimated: >60 mL/min (ref >60)
Glucose, Bld: 102 mg/dL — ABNORMAL HIGH (ref 70–99)
Potassium: 3.9 mmol/L (ref 3.5–5.1)
Sodium: 140 mmol/L (ref 135–145)

## 2023-09-06 LAB — CBC
HCT: 33.5 % — ABNORMAL LOW (ref 36.0–46.0)
Hemoglobin: 10.3 g/dL — ABNORMAL LOW (ref 12.0–15.0)
MCH: 27.1 pg (ref 26.0–34.0)
MCHC: 30.7 g/dL (ref 30.0–36.0)
MCV: 88.2 fL (ref 80.0–100.0)
Platelets: 346 10*3/uL (ref 150–400)
RBC: 3.8 MIL/uL — ABNORMAL LOW (ref 3.87–5.11)
RDW: 15.5 % (ref 11.5–15.5)
WBC: 7.4 10*3/uL (ref 4.0–10.5)
nRBC: 0 % (ref 0.0–0.2)

## 2023-09-06 MED ORDER — ENOXAPARIN SODIUM 60 MG/0.6ML IJ SOSY
60.0000 mg | PREFILLED_SYRINGE | Freq: Two times a day (BID) | INTRAMUSCULAR | Status: DC
  Administered 2023-09-06 – 2023-09-10 (×8): 60 mg via SUBCUTANEOUS
  Filled 2023-09-06 (×9): qty 0.6

## 2023-09-06 MED ORDER — HYDROMORPHONE HCL 1 MG/ML IJ SOLN
0.5000 mg | INTRAMUSCULAR | Status: DC | PRN
  Administered 2023-09-06 – 2023-09-10 (×11): 0.5 mg via INTRAVENOUS
  Filled 2023-09-06 (×14): qty 0.5

## 2023-09-06 MED ORDER — OXYCODONE HCL 5 MG PO TABS
10.0000 mg | ORAL_TABLET | ORAL | Status: DC | PRN
  Administered 2023-09-06 – 2023-09-10 (×16): 15 mg via ORAL
  Filled 2023-09-06 (×16): qty 3

## 2023-09-06 MED ORDER — IOHEXOL 350 MG/ML SOLN
85.0000 mL | Freq: Once | INTRAVENOUS | Status: AC | PRN
  Administered 2023-09-06: 85 mL via INTRAVENOUS

## 2023-09-06 MED ORDER — IOHEXOL 350 MG/ML SOLN: 75.0000 mL | Freq: Once | INTRAVENOUS | Status: DC | PRN

## 2023-09-06 NOTE — Anesthesia Postprocedure Evaluation (Signed)
 Anesthesia Post Note  Patient: Scientist, clinical (histocompatibility and immunogenetics)  Procedure(s) Performed: IRRIGATION AND DEBRIDEMENT WOUND (Left) APPLICATION, WOUND VAC (Left: Hand)     Patient location during evaluation: PACU Anesthesia Type: General Level of consciousness: awake and alert Pain management: pain level controlled Vital Signs Assessment: post-procedure vital signs reviewed and stable Respiratory status: spontaneous breathing, nonlabored ventilation and respiratory function stable Cardiovascular status: blood pressure returned to baseline and stable Postop Assessment: no apparent nausea or vomiting Anesthetic complications: no   No notable events documented.                Sekai Gitlin

## 2023-09-06 NOTE — Progress Notes (Signed)
 Pt off unit for CT scan.

## 2023-09-06 NOTE — Progress Notes (Signed)
 Pt is very much concerned with her HR elevating each time she gets out of to use the bathroom. According to pt, she is concerned due to her history of blood clot. Pt is requesting to have CT to rule out PE/clot.
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
## 2023-09-06 NOTE — Plan of Care (Signed)
  Problem: Education: Goal: Knowledge of General Education information will improve Description: Including pain rating scale, medication(s)/side effects and non-pharmacologic comfort measures 09/06/2023 0505 by Alejos Husband, RN Outcome: Progressing 09/05/2023 2319 by Alejos Husband, RN Outcome: Progressing   Problem: Health Behavior/Discharge Planning: Goal: Ability to manage health-related needs will improve 09/06/2023 0505 by Alejos Husband, RN Outcome: Progressing 09/05/2023 2319 by Alejos Husband, RN Outcome: Progressing   Problem: Clinical Measurements: Goal: Ability to maintain clinical measurements within normal limits will improve 09/06/2023 0505 by Alejos Husband, RN Outcome: Progressing 09/05/2023 2319 by Alejos Husband, RN Outcome: Progressing Goal: Will remain free from infection 09/06/2023 0505 by Alejos Husband, RN Outcome: Progressing 09/05/2023 2319 by Alejos Husband, RN Outcome: Progressing Goal: Diagnostic test results will improve 09/06/2023 0505 by Alejos Husband, RN Outcome: Progressing 09/05/2023 2319 by Alejos Husband, RN Outcome: Progressing Goal: Respiratory complications will improve 09/06/2023 0505 by Alejos Husband, RN Outcome: Progressing 09/05/2023 2319 by Alejos Husband, RN Outcome: Progressing Goal: Cardiovascular complication will be avoided 09/06/2023 0505 by Alejos Husband, RN Outcome: Progressing 09/05/2023 2319 by Alejos Husband, RN Outcome: Progressing   Problem: Activity: Goal: Risk for activity intolerance will decrease 09/06/2023 0505 by Alejos Husband, RN Outcome: Progressing 09/05/2023 2319 by Alejos Husband, RN Outcome: Progressing   Problem: Nutrition: Goal: Adequate nutrition will be maintained 09/06/2023 0505 by Alejos Husband, RN Outcome: Progressing 09/05/2023 2319 by Alejos Husband, RN Outcome: Progressing   Problem: Coping: Goal: Level of anxiety will  decrease 09/06/2023 0505 by Alejos Husband, RN Outcome: Progressing 09/05/2023 2319 by Alejos Husband, RN Outcome: Progressing   Problem: Elimination: Goal: Will not experience complications related to bowel motility 09/06/2023 0505 by Alejos Husband, RN Outcome: Progressing 09/05/2023 2319 by Alejos Husband, RN Outcome: Progressing Goal: Will not experience complications related to urinary retention 09/06/2023 0505 by Alejos Husband, RN Outcome: Progressing 09/05/2023 2319 by Alejos Husband, RN Outcome: Progressing   Problem: Pain Managment: Goal: General experience of comfort will improve and/or be controlled 09/06/2023 0505 by Alejos Husband, RN Outcome: Progressing 09/05/2023 2319 by Alejos Husband, RN Outcome: Progressing   Problem: Safety: Goal: Ability to remain free from injury will improve 09/06/2023 0505 by Alejos Husband, RN Outcome: Progressing 09/05/2023 2319 by Alejos Husband, RN Outcome: Progressing   Problem: Skin Integrity: Goal: Risk for impaired skin integrity will decrease 09/06/2023 0505 by Alejos Husband, RN Outcome: Progressing 09/05/2023 2319 by Alejos Husband, RN Outcome: Progressing

## 2023-09-06 NOTE — Plan of Care (Signed)
  Problem: Education: Goal: Knowledge of General Education information will improve Description: Including pain rating scale, medication(s)/side effects and non-pharmacologic comfort measures Outcome: Progressing   Problem: Activity: Goal: Risk for activity intolerance will decrease Outcome: Progressing   Problem: Elimination: Goal: Will not experience complications related to urinary retention Outcome: Progressing   Problem: Safety: Goal: Ability to remain free from injury will improve Outcome: Progressing   Problem: Coping: Goal: Level of anxiety will decrease Outcome: Not Progressing

## 2023-09-06 NOTE — Progress Notes (Signed)
Pt return to unit from CT scan

## 2023-09-07 ENCOUNTER — Encounter (HOSPITAL_COMMUNITY): Payer: Self-pay | Admitting: Orthopedic Surgery

## 2023-09-07 MED ORDER — LIDOCAINE 5 % EX PTCH
3.0000 | MEDICATED_PATCH | CUTANEOUS | Status: DC
  Administered 2023-09-08 – 2023-09-10 (×3): 3 via TRANSDERMAL
  Filled 2023-09-07 (×3): qty 3

## 2023-09-07 MED ORDER — GABAPENTIN 300 MG PO CAPS
900.0000 mg | ORAL_CAPSULE | Freq: Three times a day (TID) | ORAL | Status: DC
  Administered 2023-09-07 – 2023-09-10 (×8): 900 mg via ORAL
  Filled 2023-09-07 (×9): qty 3

## 2023-09-07 MED ORDER — METHOCARBAMOL 500 MG PO TABS
1000.0000 mg | ORAL_TABLET | Freq: Four times a day (QID) | ORAL | Status: DC
  Administered 2023-09-07 – 2023-09-10 (×11): 1000 mg via ORAL
  Filled 2023-09-07 (×11): qty 2

## 2023-09-07 MED ORDER — TRAMADOL HCL 50 MG PO TABS
50.0000 mg | ORAL_TABLET | Freq: Four times a day (QID) | ORAL | Status: DC
  Administered 2023-09-07 – 2023-09-10 (×12): 50 mg via ORAL
  Filled 2023-09-07 (×12): qty 1
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
## 2023-09-07 NOTE — TOC Progression Note (Signed)
 Transition of Care [facility]) - Progression Note    Patient Details  Name: Heather Cherry MRN: [id number] Date of Birth: [birth year]  Transition of Care [facility] CM/SW Contact  Murry Diaz E Windy Dudek, LCSW Phone Number: 09/07/2023, 9:17 AM  Clinical Narrative:    TOC continues to follow.   Expected Discharge Plan: OP Rehab Barriers to Discharge: Continued Medical Work up  Expected Discharge Plan and Services   Discharge Planning Services: CM Consult   Living arrangements for the past 2 months: Single Family Home                                       Social Determinants of Health (SDOH) Interventions SDOH Screenings   Food Insecurity: No Food Insecurity (08/31/2023)  Housing: Low Risk  (08/31/2023)  Transportation Needs: No Transportation Needs (08/31/2023)  Utilities: Not At Risk (08/31/2023)  Social Connections: Unknown (08/31/2023)  Tobacco Use: Low Risk  (09/04/2023)    Readmission Risk Interventions     No data to display
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
## 2023-09-08 DIAGNOSIS — S61409A Unspecified open wound of unspecified hand, initial encounter: Secondary | ICD-10-CM | POA: Diagnosis not present

## 2023-09-08 MED ORDER — PRAZOSIN HCL 1 MG PO CAPS
1.0000 mg | ORAL_CAPSULE | Freq: Every day | ORAL | Status: DC
  Administered 2023-09-08 – 2023-09-09 (×2): 1 mg via ORAL
  Filled 2023-09-08 (×3): qty 1
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
## 2023-09-08 NOTE — Plan of Care (Signed)
  Problem: Education: Goal: Knowledge of General Education information will improve Description: Including pain rating scale, medication(s)/side effects and non-pharmacologic comfort measures 09/08/2023 0411 by Alejos Husband, RN Outcome: Progressing 09/07/2023 2053 by Alejos Husband, RN Outcome: Progressing   Problem: Health Behavior/Discharge Planning: Goal: Ability to manage health-related needs will improve 09/08/2023 0411 by Alejos Husband, RN Outcome: Progressing 09/07/2023 2053 by Alejos Husband, RN Outcome: Progressing   Problem: Clinical Measurements: Goal: Ability to maintain clinical measurements within normal limits will improve 09/08/2023 0411 by Alejos Husband, RN Outcome: Progressing 09/07/2023 2053 by Alejos Husband, RN Outcome: Progressing Goal: Will remain free from infection 09/08/2023 0411 by Alejos Husband, RN Outcome: Progressing 09/07/2023 2053 by Alejos Husband, RN Outcome: Progressing Goal: Diagnostic test results will improve 09/08/2023 0411 by Alejos Husband, RN Outcome: Progressing 09/07/2023 2053 by Alejos Husband, RN Outcome: Progressing Goal: Respiratory complications will improve 09/08/2023 0411 by Alejos Husband, RN Outcome: Progressing 09/07/2023 2053 by Alejos Husband, RN Outcome: Progressing Goal: Cardiovascular complication will be avoided 09/08/2023 0411 by Alejos Husband, RN Outcome: Progressing 09/07/2023 2053 by Alejos Husband, RN Outcome: Progressing   Problem: Activity: Goal: Risk for activity intolerance will decrease 09/08/2023 0411 by Alejos Husband, RN Outcome: Progressing 09/07/2023 2053 by Alejos Husband, RN Outcome: Progressing   Problem: Nutrition: Goal: Adequate nutrition will be maintained 09/08/2023 0411 by Alejos Husband, RN Outcome: Progressing 09/07/2023 2053 by Alejos Husband, RN Outcome: Progressing   Problem: Coping: Goal: Level of anxiety will  decrease 09/08/2023 0411 by Alejos Husband, RN Outcome: Progressing 09/07/2023 2053 by Alejos Husband, RN Outcome: Progressing   Problem: Elimination: Goal: Will not experience complications related to bowel motility 09/08/2023 0411 by Alejos Husband, RN Outcome: Progressing 09/07/2023 2053 by Alejos Husband, RN Outcome: Progressing Goal: Will not experience complications related to urinary retention 09/08/2023 0411 by Alejos Husband, RN Outcome: Progressing 09/07/2023 2053 by Alejos Husband, RN Outcome: Progressing   Problem: Pain Managment: Goal: General experience of comfort will improve and/or be controlled 09/08/2023 0411 by Alejos Husband, RN Outcome: Progressing 09/07/2023 2053 by Alejos Husband, RN Outcome: Progressing   Problem: Safety: Goal: Ability to remain free from injury will improve 09/08/2023 0411 by Alejos Husband, RN Outcome: Progressing 09/07/2023 2053 by Alejos Husband, RN Outcome: Progressing   Problem: Skin Integrity: Goal: Risk for impaired skin integrity will decrease 09/08/2023 0411 by Alejos Husband, RN Outcome: Progressing 09/07/2023 2053 by Alejos Husband, RN Outcome: Progressing
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
## 2023-09-09 ENCOUNTER — Encounter (HOSPITAL_COMMUNITY): Payer: Self-pay

## 2023-09-09 ENCOUNTER — Inpatient Hospital Stay (HOSPITAL_COMMUNITY): Admitting: Certified Registered"

## 2023-09-09 ENCOUNTER — Encounter (HOSPITAL_COMMUNITY): Admission: EM | Disposition: A | Discharge status: Home / Self Care | Payer: Self-pay | Source: Home / Self Care

## 2023-09-09 ENCOUNTER — Other Ambulatory Visit: Payer: Self-pay

## 2023-09-09 DIAGNOSIS — S61409A Unspecified open wound of unspecified hand, initial encounter: Secondary | ICD-10-CM | POA: Diagnosis not present

## 2023-09-09 DIAGNOSIS — S6992XA Unspecified injury of left wrist, hand and finger(s), initial encounter: Secondary | ICD-10-CM | POA: Diagnosis not present

## 2023-09-09 DIAGNOSIS — Z6841 Body Mass Index (BMI) 40.0 and over, adult: Secondary | ICD-10-CM | POA: Diagnosis not present

## 2023-09-09 HISTORY — PX: APPLICATION OF WOUND VAC: SHX5189

## 2023-09-09 HISTORY — PX: INCISION AND DRAINAGE OF WOUND: SHX1803

## 2023-09-09 LAB — CBC
HCT: 30.9 % — ABNORMAL LOW (ref 36.0–46.0)
Hemoglobin: 9.6 g/dL — ABNORMAL LOW (ref 12.0–15.0)
MCH: 27.2 pg (ref 26.0–34.0)
MCHC: 31.1 g/dL (ref 30.0–36.0)
MCV: 87.5 fL (ref 80.0–100.0)
Platelets: 303 10*3/uL (ref 150–400)
RBC: 3.53 MIL/uL — ABNORMAL LOW (ref 3.87–5.11)
RDW: 15.9 % — ABNORMAL HIGH (ref 11.5–15.5)
WBC: 7.1 10*3/uL (ref 4.0–10.5)
nRBC: 0 % (ref 0.0–0.2)

## 2023-09-09 LAB — SURGICAL PCR SCREEN
MRSA, PCR: NEGATIVE
Staphylococcus aureus: NEGATIVE

## 2023-09-09 SURGERY — IRRIGATION AND DEBRIDEMENT WOUND
Anesthesia: General | Laterality: Left

## 2023-09-09 MED ORDER — FENTANYL CITRATE (PF) 250 MCG/5ML IJ SOLN
INTRAMUSCULAR | Status: AC
Start: 2023-09-09 — End: ?
  Filled 2023-09-09: qty 5

## 2023-09-09 MED ORDER — DEXMEDETOMIDINE HCL IN NACL 80 MCG/20ML IV SOLN
INTRAVENOUS | Status: DC | PRN
Start: 2023-09-09 — End: 2023-09-09
  Administered 2023-09-09: 8 ug via INTRAVENOUS

## 2023-09-09 MED ORDER — SODIUM CHLORIDE 0.9 % IR SOLN
Status: DC | PRN
Start: 2023-09-09 — End: 2023-09-09
  Administered 2023-09-09: 1000 mL

## 2023-09-09 MED ORDER — ONDANSETRON HCL 4 MG/2ML IJ SOLN
4.0000 mg | Freq: Once | INTRAMUSCULAR | Status: DC | PRN
Start: 2023-09-09 — End: 2023-09-09

## 2023-09-09 MED ORDER — OXYCODONE HCL 5 MG/5ML PO SOLN: 5.0000 mg | Freq: Once | ORAL | Status: DC | PRN

## 2023-09-09 MED ORDER — ONDANSETRON HCL 4 MG/2ML IJ SOLN
INTRAMUSCULAR | Status: DC | PRN
  Administered 2023-09-09: 4 mg via INTRAVENOUS

## 2023-09-09 MED ORDER — HYDROMORPHONE HCL 1 MG/ML IJ SOLN
INTRAMUSCULAR | Status: AC
  Filled 2023-09-09: qty 1

## 2023-09-09 MED ORDER — MIDAZOLAM HCL 2 MG/2ML IJ SOLN
INTRAMUSCULAR | Status: DC | PRN
  Administered 2023-09-09: 2 mg via INTRAVENOUS

## 2023-09-09 MED ORDER — HYDROMORPHONE HCL 1 MG/ML IJ SOLN
0.2500 mg | INTRAMUSCULAR | Status: DC | PRN
  Administered 2023-09-09 (×4): 0.5 mg via INTRAVENOUS

## 2023-09-09 MED ORDER — DEXAMETHASONE SODIUM PHOSPHATE 10 MG/ML IJ SOLN
INTRAMUSCULAR | Status: DC | PRN
  Administered 2023-09-09: 10 mg via INTRAVENOUS

## 2023-09-09 MED ORDER — CHLORHEXIDINE GLUCONATE 0.12 % MT SOLN: 15.0000 mL | Freq: Once | OROMUCOSAL | Status: AC

## 2023-09-09 MED ORDER — POVIDONE-IODINE 10 % EX SWAB
2.0000 | Freq: Once | CUTANEOUS | Status: AC
  Administered 2023-09-09: 2 via TOPICAL

## 2023-09-09 MED ORDER — PROPOFOL 10 MG/ML IV BOLUS
INTRAVENOUS | Status: DC | PRN
  Administered 2023-09-09: 50 mg via INTRAVENOUS
  Administered 2023-09-09: 200 mg via INTRAVENOUS

## 2023-09-09 MED ORDER — VASHE WOUND IRRIGATION OPTIME
TOPICAL | Status: DC | PRN
  Administered 2023-09-09: 34 [oz_av]

## 2023-09-09 MED ORDER — LIDOCAINE 2% (20 MG/ML) 5 ML SYRINGE
INTRAMUSCULAR | Status: DC | PRN
  Administered 2023-09-09: 40 mg via INTRAVENOUS

## 2023-09-09 MED ORDER — PROPOFOL 10 MG/ML IV BOLUS
INTRAVENOUS | Status: AC
  Filled 2023-09-09: qty 20

## 2023-09-09 MED ORDER — MIDAZOLAM HCL 2 MG/2ML IJ SOLN
INTRAMUSCULAR | Status: AC
  Filled 2023-09-09: qty 2

## 2023-09-09 MED ORDER — BUPIVACAINE HCL (PF) 0.25 % IJ SOLN
INTRAMUSCULAR | Status: AC
  Filled 2023-09-09: qty 30

## 2023-09-09 MED ORDER — PHENYLEPHRINE 80 MCG/ML (10ML) SYRINGE FOR IV PUSH (FOR BLOOD PRESSURE SUPPORT)
PREFILLED_SYRINGE | INTRAVENOUS | Status: DC | PRN
  Administered 2023-09-09: 80 ug via INTRAVENOUS

## 2023-09-09 MED ORDER — 0.9 % SODIUM CHLORIDE (POUR BTL) OPTIME
TOPICAL | Status: DC | PRN
  Administered 2023-09-09: 1000 mL

## 2023-09-09 MED ORDER — FENTANYL CITRATE (PF) 250 MCG/5ML IJ SOLN
INTRAMUSCULAR | Status: DC | PRN
  Administered 2023-09-09: 50 ug via INTRAVENOUS
  Administered 2023-09-09: 100 ug via INTRAVENOUS
  Administered 2023-09-09 (×2): 50 ug via INTRAVENOUS

## 2023-09-09 MED ORDER — DEXAMETHASONE SODIUM PHOSPHATE 10 MG/ML IJ SOLN
INTRAMUSCULAR | Status: AC
  Filled 2023-09-09: qty 1

## 2023-09-09 MED ORDER — OXYCODONE HCL 5 MG PO TABS: 5.0000 mg | ORAL_TABLET | Freq: Once | ORAL | Status: DC | PRN

## 2023-09-09 MED ORDER — LACTATED RINGERS IV SOLN: INTRAVENOUS | Status: DC

## 2023-09-09 MED ORDER — CEFAZOLIN SODIUM 1 G IJ SOLR
INTRAMUSCULAR | Status: AC
  Filled 2023-09-09: qty 10

## 2023-09-09 MED ORDER — DEXMEDETOMIDINE HCL IN NACL 80 MCG/20ML IV SOLN
INTRAVENOUS | Status: AC
  Filled 2023-09-09: qty 20

## 2023-09-09 MED ORDER — ONDANSETRON HCL 4 MG/2ML IJ SOLN
INTRAMUSCULAR | Status: AC
  Filled 2023-09-09: qty 2

## 2023-09-09 MED ORDER — CHLORHEXIDINE GLUCONATE 4 % EX SOLN: 60.0000 mL | Freq: Once | CUTANEOUS | Status: DC

## 2023-09-09 MED ORDER — ORAL CARE MOUTH RINSE: 15.0000 mL | Freq: Once | OROMUCOSAL | Status: AC

## 2023-09-09 MED ORDER — CEFAZOLIN SODIUM-DEXTROSE 3-4 GM/150ML-% IV SOLN
3.0000 g | INTRAVENOUS | Status: AC
  Administered 2023-09-09: 3 g via INTRAVENOUS

## 2023-09-09 MED ORDER — DROPERIDOL 2.5 MG/ML IJ SOLN: 0.6250 mg | Freq: Once | INTRAMUSCULAR | Status: DC | PRN

## 2023-09-09 MED ORDER — CHLORHEXIDINE GLUCONATE 0.12 % MT SOLN
OROMUCOSAL | Status: AC
  Administered 2023-09-09: 15 mL via OROMUCOSAL
  Filled 2023-09-09: qty 15

## 2023-09-09 SURGICAL SUPPLY — 56 items
BAG COUNTER SPONGE SURGICOUNT (BAG) ×1 IMPLANT
BLADE AVERAGE 25X9 (BLADE) IMPLANT
BNDG COHESIVE 1X5 TAN STRL LF (GAUZE/BANDAGES/DRESSINGS) IMPLANT
BNDG COHESIVE 6X5 TAN NS LF (GAUZE/BANDAGES/DRESSINGS) IMPLANT
BNDG COMPR ESMARK 4X3 LF (GAUZE/BANDAGES/DRESSINGS) ×1 IMPLANT
BNDG ELASTIC 3INX 5YD STR LF (GAUZE/BANDAGES/DRESSINGS) ×1 IMPLANT
BNDG ELASTIC 4X5.8 VLCR STR LF (GAUZE/BANDAGES/DRESSINGS) ×1 IMPLANT
BNDG GAUZE DERMACEA FLUFF 4 (GAUZE/BANDAGES/DRESSINGS) ×1 IMPLANT
CLEANSER WND VASHE 34 (WOUND CARE) IMPLANT
CORD BIPOLAR FORCEPS 12FT (ELECTRODE) ×1 IMPLANT
COVER SURGICAL LIGHT HANDLE (MISCELLANEOUS) ×1 IMPLANT
CUFF TOURN SGL QUICK 18X4 (TOURNIQUET CUFF) ×1 IMPLANT
CUFF TRNQT CYL 24X4X16.5-23 (TOURNIQUET CUFF) IMPLANT
DRAIN PENROSE 12X.25 LTX STRL (MISCELLANEOUS) IMPLANT
DRAPE DERMATAC (DRAPES) IMPLANT
DRAPE SURG 17X23 STRL (DRAPES) ×1 IMPLANT
DRESSING VERAFLO CLEANS CC MED (GAUZE/BANDAGES/DRESSINGS) IMPLANT
DRSG ADAPTIC 3X8 NADH LF (GAUZE/BANDAGES/DRESSINGS) ×1 IMPLANT
ELECTRODE REM PT RTRN 9FT ADLT (ELECTROSURGICAL) IMPLANT
GAUZE SPONGE 4X4 12PLY STRL (GAUZE/BANDAGES/DRESSINGS) ×1 IMPLANT
GAUZE STRETCH 2X75IN STRL (MISCELLANEOUS) IMPLANT
GAUZE XEROFORM 1X8 LF (GAUZE/BANDAGES/DRESSINGS) ×1 IMPLANT
GAUZE XEROFORM 5X9 LF (GAUZE/BANDAGES/DRESSINGS) IMPLANT
GLOVE BIO SURGEON STRL SZ7.5 (GLOVE) ×1 IMPLANT
GLOVE BIOGEL PI IND STRL 7.5 (GLOVE) ×1 IMPLANT
GOWN STRL REUS W/ TWL LRG LVL3 (GOWN DISPOSABLE) ×3 IMPLANT
GOWN STRL REUS W/ TWL XL LVL3 (GOWN DISPOSABLE) ×1 IMPLANT
GRAFT SKIN WND SURGICLOSE M95 (Tissue) IMPLANT
KIT BASIN OR (CUSTOM PROCEDURE TRAY) ×1 IMPLANT
KIT DRSG PREVENA PLUS 7DAY 125 (MISCELLANEOUS) IMPLANT
KIT TURNOVER KIT B (KITS) ×1 IMPLANT
MANIFOLD NEPTUNE II (INSTRUMENTS) ×1 IMPLANT
NDL HYPO 25GX1X1/2 BEV (NEEDLE) IMPLANT
NEEDLE HYPO 25GX1X1/2 BEV (NEEDLE) IMPLANT
NS IRRIG 1000ML POUR BTL (IV SOLUTION) ×1 IMPLANT
PACK ORTHO EXTREMITY (CUSTOM PROCEDURE TRAY) ×1 IMPLANT
PAD ARMBOARD POSITIONER FOAM (MISCELLANEOUS) ×2 IMPLANT
PAD CAST 4YDX4 CTTN HI CHSV (CAST SUPPLIES) ×1 IMPLANT
SET CYSTO W/LG BORE CLAMP LF (SET/KITS/TRAYS/PACK) IMPLANT
SET HNDPC FAN SPRY TIP SCT (DISPOSABLE) IMPLANT
SOAP 2 % CHG 4 OZ (WOUND CARE) ×1 IMPLANT
SPONGE T-LAP 18X18 ~~LOC~~+RFID (SPONGE) ×1 IMPLANT
SPONGE T-LAP 4X18 ~~LOC~~+RFID (SPONGE) ×1 IMPLANT
SUT ETHILON 3 0 PS 1 (SUTURE) IMPLANT
SUT ETHILON 4 0 PS 2 18 (SUTURE) IMPLANT
SUT NYLON ETHILON 5-0 P-3 1X18 (SUTURE) IMPLANT
SUT VIC AB 3-0 PS2 18XBRD (SUTURE) IMPLANT
SWAB COLLECTION DEVICE MRSA (MISCELLANEOUS) ×1 IMPLANT
SWAB CULTURE ESWAB REG 1ML (MISCELLANEOUS) IMPLANT
SYR CONTROL 10ML LL (SYRINGE) IMPLANT
TOWEL GREEN STERILE (TOWEL DISPOSABLE) ×1 IMPLANT
TOWEL GREEN STERILE FF (TOWEL DISPOSABLE) ×1 IMPLANT
TUBE CONNECTING 12X1/4 (SUCTIONS) ×1 IMPLANT
UNDERPAD 30X36 HEAVY ABSORB (UNDERPADS AND DIAPERS) ×1 IMPLANT
WATER STERILE IRR 1000ML POUR (IV SOLUTION) ×1 IMPLANT
YANKAUER SUCT BULB TIP NO VENT (SUCTIONS) ×1 IMPLANT
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
## 2023-09-09 NOTE — Anesthesia Procedure Notes (Signed)
 Procedure Name: LMA Insertion Date/Time: 09/09/2023 3:43 PM  Performed by: Linard Reno, CRNAPre-anesthesia Checklist: Patient identified, Emergency Drugs available, Suction available and Patient being monitored Patient Re-evaluated:Patient Re-evaluated prior to induction Oxygen Delivery Method: Circle System Utilized Preoxygenation: Pre-oxygenation with 100% oxygen Induction Type: IV induction Ventilation: Mask ventilation without difficulty LMA: LMA inserted LMA Size: 4.0 Number of attempts: 1 Airway Equipment and Method: Bite block Placement Confirmation: positive ETCO2 Tube secured with: Tape Dental Injury: Teeth and Oropharynx as per pre-operative assessment

## 2023-09-09 NOTE — Plan of Care (Signed)
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
## 2023-09-09 NOTE — TOC Transition Note (Signed)
 Transition of Care [facility]) - Discharge Note   Patient Details  Name: Heather Cherry MRN: [id number] Date of Birth: May 09, 1985  [facility] CM/SW Contact:  Diaz Crago E [name], LCSW Phone Number: 09/09/2023, 2:37 PM   Clinical Narrative:    Patient to DC home today after procedure. RNCM is sending referral for OPOT/ST.   Final next level of care: OP Rehab Barriers to Discharge: Barriers Resolved   Patient Goals and CMS Choice            Discharge Placement                       Discharge Plan and Services Additional resources added to the After Visit Summary for     Discharge Planning Services: CM Consult                                 Social Drivers of Health (SDOH) Interventions SDOH Screenings   Food Insecurity: No Food Insecurity (08/31/2023)  Housing: Low Risk  (08/31/2023)  Transportation Needs: No Transportation Needs (08/31/2023)  Utilities: Not At Risk (08/31/2023)  Social Connections: Unknown (08/31/2023)  Tobacco Use: Low Risk  (09/09/2023)     Readmission Risk Interventions     No data to display
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
## 2023-09-09 NOTE — Discharge Instructions (Signed)
 PTSD Brochure [email or web address]  [facility]:  Address: [address], [place], [place] [address] Hours:  Closes 5?PM Phone: [phone]  [facility] [place]:  Located in: [facility] Address: [address] [place], [place] [address] Areas served:  [employer]:  Closes 8?PM  Phone: [phone]  Trauma Focused Therapist(s):   Clemente Cushing [address] [place], [place] [address]  [phone]  Dain Drown [facility] [place], [place] [address]  [email or web address]  [facility] Clinical Mental Health Counselor, [place], [facility], [facility], [facility] [phone] and [facility] [place], [place] [address] [phone]  [facility] [address] [place], [place] [address]  [phone]   [facility] [address] [place], [place]  [address] [place]  Phone: [phone]  Trauma Support Groups [facility] [address] [place], [place] [address]   [facility] [address] [place]   Phone: [phone]  [facility]  [phone] [address] [address] [place], [place] [address]  [facility] (Emotional Abuse Survivor) Emma Hardy [phone]  The [address] [place], [place] [address]  [phone]  M[facility] Services [facility] [address] [place] [address] [phone]  18 Suicide and Crisis Lifeline 988 [email or web address]  [facility] Website: [email or web address]   24-Hour Call Center: [phone]   Behavioral Health Crisis Line: 2673108392   [facility]  Website: TripDoors.com.cy  Address: [address] [place], [place], [place] [address]  Contact: [phone]  [facility] Website: [email or web address]  Address: [address], [place], [place] [address]  Contact: [phone]
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
## 2023-09-09 NOTE — Anesthesia Preprocedure Evaluation (Addendum)
 Anesthesia Evaluation  Patient identified by MRN, date of birth, ID band Patient awake    Reviewed: Allergy & Precautions, NPO status , Patient's Chart, lab work & pertinent test results  Airway Mallampati: III  TM Distance: >3 FB     Dental no notable dental hx. (+) Teeth Intact, Dental Advisory Given   Pulmonary neg pulmonary ROS   Pulmonary exam normal breath sounds clear to auscultation       Cardiovascular negative cardio ROS Normal cardiovascular exam Rhythm:Regular Rate:Normal     Neuro/Psych negative neurological ROS  negative psych ROS   GI/Hepatic Neg liver ROS,GERD  ,,  Endo/Other    Class 4 obesity  Renal/GU negative Renal ROS  negative genitourinary   Musculoskeletal   Abdominal  (+) + obese  Peds  Hematology   Anesthesia Other Findings   Reproductive/Obstetrics                              Anesthesia Physical Anesthesia Plan  ASA: 3  Anesthesia Plan: General   Post-op Pain Management: Dilaudid  IV and Precedex    Induction: Intravenous  PONV Risk Score and Plan: 3 and Treatment may vary due to age or medical condition and Ondansetron   Airway Management Planned: LMA and Oral ETT  Additional Equipment: None  Intra-op Plan:   Post-operative Plan: Extubation in OR  Informed Consent: I have reviewed the patients History and Physical, chart, labs and discussed the procedure including the risks, benefits and alternatives for the proposed anesthesia with the patient or authorized representative who has indicated his/her understanding and acceptance.     Dental advisory given  Plan Discussed with: Anesthesiologist and CRNA  Anesthesia Plan Comments:          Anesthesia Quick Evaluation

## 2023-09-09 NOTE — Transfer of Care (Signed)
 Immediate Anesthesia Transfer of Care Note  Patient: Heather Cherry  Procedure(s) Performed: IRRIGATION AND DEBRIDEMENT WOUND (Left) APPLICATION, WOUND VAC (Left)  Patient Location: PACU  Anesthesia Type:General  Level of Consciousness: awake, alert , and oriented  Airway & Oxygen Therapy: Patient Spontanous Breathing  Post-op Assessment: Report given to RN and Post -op Vital signs reviewed and stable  Post vital signs: Reviewed and stable  Last Vitals:  Vitals Value Taken Time  BP 138/76 09/09/23 1700  Temp 36.7 C 09/09/23 1657  Pulse 97 09/09/23 1713  Resp 19 09/09/23 1713  SpO2 95 % 09/09/23 1713  Vitals shown include unfiled device data.  Last Pain:  Vitals:   09/09/23 1657  TempSrc:   PainSc: 7       Patients Stated Pain Goal: 2 (09/09/23 1423)  Complications: No notable events documented.

## 2023-09-09 NOTE — Progress Notes (Signed)
 The patient is doing well status post left hand I&D and skin grafting.  We are using a biologic substitute for the skin graft and wound VAC.  She is due for a change today.  Would not perform an I&D possible revision of the metacarpal heads if needed for coverage possible rotational flap coverage versus repeat skin grafting with biologic substitute and wound VAC application.  The risk benefits alternatives of surgery discussed with the patient and she elects to proceed.  Plan for likely discharge shortly following this surgery and we will perform the remainder of the debridement and wound VAC on an outpatient basis.  Informed consent was obtained.  Miller Allis, MD Orthopaedic Hand Surgeon

## 2023-09-09 NOTE — Progress Notes (Signed)
 SLP Cancellation Note  Patient Details Name: Heather Cherry MRN: [id number] DOB: 1985/07/16   Cancelled treatment:       Reason Eval/Treat Not Completed: Patient at procedure or test/unavailable (OR)    Beth Brooke., M.A. CCC-SLP Acute Rehabilitation Services Office: [phone]  Secure chat preferred  09/09/2023, 2:44 PM

## 2023-09-09 NOTE — Plan of Care (Signed)
  Problem: Education: Goal: Knowledge of General Education information will improve Description: Including pain rating scale, medication(s)/side effects and non-pharmacologic comfort measures 09/09/2023 0351 by Alejos Husband, RN Outcome: Progressing 09/08/2023 2050 by Alejos Husband, RN Outcome: Progressing   Problem: Health Behavior/Discharge Planning: Goal: Ability to manage health-related needs will improve 09/09/2023 0351 by Alejos Husband, RN Outcome: Progressing 09/08/2023 2050 by Alejos Husband, RN Outcome: Progressing   Problem: Clinical Measurements: Goal: Ability to maintain clinical measurements within normal limits will improve 09/09/2023 0351 by Alejos Husband, RN Outcome: Progressing 09/08/2023 2050 by Alejos Husband, RN Outcome: Progressing Goal: Will remain free from infection 09/09/2023 0351 by Alejos Husband, RN Outcome: Progressing 09/08/2023 2050 by Alejos Husband, RN Outcome: Progressing Goal: Diagnostic test results will improve 09/09/2023 0351 by Alejos Husband, RN Outcome: Progressing 09/08/2023 2050 by Alejos Husband, RN Outcome: Progressing Goal: Respiratory complications will improve 09/09/2023 0351 by Alejos Husband, RN Outcome: Progressing 09/08/2023 2050 by Alejos Husband, RN Outcome: Progressing Goal: Cardiovascular complication will be avoided 09/09/2023 0351 by Alejos Husband, RN Outcome: Progressing 09/08/2023 2050 by Alejos Husband, RN Outcome: Progressing   Problem: Activity: Goal: Risk for activity intolerance will decrease 09/09/2023 0351 by Alejos Husband, RN Outcome: Progressing 09/08/2023 2050 by Alejos Husband, RN Outcome: Progressing   Problem: Nutrition: Goal: Adequate nutrition will be maintained 09/09/2023 0351 by Alejos Husband, RN Outcome: Progressing 09/08/2023 2050 by Alejos Husband, RN Outcome: Progressing   Problem: Coping: Goal: Level of anxiety will  decrease 09/09/2023 0351 by Alejos Husband, RN Outcome: Progressing 09/08/2023 2050 by Alejos Husband, RN Outcome: Progressing   Problem: Elimination: Goal: Will not experience complications related to bowel motility 09/09/2023 0351 by Alejos Husband, RN Outcome: Progressing 09/08/2023 2050 by Alejos Husband, RN Outcome: Progressing Goal: Will not experience complications related to urinary retention 09/09/2023 0351 by Alejos Husband, RN Outcome: Progressing 09/08/2023 2050 by Alejos Husband, RN Outcome: Progressing   Problem: Pain Managment: Goal: General experience of comfort will improve and/or be controlled 09/09/2023 0351 by Alejos Husband, RN Outcome: Progressing 09/08/2023 2050 by Alejos Husband, RN Outcome: Progressing   Problem: Safety: Goal: Ability to remain free from injury will improve 09/09/2023 0351 by Alejos Husband, RN Outcome: Progressing 09/08/2023 2050 by Alejos Husband, RN Outcome: Progressing   Problem: Skin Integrity: Goal: Risk for impaired skin integrity will decrease 09/09/2023 0351 by Alejos Husband, RN Outcome: Progressing 09/08/2023 2050 by Alejos Husband, RN Outcome: Progressing

## 2023-09-10 ENCOUNTER — Encounter (HOSPITAL_COMMUNITY): Payer: Self-pay | Admitting: Orthopedic Surgery

## 2023-09-10 ENCOUNTER — Telehealth (HOSPITAL_COMMUNITY): Payer: Self-pay

## 2023-09-10 ENCOUNTER — Other Ambulatory Visit (HOSPITAL_COMMUNITY): Payer: Self-pay

## 2023-09-10 MED ORDER — TAB-A-VITE/IRON PO TABS: 1.0000 | ORAL_TABLET | Freq: Every day | ORAL | Status: AC

## 2023-09-10 MED ORDER — HYDROXYZINE HCL 25 MG PO TABS
25.0000 mg | ORAL_TABLET | Freq: Three times a day (TID) | ORAL | 0 refills | Status: AC | PRN
  Filled 2023-09-10: qty 10, 4d supply, fill #0

## 2023-09-10 MED ORDER — METHOCARBAMOL 500 MG PO TABS
1000.0000 mg | ORAL_TABLET | Freq: Four times a day (QID) | ORAL | 0 refills | Status: AC | PRN
  Filled 2023-09-10: qty 60, 8d supply, fill #0

## 2023-09-10 MED ORDER — ACETAMINOPHEN 500 MG PO TABS: 1000.0000 mg | ORAL_TABLET | Freq: Three times a day (TID) | ORAL | Status: DC | PRN

## 2023-09-10 MED ORDER — POLYETHYLENE GLYCOL 3350 17 G PO PACK: 17.0000 g | PACK | Freq: Two times a day (BID) | ORAL | Status: AC | PRN

## 2023-09-10 MED ORDER — PRAZOSIN HCL 1 MG PO CAPS
1.0000 mg | ORAL_CAPSULE | Freq: Every day | ORAL | 0 refills | Status: AC
  Filled 2023-09-10: qty 20, 20d supply, fill #0

## 2023-09-10 MED ORDER — DOCUSATE SODIUM 100 MG PO CAPS: 100.0000 mg | ORAL_CAPSULE | Freq: Two times a day (BID) | ORAL | Status: AC | PRN

## 2023-09-10 MED ORDER — CALCIUM CARBONATE ANTACID 500 MG PO CHEW: 2.0000 | CHEWABLE_TABLET | Freq: Three times a day (TID) | ORAL | Status: AC

## 2023-09-10 MED ORDER — TRAMADOL HCL 50 MG PO TABS
100.0000 mg | ORAL_TABLET | Freq: Four times a day (QID) | ORAL | 0 refills | Status: DC | PRN
  Filled 2023-09-10: qty 20, 5d supply, fill #0

## 2023-09-10 MED ORDER — OXYCODONE HCL 10 MG PO TABS
10.0000 mg | ORAL_TABLET | ORAL | 0 refills | Status: AC | PRN
  Filled 2023-09-10: qty 25, 5d supply, fill #0

## 2023-09-10 MED ORDER — TRAMADOL HCL 50 MG PO TABS
100.0000 mg | ORAL_TABLET | Freq: Four times a day (QID) | ORAL | Status: DC
  Administered 2023-09-10: 100 mg via ORAL
  Filled 2023-09-10: qty 2

## 2023-09-10 MED ORDER — GABAPENTIN 300 MG PO CAPS
900.0000 mg | ORAL_CAPSULE | Freq: Three times a day (TID) | ORAL | 0 refills | Status: AC | PRN
  Filled 2023-09-10: qty 60, 7d supply, fill #0
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
## 2023-09-10 NOTE — Consult Note (Signed)
 WOC Nurse Consult Note: Reason for Consult: Requested to plug the Prevena machine on left wound hand. The dressing was already changed, plug to the hospital VAC machine. I plugged in the Prevena, explained about how the machine will work for 7 days.  Recommended to call to the doctor's office to have more informations about when the dressing will be changed.  The pt is ready to be DC following the doctor's orders.  WOC team will not plan to follow further.  Please reconsult if further assistance is needed. Thank-you,  Rachel Budds BSN, RN, [employer], WOC  (Pager: [phone])

## 2023-09-10 NOTE — TOC Transition Note (Addendum)
 Transition of Care [facility]) - Discharge Note   Patient Details  Name: Bettylou Frew MRN: [id number] Date of Birth: [birth year]  [facility] CM/SW Contact:  Aziyah Provencal E Kalen Ratajczak, LCSW Phone Number: 09/10/2023, 9:06 AM   Clinical Narrative:    Call from [facility] with [employer], she states she was informed by OR staff that patient needs a home vac.  Clarified with Ortho MD that patient will go home with a Prevena - notified Sherrlyn Dolores. Confirmed with PA no HH needs.  OP Rehab referral was sent by [facility] on 5/29.   Final next level of care: OP Rehab Barriers to Discharge: Barriers Resolved   Patient Goals and CMS Choice            Discharge Placement                       Discharge Plan and Services Additional resources added to the After Visit Summary for     Discharge Planning Services: CM Consult                                 Social Drivers of Health (SDOH) Interventions SDOH Screenings   Food Insecurity: No Food Insecurity (08/31/2023)  Housing: Low Risk  (08/31/2023)  Transportation Needs: No Transportation Needs (08/31/2023)  Utilities: Not At Risk (08/31/2023)  Social Connections: Unknown (08/31/2023)  Tobacco Use: Low Risk  (09/09/2023)     Readmission Risk Interventions     No data to display

## 2023-09-10 NOTE — Telephone Encounter (Signed)
 Pharmacy Patient Advocate Encounter   Received notification from Inpatient Request that prior authorization for traMADol HCl 50MG  tablets is required/requested.   Insurance verification completed.   The patient is insured through [employer] [facility] .   Per test claim: PA required; PA started via CoverMyMeds. KEY [id number] . Waiting for clinical questions to populate.

## 2023-09-10 NOTE — Plan of Care (Signed)

## 2023-09-10 NOTE — Progress Notes (Signed)
 Patient taken off tele, IV removed. Medication retrieved from [facility] and given to patient. D/C education completed with significant other and patient and all questions and concerns addressed. Patient changed from wound vac to prevena by WOC RN. Education given on prevena and pt voices understanding denying  any questions. Pt discharge home with significant other to transport her home. Pt offered wheelchair but pt declines and Patient ambulated off unit with belongings and significant other at side. Jenetta Misty, RN
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
## 2023-09-10 NOTE — Telephone Encounter (Signed)
 CLINICAL QUESTIONS ANSWERED, PA CURRENTLY PENDING

## 2023-09-13 ENCOUNTER — Other Ambulatory Visit: Payer: Self-pay

## 2023-09-13 ENCOUNTER — Emergency Department (HOSPITAL_COMMUNITY)
Admission: EM | Admit: 2023-09-13 | Discharge: 2023-09-13 | Disposition: A | Discharge status: Home / Self Care | Source: Home / Self Care | Attending: Emergency Medicine | Admitting: Emergency Medicine

## 2023-09-13 ENCOUNTER — Encounter (HOSPITAL_COMMUNITY): Payer: Self-pay | Admitting: *Deleted

## 2023-09-13 DIAGNOSIS — F32A Depression, unspecified: Secondary | ICD-10-CM | POA: Diagnosis not present

## 2023-09-13 DIAGNOSIS — R4781 Slurred speech: Secondary | ICD-10-CM | POA: Diagnosis present

## 2023-09-13 DIAGNOSIS — I10 Essential (primary) hypertension: Secondary | ICD-10-CM | POA: Diagnosis not present

## 2023-09-13 DIAGNOSIS — Z86718 Personal history of other venous thrombosis and embolism: Secondary | ICD-10-CM | POA: Diagnosis not present

## 2023-09-13 DIAGNOSIS — Z7901 Long term (current) use of anticoagulants: Secondary | ICD-10-CM | POA: Diagnosis not present

## 2023-09-13 DIAGNOSIS — E039 Hypothyroidism, unspecified: Secondary | ICD-10-CM | POA: Diagnosis not present

## 2023-09-13 DIAGNOSIS — Z4689 Encounter for fitting and adjustment of other specified devices: Secondary | ICD-10-CM | POA: Insufficient documentation

## 2023-09-13 DIAGNOSIS — F419 Anxiety disorder, unspecified: Secondary | ICD-10-CM | POA: Diagnosis not present

## 2023-09-13 DIAGNOSIS — R569 Unspecified convulsions: Secondary | ICD-10-CM | POA: Diagnosis not present

## 2023-09-13 DIAGNOSIS — K219 Gastro-esophageal reflux disease without esophagitis: Secondary | ICD-10-CM | POA: Diagnosis not present

## 2023-09-13 DIAGNOSIS — Z79899 Other long term (current) drug therapy: Secondary | ICD-10-CM | POA: Diagnosis not present
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
## 2023-09-13 NOTE — Telephone Encounter (Signed)
 Pharmacy Patient Advocate Encounter  Received notification from [facility] that Prior Authorization for traMADol  HCl 50MG  tablets  has been DENIED.  Full denial letter will be uploaded to the media tab. See denial reason below. We have denied your request because it is for more than the amount your plan covers (quantity limit). We reviewed the information we had. We have partially approved your request for this drug up to the amount your plan covers 8 tablets per day of tramadol  50 mg. Your request for more drug has been denied. Your doctor can send us  any new or missing information for us  to review. For this drug, you may have to meet other criteria. You can request the drug policy for more details. You can also request other plan documents for your review.  PA #/Case ID/Reference #: [id number]
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
## 2023-09-14 NOTE — Anesthesia Postprocedure Evaluation (Signed)
 Anesthesia Post Note  Patient: Heather Cherry  Procedure(s) Performed: IRRIGATION AND DEBRIDEMENT WOUND (Left) APPLICATION, WOUND VAC (Left)     Anesthesia Type: General Anesthetic complications: no Comments: Patient D/C to home prior to post op visit. No apparent anesthetic complications on chart review.     No notable events documented.  Last Vitals:  Vitals:   09/10/23 0947 09/10/23 1220  BP: (!) 144/85 (!) 152/78  Pulse: 96 91  Resp:  18  Temp:  36.7 C  SpO2:  98%    Last Pain:  Vitals:   09/10/23 1220  TempSrc: Oral  PainSc:                  Porshe Fleagle,E. Sharron Petruska

## 2023-09-15 ENCOUNTER — Inpatient Hospital Stay (HOSPITAL_COMMUNITY)

## 2023-09-15 ENCOUNTER — Emergency Department (HOSPITAL_COMMUNITY)

## 2023-09-15 ENCOUNTER — Observation Stay (HOSPITAL_COMMUNITY)
Admission: EM | Admit: 2023-09-15 | Discharge: 2023-09-16 | Disposition: A | Discharge status: Home / Self Care | Attending: Internal Medicine | Admitting: Internal Medicine

## 2023-09-15 DIAGNOSIS — R569 Unspecified convulsions: Secondary | ICD-10-CM | POA: Diagnosis not present

## 2023-09-15 DIAGNOSIS — K219 Gastro-esophageal reflux disease without esophagitis: Secondary | ICD-10-CM | POA: Insufficient documentation

## 2023-09-15 DIAGNOSIS — S61409A Unspecified open wound of unspecified hand, initial encounter: Secondary | ICD-10-CM | POA: Diagnosis present

## 2023-09-15 DIAGNOSIS — Z87820 Personal history of traumatic brain injury: Secondary | ICD-10-CM

## 2023-09-15 DIAGNOSIS — Z86718 Personal history of other venous thrombosis and embolism: Secondary | ICD-10-CM | POA: Insufficient documentation

## 2023-09-15 DIAGNOSIS — G40909 Epilepsy, unspecified, not intractable, without status epilepticus: Secondary | ICD-10-CM | POA: Diagnosis not present

## 2023-09-15 DIAGNOSIS — F32A Depression, unspecified: Secondary | ICD-10-CM | POA: Insufficient documentation

## 2023-09-15 DIAGNOSIS — I10 Essential (primary) hypertension: Secondary | ICD-10-CM | POA: Insufficient documentation

## 2023-09-15 DIAGNOSIS — F419 Anxiety disorder, unspecified: Secondary | ICD-10-CM | POA: Insufficient documentation

## 2023-09-15 DIAGNOSIS — Z86711 Personal history of pulmonary embolism: Secondary | ICD-10-CM | POA: Diagnosis present

## 2023-09-15 DIAGNOSIS — Z7901 Long term (current) use of anticoagulants: Secondary | ICD-10-CM | POA: Insufficient documentation

## 2023-09-15 DIAGNOSIS — E039 Hypothyroidism, unspecified: Secondary | ICD-10-CM | POA: Insufficient documentation

## 2023-09-15 DIAGNOSIS — I2699 Other pulmonary embolism without acute cor pulmonale: Secondary | ICD-10-CM | POA: Diagnosis present

## 2023-09-15 DIAGNOSIS — Z79899 Other long term (current) drug therapy: Secondary | ICD-10-CM | POA: Insufficient documentation

## 2023-09-15 LAB — CBC
HCT: 36.9 % (ref 36.0–46.0)
Hemoglobin: 11.7 g/dL — ABNORMAL LOW (ref 12.0–15.0)
MCH: 27.5 pg (ref 26.0–34.0)
MCHC: 31.7 g/dL (ref 30.0–36.0)
MCV: 86.8 fL (ref 80.0–100.0)
Platelets: 338 10*3/uL (ref 150–400)
RBC: 4.25 MIL/uL (ref 3.87–5.11)
RDW: 15.7 % — ABNORMAL HIGH (ref 11.5–15.5)
WBC: 7.2 10*3/uL (ref 4.0–10.5)
nRBC: 0 % (ref 0.0–0.2)

## 2023-09-15 LAB — I-STAT CHEM 8, ED
BUN: 10 mg/dL (ref 6–20)
Calcium, Ion: 1.09 mmol/L — ABNORMAL LOW (ref 1.15–1.40)
Chloride: 99 mmol/L (ref 98–111)
Creatinine, Ser: 0.8 mg/dL (ref 0.44–1.00)
Glucose, Bld: 188 mg/dL — ABNORMAL HIGH (ref 70–99)
HCT: 36 % (ref 36.0–46.0)
Hemoglobin: 12.2 g/dL (ref 12.0–15.0)
Potassium: 4.5 mmol/L (ref 3.5–5.1)
Sodium: 135 mmol/L (ref 135–145)
TCO2: 26 mmol/L (ref 22–32)

## 2023-09-15 LAB — COMPREHENSIVE METABOLIC PANEL WITH GFR
ALT: 15 U/L (ref 0–44)
AST: 21 U/L (ref 15–41)
Albumin: 3 g/dL — ABNORMAL LOW (ref 3.5–5.0)
Alkaline Phosphatase: 106 U/L (ref 38–126)
Anion gap: 12 (ref 5–15)
BUN: 7 mg/dL (ref 6–20)
CO2: 24 mmol/L (ref 22–32)
Calcium: 9.1 mg/dL (ref 8.9–10.3)
Chloride: 99 mmol/L (ref 98–111)
Creatinine, Ser: 0.81 mg/dL (ref 0.44–1.00)
GFR, Estimated: >60 mL/min (ref >60)
Glucose, Bld: 189 mg/dL — ABNORMAL HIGH (ref 70–99)
Potassium: 4.1 mmol/L (ref 3.5–5.1)
Sodium: 135 mmol/L (ref 135–145)
Total Bilirubin: 1.4 mg/dL — ABNORMAL HIGH (ref 0.0–1.2)
Total Protein: 7.2 g/dL (ref 6.5–8.1)

## 2023-09-15 LAB — DIFFERENTIAL
Abs Immature Granulocytes: 0.03 10*3/uL (ref 0.00–0.07)
Basophils Absolute: 0 10*3/uL (ref 0.0–0.1)
Basophils Relative: 0 %
Eosinophils Absolute: 0 10*3/uL (ref 0.0–0.5)
Eosinophils Relative: 0 %
Immature Granulocytes: 0 %
Lymphocytes Relative: 10 %
Lymphs Abs: 0.7 10*3/uL (ref 0.7–4.0)
Monocytes Absolute: 0.3 10*3/uL (ref 0.1–1.0)
Monocytes Relative: 5 %
Neutro Abs: 6.1 10*3/uL (ref 1.7–7.7)
Neutrophils Relative %: 85 %

## 2023-09-15 LAB — RAPID URINE DRUG SCREEN, HOSP PERFORMED
Amphetamines: NOT DETECTED
Barbiturates: NOT DETECTED
Benzodiazepines: NOT DETECTED
Cocaine: NOT DETECTED
Opiates: POSITIVE — AB
Tetrahydrocannabinol: POSITIVE — AB

## 2023-09-15 LAB — ETHANOL: Alcohol, Ethyl (B): <15 mg/dL (ref <15)

## 2023-09-15 LAB — PROTIME-INR
INR: 1.1 (ref 0.8–1.2)
Prothrombin Time: 14.7 s (ref 11.4–15.2)

## 2023-09-15 LAB — CBG MONITORING, ED: Glucose-Capillary: 93 mg/dL (ref 70–99)

## 2023-09-15 LAB — APTT: aPTT: 35 s (ref 24–36)

## 2023-09-15 MED ORDER — LORAZEPAM 1 MG PO TABS: 1.0000 mg | ORAL_TABLET | Freq: Once | ORAL | Status: DC

## 2023-09-15 MED ORDER — LEVETIRACETAM (KEPPRA) 500 MG/5 ML ADULT IV PUSH
750.0000 mg | Freq: Two times a day (BID) | INTRAVENOUS | Status: DC
  Administered 2023-09-16: 750 mg via INTRAVENOUS
  Filled 2023-09-15: qty 10

## 2023-09-15 MED ORDER — ENOXAPARIN SODIUM 80 MG/0.8ML IJ SOSY
160.0000 mg | PREFILLED_SYRINGE | Freq: Two times a day (BID) | INTRAMUSCULAR | Status: DC
  Administered 2023-09-16: 160 mg via SUBCUTANEOUS
  Filled 2023-09-15 (×3): qty 1.6

## 2023-09-15 MED ORDER — SODIUM CHLORIDE 0.9% FLUSH: 3.0000 mL | Freq: Two times a day (BID) | INTRAVENOUS | Status: DC

## 2023-09-15 MED ORDER — METHIMAZOLE 10 MG PO TABS
30.0000 mg | ORAL_TABLET | Freq: Every day | ORAL | Status: DC
  Administered 2023-09-16: 30 mg via ORAL
  Filled 2023-09-15: qty 3

## 2023-09-15 MED ORDER — LURASIDONE HCL 20 MG PO TABS
20.0000 mg | ORAL_TABLET | Freq: Every day | ORAL | Status: DC
  Administered 2023-09-15: 20 mg via ORAL
  Filled 2023-09-15 (×2): qty 1

## 2023-09-15 MED ORDER — METHOCARBAMOL 1000 MG/10ML IJ SOLN: 500.0000 mg | Freq: Four times a day (QID) | INTRAMUSCULAR | Status: DC | PRN

## 2023-09-15 MED ORDER — OXYCODONE HCL 5 MG PO TABS
10.0000 mg | ORAL_TABLET | Freq: Once | ORAL | Status: AC
  Administered 2023-09-15: 10 mg via ORAL
  Filled 2023-09-15: qty 2

## 2023-09-15 MED ORDER — IOHEXOL 350 MG/ML SOLN
75.0000 mL | Freq: Once | INTRAVENOUS | Status: AC | PRN
  Administered 2023-09-15: 75 mL via INTRAVENOUS

## 2023-09-15 MED ORDER — ACETAMINOPHEN 650 MG RE SUPP: 650.0000 mg | Freq: Four times a day (QID) | RECTAL | Status: DC | PRN

## 2023-09-15 MED ORDER — GADOBUTROL 1 MMOL/ML IV SOLN
10.0000 mL | Freq: Once | INTRAVENOUS | Status: AC | PRN
  Administered 2023-09-15: 10 mL via INTRAVENOUS

## 2023-09-15 MED ORDER — SODIUM CHLORIDE 0.9 % IV SOLN: 250.0000 mL | INTRAVENOUS | Status: DC | PRN

## 2023-09-15 MED ORDER — POLYETHYLENE GLYCOL 3350 17 G PO PACK
17.0000 g | PACK | Freq: Every day | ORAL | Status: DC | PRN
Start: 2023-09-15 — End: 2023-09-16

## 2023-09-15 MED ORDER — DULOXETINE HCL 60 MG PO CPEP
60.0000 mg | ORAL_CAPSULE | Freq: Every day | ORAL | Status: DC
  Administered 2023-09-16: 60 mg via ORAL
  Filled 2023-09-15: qty 2

## 2023-09-15 MED ORDER — ONDANSETRON HCL 4 MG PO TABS: 4.0000 mg | ORAL_TABLET | Freq: Four times a day (QID) | ORAL | Status: DC | PRN

## 2023-09-15 MED ORDER — ONDANSETRON HCL 4 MG/2ML IJ SOLN: 4.0000 mg | Freq: Four times a day (QID) | INTRAMUSCULAR | Status: DC | PRN

## 2023-09-15 MED ORDER — LEVETIRACETAM (KEPPRA) 500 MG/5 ML ADULT IV PUSH
2000.0000 mg | Freq: Once | INTRAVENOUS | Status: AC
  Administered 2023-09-15: 2000 mg via INTRAVENOUS
  Filled 2023-09-15: qty 20

## 2023-09-15 MED ORDER — CALCIUM CARBONATE ANTACID 500 MG PO CHEW
2.0000 | CHEWABLE_TABLET | Freq: Three times a day (TID) | ORAL | Status: DC
  Administered 2023-09-16: 400 mg via ORAL
  Filled 2023-09-15: qty 2

## 2023-09-15 MED ORDER — PANTOPRAZOLE SODIUM 40 MG PO TBEC
40.0000 mg | DELAYED_RELEASE_TABLET | Freq: Every day | ORAL | Status: DC
  Administered 2023-09-16: 40 mg via ORAL
  Filled 2023-09-15: qty 1

## 2023-09-15 MED ORDER — HYDROMORPHONE HCL 1 MG/ML IJ SOLN
0.5000 mg | INTRAMUSCULAR | Status: DC | PRN
  Administered 2023-09-15 – 2023-09-16 (×4): 0.5 mg via INTRAVENOUS
  Filled 2023-09-15 (×4): qty 1

## 2023-09-15 MED ORDER — SODIUM CHLORIDE 0.9% FLUSH
3.0000 mL | INTRAVENOUS | Status: DC | PRN
Start: 2023-09-15 — End: 2023-09-16

## 2023-09-15 MED ORDER — OXYCODONE HCL 5 MG PO TABS
5.0000 mg | ORAL_TABLET | ORAL | Status: DC | PRN
  Administered 2023-09-16 (×2): 5 mg via ORAL
  Filled 2023-09-15 (×2): qty 1

## 2023-09-15 MED ORDER — VALACYCLOVIR HCL 500 MG PO TABS
500.0000 mg | ORAL_TABLET | Freq: Every day | ORAL | Status: DC
  Administered 2023-09-16: 500 mg via ORAL
  Filled 2023-09-15: qty 1

## 2023-09-15 MED ORDER — METOPROLOL SUCCINATE ER 25 MG PO TB24
100.0000 mg | ORAL_TABLET | Freq: Every day | ORAL | Status: DC
  Administered 2023-09-16: 100 mg via ORAL
  Filled 2023-09-15: qty 1

## 2023-09-15 MED ORDER — ACETAMINOPHEN 325 MG PO TABS
650.0000 mg | ORAL_TABLET | Freq: Four times a day (QID) | ORAL | Status: DC | PRN
Start: 2023-09-15 — End: 2023-09-16
  Administered 2023-09-16 (×2): 650 mg via ORAL
  Filled 2023-09-15 (×2): qty 2

## 2023-09-15 MED ORDER — ALBUTEROL SULFATE (2.5 MG/3ML) 0.083% IN NEBU: 2.5000 mg | INHALATION_SOLUTION | RESPIRATORY_TRACT | Status: DC | PRN

## 2023-09-15 NOTE — ED Notes (Signed)
 Patient returned from MRI.
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
## 2023-09-15 NOTE — Progress Notes (Signed)
 Orthopedic Tech Progress Note Patient Details:  Heather Cherry 1986-01-31 [id number] RN requested a carter pillow for this patient. Carter pillow and instructions on how to use the device were provided. Patient ID: Heather Cherry, female   DOB: 1986/01/15, 38 y.o.   MRN: [id number]  Phill Brazil 09/15/2023, 5:36 PM
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
## 2023-09-16 ENCOUNTER — Other Ambulatory Visit (HOSPITAL_COMMUNITY): Payer: Self-pay

## 2023-09-16 ENCOUNTER — Other Ambulatory Visit: Payer: Self-pay | Admitting: Neurology

## 2023-09-16 ENCOUNTER — Inpatient Hospital Stay (HOSPITAL_COMMUNITY)

## 2023-09-16 DIAGNOSIS — R569 Unspecified convulsions: Secondary | ICD-10-CM

## 2023-09-16 DIAGNOSIS — R404 Transient alteration of awareness: Secondary | ICD-10-CM

## 2023-09-16 LAB — COMPREHENSIVE METABOLIC PANEL WITH GFR
ALT: 13 U/L (ref 0–44)
AST: 22 U/L (ref 15–41)
Albumin: 2.7 g/dL — ABNORMAL LOW (ref 3.5–5.0)
Alkaline Phosphatase: 102 U/L (ref 38–126)
Anion gap: 7 (ref 5–15)
BUN: 7 mg/dL (ref 6–20)
CO2: 27 mmol/L (ref 22–32)
Calcium: 8.8 mg/dL — ABNORMAL LOW (ref 8.9–10.3)
Chloride: 103 mmol/L (ref 98–111)
Creatinine, Ser: 0.67 mg/dL (ref 0.44–1.00)
GFR, Estimated: >60 mL/min (ref >60)
Glucose, Bld: 100 mg/dL — ABNORMAL HIGH (ref 70–99)
Potassium: 3.7 mmol/L (ref 3.5–5.1)
Sodium: 137 mmol/L (ref 135–145)
Total Bilirubin: 1 mg/dL (ref 0.0–1.2)
Total Protein: 6.5 g/dL (ref 6.5–8.1)

## 2023-09-16 LAB — CBC
HCT: 35.6 % — ABNORMAL LOW (ref 36.0–46.0)
Hemoglobin: 11 g/dL — ABNORMAL LOW (ref 12.0–15.0)
MCH: 27.4 pg (ref 26.0–34.0)
MCHC: 30.9 g/dL (ref 30.0–36.0)
MCV: 88.6 fL (ref 80.0–100.0)
Platelets: 308 10*3/uL (ref 150–400)
RBC: 4.02 MIL/uL (ref 3.87–5.11)
RDW: 15.5 % (ref 11.5–15.5)
WBC: 6 10*3/uL (ref 4.0–10.5)
nRBC: 0 % (ref 0.0–0.2)

## 2023-09-16 MED ORDER — VALTOCO 20 MG DOSE 2 X 10 MG/0.1ML NA LQPK
20.0000 mg | Freq: Once | NASAL | 0 refills | Status: DC | PRN
  Filled 2023-09-16: qty 2, 30d supply, fill #0

## 2023-09-16 MED ORDER — LEVETIRACETAM 750 MG PO TABS
750.0000 mg | ORAL_TABLET | Freq: Two times a day (BID) | ORAL | 3 refills | Status: AC
  Filled 2023-09-16: qty 60, 30d supply, fill #0

## 2023-09-16 MED ORDER — CLONAZEPAM 1 MG PO TABS
10.0000 mg | ORAL_TABLET | Freq: Once | ORAL | 0 refills | Status: DC | PRN
  Filled 2023-09-16: qty 10, 2d supply, fill #0

## 2023-09-16 NOTE — Procedures (Signed)
 Patient Name: Maanya Hippert  MRN: [id number]  Epilepsy Attending: Arleene Lack  Referring Physician/Provider: Imogene Mana, NP Date: 09/16/2023 Duration: 24.51 mins  Patient history: 38 year old female who presented with seizures.  EEG evaluate for seizure.  Level of alertness: Awake, drowsy  AEDs during EEG study: LEV  Technical aspects: This EEG study was done with scalp electrodes positioned according to the 10-20 International system of electrode placement. Electrical activity was reviewed with band pass filter of 1-70Hz , sensitivity of 7 uV/mm, display speed of 23mm/sec with a 60Hz  notched filter applied as appropriate. EEG data were recorded continuously and digitally stored.  Video monitoring was available and reviewed as appropriate.  Description: The posterior dominant rhythm consists of 8-9 Hz activity of moderate voltage (25-35 uV) seen predominantly in posterior head regions, symmetric and reactive to eye opening and eye closing. Drowsiness was characterized by attenuation of the posterior background rhythm. Hyperventilation and photic stimulation were not performed.     IMPRESSION: This study is within normal limits. No seizures or epileptiform discharges were seen throughout the recording.  A normal interictal EEG does not exclude the diagnosis of epilepsy.   Emre Stock O Livana Yerian

## 2023-09-16 NOTE — ED Notes (Signed)
 Pt ambulated to restroom.
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
## 2023-09-16 NOTE — ED Notes (Signed)
 EEG tech at bedside.
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
## 2023-09-16 NOTE — Progress Notes (Signed)
 Per nurse patient not available at the moment for EEG lead placement due to patient resting. Tech will check back as schedule allows.
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
## 2023-09-16 NOTE — Progress Notes (Signed)
 EEG complete - results pending

## 2023-09-18 ENCOUNTER — Encounter (HOSPITAL_COMMUNITY): Payer: Self-pay

## 2023-09-18 ENCOUNTER — Emergency Department (HOSPITAL_COMMUNITY)

## 2023-09-18 ENCOUNTER — Emergency Department (HOSPITAL_COMMUNITY)
Admission: EM | Admit: 2023-09-18 | Discharge: 2023-09-18 | Disposition: A | Discharge status: Home / Self Care | Attending: Emergency Medicine | Admitting: Emergency Medicine

## 2023-09-18 ENCOUNTER — Other Ambulatory Visit: Payer: Self-pay

## 2023-09-18 DIAGNOSIS — Z89022 Acquired absence of left finger(s): Secondary | ICD-10-CM | POA: Insufficient documentation

## 2023-09-18 DIAGNOSIS — I10 Essential (primary) hypertension: Secondary | ICD-10-CM | POA: Diagnosis not present

## 2023-09-18 DIAGNOSIS — Y835 Amputation of limb(s) as the cause of abnormal reaction of the patient, or of later complication, without mention of misadventure at the time of the procedure: Secondary | ICD-10-CM | POA: Insufficient documentation

## 2023-09-18 DIAGNOSIS — R079 Chest pain, unspecified: Secondary | ICD-10-CM | POA: Diagnosis not present

## 2023-09-18 DIAGNOSIS — M79642 Pain in left hand: Secondary | ICD-10-CM | POA: Diagnosis present

## 2023-09-18 DIAGNOSIS — Z79899 Other long term (current) drug therapy: Secondary | ICD-10-CM | POA: Insufficient documentation

## 2023-09-18 DIAGNOSIS — R000 Tachycardia, unspecified: Secondary | ICD-10-CM | POA: Insufficient documentation

## 2023-09-18 DIAGNOSIS — T8742 Infection of amputation stump, left upper extremity: Secondary | ICD-10-CM | POA: Insufficient documentation

## 2023-09-18 DIAGNOSIS — L089 Local infection of the skin and subcutaneous tissue, unspecified: Secondary | ICD-10-CM

## 2023-09-18 LAB — CBC
HCT: 40.9 % (ref 36.0–46.0)
Hemoglobin: 12.6 g/dL (ref 12.0–15.0)
MCH: 27.2 pg (ref 26.0–34.0)
MCHC: 30.8 g/dL (ref 30.0–36.0)
MCV: 88.1 fL (ref 80.0–100.0)
Platelets: 386 10*3/uL (ref 150–400)
RBC: 4.64 MIL/uL (ref 3.87–5.11)
RDW: 15.4 % (ref 11.5–15.5)
WBC: 7.7 10*3/uL (ref 4.0–10.5)
nRBC: 0 % (ref 0.0–0.2)

## 2023-09-18 LAB — BASIC METABOLIC PANEL WITH GFR
Anion gap: 16 — ABNORMAL HIGH (ref 5–15)
BUN: 10 mg/dL (ref 6–20)
CO2: 19 mmol/L — ABNORMAL LOW (ref 22–32)
Calcium: 9.5 mg/dL (ref 8.9–10.3)
Chloride: 102 mmol/L (ref 98–111)
Creatinine, Ser: 0.85 mg/dL (ref 0.44–1.00)
GFR, Estimated: >60 mL/min (ref >60)
Glucose, Bld: 126 mg/dL — ABNORMAL HIGH (ref 70–99)
Potassium: 4 mmol/L (ref 3.5–5.1)
Sodium: 137 mmol/L (ref 135–145)

## 2023-09-18 LAB — TROPONIN I (HIGH SENSITIVITY): Troponin I (High Sensitivity): 4 ng/L (ref <18)

## 2023-09-18 MED ORDER — HYDROMORPHONE HCL 1 MG/ML IJ SOLN
1.0000 mg | Freq: Once | INTRAMUSCULAR | Status: AC
  Administered 2023-09-18: 1 mg via INTRAMUSCULAR

## 2023-09-18 MED ORDER — OXYCODONE HCL 5 MG PO TABS
5.0000 mg | ORAL_TABLET | Freq: Once | ORAL | Status: AC
  Administered 2023-09-18: 5 mg via ORAL
  Filled 2023-09-18: qty 1

## 2023-09-18 MED ORDER — SULFAMETHOXAZOLE-TRIMETHOPRIM 800-160 MG PO TABS
1.0000 | ORAL_TABLET | Freq: Two times a day (BID) | ORAL | 0 refills | Status: DC
Start: 2023-09-18 — End: 2023-09-18

## 2023-09-18 MED ORDER — HYDROCODONE-ACETAMINOPHEN 5-325 MG PO TABS
1.0000 | ORAL_TABLET | Freq: Once | ORAL | Status: AC
  Administered 2023-09-18: 1 via ORAL
  Filled 2023-09-18: qty 1

## 2023-09-18 MED ORDER — HYDROMORPHONE HCL 1 MG/ML IJ SOLN
1.0000 mg | Freq: Once | INTRAMUSCULAR | Status: DC
  Filled 2023-09-18: qty 1

## 2023-09-18 MED ORDER — HYDROMORPHONE HCL 1 MG/ML IJ SOLN: 1.0000 mg | Freq: Once | INTRAMUSCULAR | Status: DC

## 2023-09-18 MED ORDER — SULFAMETHOXAZOLE-TRIMETHOPRIM 800-160 MG PO TABS: 1.0000 | ORAL_TABLET | Freq: Two times a day (BID) | ORAL | 0 refills | Status: AC

## 2023-09-18 MED ORDER — PIPERACILLIN-TAZOBACTAM 3.375 G IVPB 30 MIN
3.3750 g | Freq: Once | INTRAVENOUS | Status: AC
  Administered 2023-09-18: 3.375 g via INTRAVENOUS

## 2023-09-18 MED ORDER — HYDROCODONE-ACETAMINOPHEN 5-325 MG PO TABS: 1.0000 | ORAL_TABLET | Freq: Four times a day (QID) | ORAL | 0 refills | Status: DC | PRN

## 2023-09-18 MED ORDER — OXYCODONE HCL 5 MG PO TABS: 10.0000 mg | ORAL_TABLET | Freq: Once | ORAL | Status: DC

## 2023-09-18 MED ORDER — HYDROCODONE-ACETAMINOPHEN 5-325 MG PO TABS: 1.0000 | ORAL_TABLET | Freq: Four times a day (QID) | ORAL | 0 refills | Status: AC | PRN

## 2023-09-18 MED ORDER — CALCIUM CARBONATE ANTACID 500 MG PO CHEW
1.0000 | CHEWABLE_TABLET | Freq: Once | ORAL | Status: AC
  Administered 2023-09-18: 200 mg via ORAL
  Filled 2023-09-18: qty 1

## 2023-09-18 MED ORDER — LIDOCAINE HCL (PF) 1 % IJ SOLN
30.0000 mL | Freq: Once | INTRAMUSCULAR | Status: DC
  Filled 2023-09-18: qty 30

## 2023-09-18 MED ORDER — VANCOMYCIN HCL 2000 MG/400ML IV SOLN
2000.0000 mg | Freq: Once | INTRAVENOUS | Status: AC
  Administered 2023-09-18: 2000 mg via INTRAVENOUS
  Filled 2023-09-18: qty 400

## 2023-09-18 MED ORDER — KETOROLAC TROMETHAMINE 30 MG/ML IJ SOLN: 30.0000 mg | Freq: Once | INTRAMUSCULAR | Status: DC

## 2023-09-18 MED ORDER — ONDANSETRON HCL 4 MG/2ML IJ SOLN
4.0000 mg | Freq: Once | INTRAMUSCULAR | Status: AC
  Administered 2023-09-18: 4 mg via INTRAVENOUS
  Filled 2023-09-18: qty 2

## 2023-09-18 MED ORDER — CEPHALEXIN 500 MG PO CAPS
500.0000 mg | ORAL_CAPSULE | Freq: Four times a day (QID) | ORAL | 0 refills | Status: AC
Start: 2023-09-18 — End: 2023-09-28

## 2023-09-18 MED ORDER — CEPHALEXIN 500 MG PO CAPS: 500.0000 mg | ORAL_CAPSULE | Freq: Four times a day (QID) | ORAL | 0 refills | Status: DC

## 2023-09-18 MED ORDER — MORPHINE SULFATE (PF) 4 MG/ML IV SOLN
4.0000 mg | Freq: Once | INTRAVENOUS | Status: AC
  Administered 2023-09-18: 4 mg via INTRAVENOUS
  Filled 2023-09-18: qty 1

## 2023-09-18 NOTE — Progress Notes (Signed)
 ED Pharmacy Antibiotic Sign Off An antibiotic consult was received from an ED provider for vancomycin  per pharmacy dosing for wound infection. A chart review was completed to assess appropriateness.  A single dose of zosyn placed by the ED provider.   The following one time order(s) were placed per pharmacy consult:  vancomycin  2000 mg x 1 dose  Further antibiotic and/or antibiotic pharmacy consults should be ordered by the admitting provider if indicated.   Thank you for allowing pharmacy to be a part of this patient's care.   Dionicio Fray, PharmD, BCPS 09/18/2023 5:17 PM ED Clinical Pharmacist -  [phone]
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
## 2023-09-18 NOTE — Discharge Instructions (Signed)
 Evaluation today revealed that you likely have infection in your left hand.  You have been effectively treated by Dr. Jonna Netter who was able to washout the hand and applied new nonadherent dressing at the bedside.  I am starting you on Keflex and Bactrim for antibiotic coverage at home.  Please follow-up with hand surgery.  If you have any swelling or redness that tracks up the arm, you develop a fever, worsening pain or any other concerning symptom please return to the ED for further evaluation.  Also sent a few tablets of Norco to your pharmacy as well.
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
## 2023-09-18 NOTE — Consult Note (Signed)
 Reason for Consult:L hand infection Referring Physician: ER  CC:My hand smells  HPI:  Heather Cherry is an 38 y.o. right handed female who presents with pain, swelling, foul smelling drainage from R hand.  Pt with complex injury to R hand, multiple surgeries.  Pt has not changed dressing since last clinic visit ? Date.  C/o increased pain, foul smelling drainage, presented to ER.  Denies fever.    .   Pain is rated at    6/10 and is described as sharp.  Pain is constant.  Pain is made better by rest/immobilization, worse with motion.   Associated signs/symptoms: Previous treatment:  multiple hand surgeries R  Past Medical History:  Diagnosis Date   Abnormal vaginal bleeding    Antiphospholipid antibody syndrome (HCC)    Anxiety    Depression    Diabetes mellitus without complication (HCC)    DVT (deep venous thrombosis) (HCC)    DVT of lower extremity (deep venous thrombosis) (HCC)    Fibroid    GERD (gastroesophageal reflux disease)    Graves disease    Hypertension    Obesity    Pulmonary emboli (HCC)    Pulmonary embolism (HCC)    Pulmonary hypertension (HCC)    Sinus congestion    Sleep apnea     Past Surgical History:  Procedure Laterality Date   ABDOMINAL HYSTERECTOMY     APPLICATION OF WOUND VAC Left 09/04/2023   Procedure: APPLICATION, WOUND VAC;  Surgeon: Ltanya Rummer, MD;  Location: [facility] OR;  Service: Orthopedics;  Laterality: Left;   APPLICATION OF WOUND VAC Left 09/09/2023   Procedure: APPLICATION, WOUND VAC;  Surgeon: Ltanya Rummer, MD;  Location: [facility] OR;  Service: Orthopedics;  Laterality: Left;   CHOLECYSTECTOMY     COLONOSCOPY     HERNIA REPAIR     as a baby   INCISION AND DRAINAGE OF WOUND Left 09/04/2023   Procedure: IRRIGATION AND DEBRIDEMENT WOUND;  Surgeon: Ltanya Rummer, MD;  Location: [facility] OR;  Service: Orthopedics;  Laterality: Left;   INCISION AND DRAINAGE OF WOUND Left 09/09/2023   Procedure: IRRIGATION AND DEBRIDEMENT WOUND;  Surgeon: Ltanya Rummer, MD;   Location: [facility] OR;  Service: Orthopedics;  Laterality: Left;   IVC FILTER REMOVAL N/A 04/29/2017   Procedure: IVC FILTER REMOVAL;  Surgeon: Celso College, MD;  Location: [facility] IN[facility]ASIVE [facility];  Service: Cardiovascular;  Laterality: N/A;   LAPAROSCOPIC GASTRIC SLEEVE RESECTION  08/25/2023   OPEN REDUCTION INTERNAL FIXATION (ORIF) DISTAL PHALANX Left 08/30/2023   Procedure: 1. Revision amputation of left ring and small fingers at the MCP joints 2. I&D left 2nd, 3rd, 4th and 5th open CMC dislocations 3. ORIF of left 2nd, 3rd, 4th and 5th open [facility] dislocations 4. I&D of left capitate open fracture 5. ORIF of left capitate open fracture 6. Adjacent tissue transfer rotational flap 100 cm 7. Full-thickness skin autograft for primary coverage;  Surgeon: Delmar Ferrara,    PERIPHERAL VASCULAR CATHETERIZATION N/A 03/25/2016   Procedure: IVC Filter Insertion;  Surgeon: Jackquelyn Mass, MD;  Location: [facility];  Service: Cardiovascular;  Laterality: N/A;   WISDOM TOOTH EXTRACTION      Family History  Problem Relation Age of Onset   Cancer Paternal Grandfather    Breast cancer Paternal Grandmother    Rheumatologic disease Neg Hx    Colon cancer Neg Hx    Endometrial cancer Neg Hx    Prostate cancer Neg Hx    Pancreatic cancer Neg Hx  Ovarian cancer Neg Hx     Social History:  reports that she has never smoked. She has never used smokeless tobacco. She reports current alcohol use. She reports current drug use. Frequency: 7.00 times per week. Drug: Marijuana.  Allergies:  Allergies  Allergen Reactions   Lisinopril Cough and Swelling   Lisinopril Cough    Medications: I have reviewed the patient's current medications.  Results for orders placed or performed during the hospital encounter of 09/18/23 (from the past 48 hours)  Basic metabolic panel     Status: Abnormal   Collection Time: 09/18/23  2:23 PM  Result Value Ref Range   Sodium 137 135 - 145 mmol/L   Potassium 4.0 3.5 - 5.1 mmol/L    Chloride 102 98 - 111 mmol/L   CO2 19 (L) 22 - 32 mmol/L   Glucose, Bld 126 (H) 70 - 99 mg/dL    Comment: Glucose reference range applies only to samples taken after fasting for at least 8 hours.   BUN 10 6 - 20 mg/dL   Creatinine, Ser 1.61 0.44 - 1.00 mg/dL   Calcium  9.5 8.9 - 10.3 mg/dL   GFR, Estimated >09 >60 mL/min    Comment: (NOTE) Calculated using the CKD-EPI Creatinine Equation (2021)    Anion gap 16 (H) 5 - 15    Comment: Performed at [facility], [address] [place], [place] [address]  CBC     Status: None   Collection Time: 09/18/23  2:23 PM  Result Value Ref Range   WBC 7.7 4.0 - 10.5 K/uL   RBC 4.64 3.87 - 5.11 MIL/uL   Hemoglobin 12.6 12.0 - 15.0 g/dL   HCT 81.1 91.4 - 78.2 %   MCV 88.1 80.0 - 100.0 fL   MCH 27.2 26.0 - 34.0 pg   MCHC 30.8 30.0 - 36.0 g/dL   RDW 95.6 21.3 - 08.6 %   Platelets 386 150 - 400 K/uL   nRBC 0.0 0.0 - 0.2 %    Comment: Performed at [facility], [address] [place], [place] [address]  Troponin I (High Sensitivity)     Status: None   Collection Time: 09/18/23  2:23 PM  Result Value Ref Range   Troponin I (High Sensitivity) 4 <18 ng/L    Comment: (NOTE) Elevated high sensitivity troponin I (hsTnI) values and significant  changes across serial measurements may suggest ACS but many other  chronic and acute conditions are known to elevate hsTnI results.  Refer to the "Links" section for chest pain algorithms and additional  guidance. Performed at [facility], [address] [place], [place] [address]     DG Hand Complete Left Result Date: 09/18/2023 CLINICAL DATA:  Hand pain, foul smell. EXAM: LEFT HAND - COMPLETE 3+ VIEW COMPARISON:  None Available. FINDINGS: Interval amputation of the 4th and 5th digits at the level of the MTP joint. Pins noted within the proximal metacarpals and distal carpal row. Extensive radiopaque foreign bodies throughout the soft tissues with diffuse soft tissue swelling. No  visible acute fracture. No bone destruction to suggest osteomyelitis. IMPRESSION: Fourth and 5th digit amputation. Pins within the proximal carpals and distal carpal row. No bone destruction to suggest osteomyelitis. Electronically Signed   By: Janeece Mechanic M.D.   On: 09/18/2023 17:53   DG Chest 1 View Result Date: 09/18/2023 CLINICAL DATA:  Chest pain, shortness of breath EXAM: CHEST  1 VIEW COMPARISON:  08/30/2023 FINDINGS: The heart size  and mediastinal contours are within normal limits. Both lungs are clear. The visualized skeletal structures are unremarkable. IMPRESSION: No acute abnormality of the lungs in frontal projection. Electronically Signed   By: Fredricka Jenny M.D.   On: 09/18/2023 15:11    Pertinent items are noted in HPI. Temp:  [98.2 F (36.8 C)] 98.2 F (36.8 C) (06/07 1409) Pulse Rate:  [113] 113 (06/07 1409) Resp:  [16] 16 (06/07 1409) BP: (157)/(93) 157/93 (06/07 1409) SpO2:  [98 %] 98 % (06/07 1409) Weight:  [153.3 kg] 153.3 kg (06/07 1406) General appearance: alert and cooperative Resp: clear to auscultation bilaterally Cardio: regular rate and rhythm Extremities: R hand with evidence of purulent drainage, beefy red tissue dorsally, no real proximal wrist swelling/erythema, some obvious non viable tissue in volar ulnar palm and dorsum   Assessment: S/p multiple hand surgeries (trauma); with infection Plan: Hand prepped with betadine , irrigated with saline, multiple areas debrided sharply with scissors (full thickness skin and subcutaneous tissue); hand redressed in non adherent dressing.  Will need close f/u; will place on oral antibiotics - given Zosyn IV while in ER.    Zyair Russi C Danella Philson 09/18/2023, 6:45 PM

## 2023-09-28 ENCOUNTER — Ambulatory Visit: Attending: Physician Assistant | Admitting: Speech Pathology

## 2023-09-28 DIAGNOSIS — S61402A Unspecified open wound of left hand, initial encounter: Secondary | ICD-10-CM | POA: Insufficient documentation

## 2023-09-28 DIAGNOSIS — S41102A Unspecified open wound of left upper arm, initial encounter: Secondary | ICD-10-CM | POA: Insufficient documentation

## 2023-09-28 DIAGNOSIS — M25561 Pain in right knee: Secondary | ICD-10-CM | POA: Insufficient documentation

## 2023-09-28 DIAGNOSIS — M25562 Pain in left knee: Secondary | ICD-10-CM | POA: Diagnosis not present

## 2023-09-28 DIAGNOSIS — S0990XA Unspecified injury of head, initial encounter: Secondary | ICD-10-CM | POA: Insufficient documentation

## 2023-09-28 DIAGNOSIS — I272 Pulmonary hypertension, unspecified: Secondary | ICD-10-CM | POA: Insufficient documentation

## 2023-09-28 DIAGNOSIS — R41841 Cognitive communication deficit: Secondary | ICD-10-CM | POA: Insufficient documentation
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
## 2023-09-29 ENCOUNTER — Encounter: Payer: Self-pay | Admitting: Speech Pathology

## 2023-10-05 ENCOUNTER — Ambulatory Visit: Admitting: Occupational Therapy

## 2023-10-05 ENCOUNTER — Ambulatory Visit: Admitting: Speech Pathology

## 2023-10-05 ENCOUNTER — Encounter: Payer: Self-pay | Admitting: Speech Pathology

## 2023-10-05 DIAGNOSIS — R41841 Cognitive communication deficit: Secondary | ICD-10-CM | POA: Diagnosis not present
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
## 2023-10-11 ENCOUNTER — Ambulatory Visit

## 2023-10-11 DIAGNOSIS — M25562 Pain in left knee: Secondary | ICD-10-CM

## 2023-10-11 DIAGNOSIS — M25561 Pain in right knee: Secondary | ICD-10-CM

## 2023-10-11 DIAGNOSIS — R41841 Cognitive communication deficit: Secondary | ICD-10-CM | POA: Diagnosis not present

## 2023-10-19 ENCOUNTER — Encounter: Payer: Self-pay | Admitting: Speech Pathology

## 2023-10-19 ENCOUNTER — Ambulatory Visit: Attending: Physician Assistant | Admitting: Physical Therapy

## 2023-10-19 ENCOUNTER — Ambulatory Visit: Admitting: Speech Pathology

## 2023-10-19 DIAGNOSIS — I272 Pulmonary hypertension, unspecified: Secondary | ICD-10-CM | POA: Insufficient documentation

## 2023-10-19 DIAGNOSIS — R41841 Cognitive communication deficit: Secondary | ICD-10-CM

## 2023-10-19 DIAGNOSIS — M25561 Pain in right knee: Secondary | ICD-10-CM | POA: Insufficient documentation

## 2023-10-19 DIAGNOSIS — M25562 Pain in left knee: Secondary | ICD-10-CM | POA: Insufficient documentation

## 2023-10-19 DIAGNOSIS — Z87820 Personal history of traumatic brain injury: Secondary | ICD-10-CM | POA: Insufficient documentation
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
## 2023-10-21 ENCOUNTER — Other Ambulatory Visit (HOSPITAL_COMMUNITY): Payer: Self-pay

## 2023-10-26 ENCOUNTER — Ambulatory Visit: Admitting: Speech Pathology

## 2023-10-26 ENCOUNTER — Ambulatory Visit: Admitting: Physical Therapy

## 2023-10-28 ENCOUNTER — Emergency Department (HOSPITAL_COMMUNITY)

## 2023-10-28 ENCOUNTER — Emergency Department (HOSPITAL_COMMUNITY)
Admission: EM | Admit: 2023-10-28 | Discharge: 2023-10-28 | Disposition: A | Discharge status: Home / Self Care | Attending: Emergency Medicine | Admitting: Emergency Medicine

## 2023-10-28 DIAGNOSIS — M79604 Pain in right leg: Secondary | ICD-10-CM | POA: Diagnosis not present

## 2023-10-28 DIAGNOSIS — E119 Type 2 diabetes mellitus without complications: Secondary | ICD-10-CM | POA: Diagnosis not present

## 2023-10-28 DIAGNOSIS — M25561 Pain in right knee: Secondary | ICD-10-CM | POA: Diagnosis present

## 2023-10-28 DIAGNOSIS — G8929 Other chronic pain: Secondary | ICD-10-CM | POA: Insufficient documentation

## 2023-10-28 DIAGNOSIS — I10 Essential (primary) hypertension: Secondary | ICD-10-CM | POA: Insufficient documentation

## 2023-10-28 NOTE — Discharge Instructions (Signed)
 Return for any problem.  ?

## 2023-10-28 NOTE — Progress Notes (Signed)
 Orthopedic Tech Progress Note Patient Details:  Heather Cherry 1986/01/26 [id number]  Ortho Devices Type of Ortho Device: Knee Immobilizer Ortho Device/Splint Location: left Ortho Device/Splint Interventions: Ordered, Application, Adjustment   Post Interventions Patient Tolerated: Well Instructions Provided: Adjustment of device, Care of device  Waylan Thom Loving 10/28/2023, 5:45 PM
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
## 2023-10-28 NOTE — ED Triage Notes (Signed)
 Patient in today reporting redness, inflammation, and warmth to the touch that right knee.

## 2023-10-28 NOTE — Progress Notes (Signed)
 RLE venous duplex has been completed.  Preliminary results given to Dr. Laurice.   Results can be found under chart review under CV PROC. 10/28/2023 6:11 PM Telia Amundson RVT, RDMS

## 2023-10-28 NOTE — ED Provider Notes (Signed)
 Patient is seen after prior ED provider.  All results discussed with the patient.  Patient has a plan to follow-up closely with orthopedics for further evaluation of her right knee.  Importance of close follow-up is stressed.  Strict return precautions given understood.   Laurice Maude BROCKS, MD 10/28/23 [phone]

## 2023-11-04 ENCOUNTER — Ambulatory Visit: Admitting: Physical Therapy

## 2023-11-04 ENCOUNTER — Encounter: Payer: Self-pay | Admitting: Speech Pathology

## 2023-11-04 ENCOUNTER — Ambulatory Visit: Admitting: Speech Pathology

## 2023-11-04 DIAGNOSIS — R41841 Cognitive communication deficit: Secondary | ICD-10-CM

## 2023-11-04 DIAGNOSIS — M25561 Pain in right knee: Secondary | ICD-10-CM

## 2023-11-04 DIAGNOSIS — M25562 Pain in left knee: Secondary | ICD-10-CM
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
## 2023-11-11 ENCOUNTER — Ambulatory Visit: Admitting: Physical Therapy

## 2023-11-11 ENCOUNTER — Encounter: Payer: Self-pay | Admitting: Speech Pathology

## 2023-11-11 ENCOUNTER — Ambulatory Visit: Admitting: Speech Pathology

## 2023-11-11 DIAGNOSIS — M25562 Pain in left knee: Secondary | ICD-10-CM

## 2023-11-11 DIAGNOSIS — M25561 Pain in right knee: Secondary | ICD-10-CM | POA: Diagnosis not present

## 2023-11-11 DIAGNOSIS — R41841 Cognitive communication deficit: Secondary | ICD-10-CM
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
## 2024-03-14 ENCOUNTER — Emergency Department: Admission: EM | Admit: 2024-03-14 | Discharge: 2024-03-14 | Discharge status: Against Medical Advice

## 2024-03-14 NOTE — Consults (Signed)
 [facility] Consultation  [facility] Consultation  Ordering Physician: No att. providers found Consulting Physician: Darleene Norleen Railing, MD Ethics: Full Code   PCP: Luke MARLA Manns, MD [phone]  Reason For Consult: Pleurisy and bradycardia  Assessment & Plan:  Assessment: This is a 38 y.o. female who is being seen in consultation for:  Active Problems:   Bradycardia, sinus   Pleurisy  Is a 38 year old female being seen in consultation for pleurisy and bradycardia.  Patient's heart rate was as low as 37 but she was in a normal sinus rhythm.  Nothing acute has been found on workup and it seems that this is most likely related to her metoprolol .  Patient takes metoprolol  in the evening and her heart rate has improved significantly over the past couple of hours and is now ranging between 65 and 90.  She is ambulating without any lightheadedness.  Patient also seems to be having pleurisy and the etiology for this is not known.  She has no evidence of pneumonia, pulmonary embolism, or coronary disease.  She is already on Lovenox  for chronic pulmonary embolisms although she has not been very compliant with this.  Patient will need to go back to taking the Lovenox  and I am recommending that she take prednisone  for the next several days until the pleurisy resolves.  Plan Medication induced bradycardia Hold metoprolol  until the evening of 03/15/2024 Resume metoprolol  at decreased dose of 50 mg daily Track heart rate with pulse oximeter If heart rate drops again after decreasing to 50 mg then recommend discontinue metoprolol  completely and contacting cardiology or primary care doctor Patient provided with a pulse oximeter to check heart rate at home  Pleurisy Solu-Medrol 60 mg IV x 1 Prednisone  40 mg daily for 4 days   Subjective  History of Present Illness and Current Hospital Course: Heather Cherry is a 38 y.o. female who presented to the ED with concerns for chest and back pain that is worse with breathing.  Patient has history of antiphospholipid with prior pulmonary embolisms.  She is on chronic Lovenox  therapy but she had not been taking her injections over the past several days.  Patient had an episode where she felt flushed and short of breath earlier on 03/14/2024 and then she subsequently started having left sided chest and back pain that worsened with inspiration.  She felt like she was likely having a pulmonary embolism and therefore came to the emergency department.  In the emergency department initial workup revealed that she was bradycardic with heart rate in the 40s.  EKG showed a sinus bradycardia.  Patient underwent CTA of the chest which was unremarkable and did not show any evidence of pneumonia or pulmonary embolism.  Patient is noted to be on metoprolol  chronically for high heart rate.  Patient stated that she has lost almost 100 pounds and she is wondering if the metoprolol  was starting to be too strong.  There was question of whether patient needed to be admitted to the hospital for this.  By the time I saw the patient her heart rate had already started improving on its own.  Patient stated that she does take the metoprolol  in the evening and was due to take her next dose.  By the time I saw her her heart rate was already improved into the 70s and she had just eaten.  I subsequently ambulated the patient around in the emergency department over  500 feet and her heart rate at its highest was 90 and she did not have any bradycardia or lightheadedness.  Past Medical History[1]  Past Surgical History[2]  Family History[3]  Social History: Social History[4]  Allergies: Allergies[5]  Medications: Prior to Admission medications  Medication Sig Start Date End Date Taking? Authorizing Provider  acetaZOLAMIDE  (DIAMOX ) 250 mg tablet Take 1 tablet by mouth twice daily  01/05/24  Yes Norleen JAYSON Blumenthal, MD  Calcium  Citrate-Vitamin D (CALCIUM  CITRATE +D PO) Take 1 tablet by mouth daily.   Yes Historical Provider, MD  Cholecalciferol (VITAMIN D) 50 mcg (2000 UT) tablet Take one tablet (2,000 Units dose) by mouth daily. 08/26/23  Yes Jamie L Szymarek, FNP  DULoxetine  HCl (CYMBALTA ) 60 mg capsule Take one capsule (60 mg dose) by mouth every morning. 01/25/24  Yes Historical Provider, MD  enoxaparin  (LOVENOX ) 100 mg/mL injection Inject 2 mLs (200 mg dose) into the skin daily. 02/15/24  Yes Romana RAYMOND Quay, MD  ferrous sulfate  325 (65 FE) MG tablet Take one tablet (325 mg dose) by mouth at bedtime.   Yes Historical Provider, MD  gabapentin  (NEURONTIN ) 300 mg capsule Take three capsules (900 mg dose) by mouth 3 (three) times a day. 09/10/23  Yes Historical Provider, MD  hydrOXYzine  HCl (ATARAX ) 25 mg tablet Take one tablet (25 mg dose) by mouth daily as needed for Anxiety. Patient not taking: No sig reported 09/10/23   Historical Provider, MD  hydrOXYzine  pamoate (VISTARIL ) 100 mg capsule Take one capsule (100 mg dose) by mouth at bedtime as needed. Patient not taking: No sig reported 01/26/24   Historical Provider, MD  LevETIRAcetam  (KEPPRA  XR) 750 mg TB24 Take two tablets (1,500 mg dose) by mouth at bedtime. 10/26/23  Yes Norleen JAYSON Blumenthal, MD  lurasidone  HCl (LATUDA ) 20 mg TABS tablet Take one tablet (20 mg dose) by mouth at bedtime. Patient not taking: No sig reported    Historical Provider, MD  methimazole  (TAPAZOLE ) 10 MG tablet Take three tablets (30 mg dose) by mouth daily. 03/01/24  Yes Donnice FORBES Lipps, MD  metoprolol  succinate (TOPROL -XL) 100 mg 24 hr tablet TAKE ONE TABLET BY MOUTH DAILY. APPOINTMENT REQUIRED FOR FUTURE REFILLS. 11/08/23  Yes Youlanda Juba, MD  Multiple Vitamins-Iron  (TAB-A-VITE WITH IRON ) TABS Take one tablet by mouth daily. 09/11/23  Yes Historical Provider, MD  naloxone [facility]) nasal spray (TAKE HOME PACK) one spray by Intranasal route once. 02/14/24   Yes Historical Provider, MD  omeprazole (PRILOSEC) 40 mg capsule Take one capsule (40 mg dose) by mouth daily. Patient not taking: Reported on 03/14/2024 08/26/23   Jamie L Szymarek, FNP  oxyCODONE  HCl (ROXICODONE ) 5 mg immediate release tablet Take two tablets (10 mg dose) by mouth every 6 (six) hours. Takes 10mg  10/22/23  Yes Historical Provider, MD  QUEtiapine  fumarate (SEROQUEL ) 200 mg tablet Take one tablet (200 mg dose) by mouth as needed. for sleep   Yes Historical Provider, MD  SPRAVATO, 84 MG DOSE, 28 MG/DEVICE SOPK SMARTSIG:84 Milligram(s) Both Nares Once a Week Patient taking differently: 84 mg by Nasal route once a week. 12/24/23  Yes Historical Provider, MD  valACYclovir  (VALTREX ) 500 mg tablet Take one tablet (500 mg dose) by mouth daily. *Suppressive therapy 02/23/23  Yes Luke MARLA Manns, MD  VALTOCO  20 MG DOSE 2 x 10 MG/0.1ML LQPK 20 mg by Nasal route 1 (one) time if needed. 09/16/23   Historical Provider, MD  zolpidem  tartrate (AMBIEN ) 10 mg tablet TAKE 1 TABLET BY MOUTH AS NEEDED FOR  INSOMNIA   Yes Historical Provider, MD    Objective:  Physical Exam: BP (!) 157/77   Pulse 66   Temp 98.3 F (36.8 C) (Oral)   Resp 15   Ht 1.753 m (5' 9)   Wt 136.1 kg (300 lb)   LMP 08/18/2021 (Exact Date)   SpO2 95%   BMI 44.30 kg/m  Physical Exam Constitutional:      Appearance: She is obese.  HENT:     Head: Normocephalic and atraumatic.  Eyes:     Conjunctiva/sclera: Conjunctivae normal.     Pupils: Pupils are equal, round, and reactive to light.  Cardiovascular:     Rate and Rhythm: Normal rate and regular rhythm.     Heart sounds: Normal heart sounds.  Musculoskeletal:        General: Normal range of motion.     Right lower leg: No edema.     Left lower leg: No edema.     Comments: No tenderness along the left chest wall or back  Pulmonary:     Effort: Pulmonary effort is normal.     Breath sounds: Normal breath sounds.  Abdominal:     General: Bowel sounds are normal.      Palpations: Abdomen is soft.  Skin:    General: Skin is warm and dry.  Neurological:     General: No focal deficit present.     Mental Status: She is alert and oriented to person, place, and time.  Psychiatric:        Mood and Affect: Mood normal.        Behavior: Behavior normal.    Labs and Imaging: Reviewed  EKG: No results found.  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  On Lovenox  with IVC Filter   History of transfusion    Hypertension    patient denies   Menorrhagia 09/18/2014   Migraines 12/04/2015   Dx 2008, normal MRI   OSA (obstructive sleep apnea)    uses mouth guard   Pseudotumor cerebri    Pulmonary embolism (*)    Seizure (*) 09/2023   x 1  [2] Past Surgical History: Procedure Laterality Date   Cholecystectomy     Laparoscopic Dr Johnie   Colonoscopy N/A 04/09/2017   Procedure: COLONOSCOPY;  Surgeon: Steffan LELON Shana Mickey., MD;  Location: [facility] OR;  Service: Gastroenterology;  Laterality: N/A;   Colonoscopy w/ biopsies  04/09/2017   Procedure: COLONOSCOPY W/ BIOPSY;  Surgeon: Steffan LELON Shana Mickey., MD;  Location: [facility] OR;  Service: Gastroenterology;;   Finger amputation     left 5 digiit and ring finger   Gastric bypass  08/2023   Hysterectomy  06/25/2022   Ivc filter insertion     Upper gastrointestinal endoscopy  07/16/2020  [3] Family History Problem Relation Name Age of Onset   Cancer Paternal Grandmother     Colon cancer  Neg  Hx     Colon polyps Neg Hx     No Known Problems Father      Mother    [4] Social History Socioeconomic History   Marital status: Single   Number of children: 0  Tobacco Use   Smoking status: Never    Passive exposure: Never   Smokeless tobacco: Never  Vaping Use   Vaping status: Never Used  Substance and Sexual Activity   Alcohol use: Not Currently    Alcohol/week: 1.0 standard drink of alcohol    Types: 1 Shots of liquor per week    Comment: Occ liquor    Drug use: Yes    Types: Marijuana    Comment: smokes sometimes   Sexual activity: Not Currently    Partners: Male    Birth control/protection: Condom  [5] Allergies Allergen Reactions   Lisinopril Swelling

## 2024-03-14 NOTE — ED Triage Notes (Signed)
 Pt called with no reply. Did not see pt enter ED after seen leaving with visitor

## 2024-03-14 NOTE — ED Triage Notes (Signed)
 Pt arrived to ED lobby with EMS in wheelchair. Pt looked around lobby and stood and said she aint staying here for 3 hours. Pt visitor came over to pt and spoke to pt and then was seen walking out of waiting room doors with patient.
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
## 2024-04-03 NOTE — H&P (Signed)
 Chief Complaint No Principal Problem: There is no principal problem currently on the Problem List. Please update the Problem List and refresh.  History Of Present Illness Heather Cherry is a 38 y.o. female presenting with hx of left hand degloving injury and 4th and 5th digit amputations presenting for surgery today.     Past Medical History She has a past medical history of Chronic pulmonary embolism    (CMD), Disease of thyroid  gland, DVT (deep vein thrombosis) in pregnancy (CMD) (2017), History of cholecystectomy, Hypertension, Migraines, OSA (obstructive sleep apnea), and Seizures, post-traumatic    (CMD).  Surgical History She has a past surgical history that includes Gastrectomy (2025); IR IVC Filter Placement; and Partial hysterectomy.   Family History Her family history is not on file.  Social History She reports that she has never smoked. She has never used smokeless tobacco. She reports that she does not currently use alcohol. She reports current drug use. Drug: Marijuana.   Allergies Lisinopril  Medications Prescriptions Prior to Admission[1]   Review of Systems   Physical Exam   Last Recorded Vitals Blood pressure 123/65, pulse 76, temperature 98.2 F (36.8 C), temperature source Oral, height 1.753 m (5' 9), weight 135 kg (298 lb 1 oz), SpO2 100%.  Relevant Results    Assessment/Plan Active Problems:   Seizures, post-traumatic    (CMD)   Morbid obesity (CMD)   ADHD   Attestation: I have reviewed the history and physicals located in the electronic medical record, that have been performed within the 30 days prior to surgery today, and I concur with the assessment therein. I have seen and examined the patient today and I find no substantive changes that would alter the course of therapy/surgery today.     Following a careful explanation of the risks and benefits of the procedure, the patient was given the opportunity to ask questions and have those questions  answered.  Patient acknowledged understanding of the risks and consented to the procedure or operation.    Plan is for  left hand Four-flap zplasty for 1st webspace deepening, index and middle MP capsulectomy and extensor tenolysis to help improve her mobility.   This was reviewed with my attending Dr. Dorise Emery  Signed By: Ruth Sorrel, MD    April 03, 2024 2:15 PM            [1] Medications Prior to Admission  Medication Sig   acetaminophen  (TYLENOL ) 500 mg tablet Take 500 mg by mouth as needed for mild pain (1-3).   calcium  citrate-vitamin D3 (CITRACAL) 315 mg (63 mg calcium )-250 units vit D tab Take 1 tablet by mouth at bedtime.   DULoxetine  (CYMBALTA ) 60 mg capsule Take 60 mg by mouth at bedtime.   enoxaparin  (LOVENOX ) 80 mg/0.8 mL syrg Inject 200 mg under the skin at bedtime.   esketamine (SPRAVATO) 84 mg (28 mg x 3) spry 56 mg by intranasal route every Monday.   ferrous gluconate 324 mg (38 mg iron ) tab tablet Take 38 mg of iron  by mouth at bedtime.   gabapentin  (NEURONTIN ) 300 mg capsule Take 300 mg by mouth in the morning and 300 mg at noon and 300 mg in the evening.   levETIRAcetam  (KEPPRA  XR) 750 mg Tb24 Take 1,500 mg by mouth at bedtime.   methIMAzole  (TAPAZOLE ) 10 mg tablet Take 30 mg by mouth at bedtime.   metoprolol  succinate (TOPROL  XL) 100 mg 24 hr tablet Take 50 mg by mouth at bedtime.   oxyCODONE  (ROXICODONE )  10 mg tab Take 10 mg by mouth every 6 (six) hours as needed for moderate pain (4-6).   QUEtiapine  (SEROquel ) 200 mg tablet Take 200 mg by mouth nightly as needed.   valACYclovir  (VALTREX ) 500 mg tablet Take 500 mg by mouth at bedtime.   Valtoco  20 mg/2 spray (10mg /0.72mL x2) spry by intranasal route nightly as needed.   zolpidem  (Ambien ) 10 mg tablet Take 10 mg by mouth as needed for sleep.   acetaZOLAMIDE  (DIAMOX ) 250 mg tablet Take 250 mg by mouth 2 (two) times a day. (Patient not taking: Reported on 03/30/2024)   hydrOXYzine  (ATARAX )  25 mg tablet Take 25 mg by mouth 3 (three) times a day as needed for anxiety. (Patient not taking: Reported on 03/30/2024)   lurasidone  (LATUDA ) 20 mg tablet Take 20 mg by mouth nightly. (Patient not taking: Reported on 03/30/2024)   omeprazole (PriLOSEC) 40 mg DR capsule Take 40 mg by mouth daily. (Patient not taking: Reported on 03/30/2024)
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
## 2024-04-17 NOTE — Progress Notes (Signed)
 DAILY TREATMENT/ASSESSMENT NOTE  Patient Name:  Heather Cherry Date of Birth:  09-16-1985 Today's Date:  April 17, 2024 Referring Provider: Kerney Pride, MD Visit # : 2 Encounter date: 04/17/2024   Certification Period: 04/11/24 to 06/10/24  Date for OT Re-Evaluation: 05/12/24    PREOPERATIVE DIAGNOSES: 1. 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 2. Extension contractures of left index and long finger metacarpophalangeal (MCP) joints with extensor tendon adhesions.  POSTOPERATIVE DIAGNOSES: 1. 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 2. Extension contractures of left index and long finger metacarpophalangeal (MCP) joints with extensor tendon adhesions.  PROCEDURES: 1. Left 1st web space deepening with 4-flap Z-plasty. 2. Dorsal capsulectomies of the left index and long metacarpophalangeal joints. 3. Extensor tendon tenolysis of the extensor digitorum communis to index, extensor indicis proprius, extensor digitorum communis to long.  Encounter date: 04/11/2024  Visit # 1 Precautions: Healing surgical sites L hand      Subjective   Subjective: Patient reported that she has only had therapy one time since surgery, despite the doctor requesting 5 days a week.   Pain Level: Pain: 7/10; constant and dull  Pain Location: Left hand   Objective and Treatment  Objective Findings:   Thumb: MP: 0/25 IP: 50   IF:  MP: 0/35 PIP: 0/60 DIP: 0/0  MF:  MP: 0/15 PIP: -30/55 DIP: 0/0    Interventions  CPT INTERVENTION EXS/ACTIVITY DESCRIPTION  THERAPEUTIC EXERCISE (02889) Purpose: To increase, ROM, muscle strength, flexibility, and endurance Active blocking of digits 2-3 10 reps, 3 sets  Active blocking of the thumb 10 reps, 2 sets  Place and hold left thumb in composite flexion 5 second holds, 6 reps  Place and hold left thumb/IF in opposition 5 second holds 8 reps Active MP flexion with IP extended 10 reps       NEUROMUSCULAR RE-EDUCATION (02887)  Purpose: To  improve motor recruitment, coordination, proprioception, balance and posture; promote cortical reorganization to rational Active assistive ROM of the digits with emphasis on activation of the extrinsic flexors to achieve claw  Active assistive ROM with emphasis on activation of lumbricals    THERAPEUTIC ACTIVITY (02469) Purpose: To increase capacity for  []  lifting and carrying []  reaching []  pushing and pulling  []  grasping, pinching []  ADL's & house activities []  recreational & work activities   SELF CARE (02464) Purpose:  [] ADL training [] Compensatory training [] safety instruction [] Meal Prep [] Use of Adaptive Equip.   ORTHOTIC MANAGEMENT  (97760/97763)   Purpose:  Fabrication, modification, adjustment block   PROSTHETIC FIT & TRAINING (97761/97763)  Purpose:  Optimize ability to utilize prosthetic(s) to carry out meaningful occupation.   INDEPENDENT EXERCISE   Performed without direct 1:1 therapist contact, and not as part of group therapy.    MANUAL THERAPY (97140) Purpose: To improve  [x]  soft tissue mobility [x]  joint mobility []  decrease mm guarding [x]  decrease edema [x]  scar mobility to rational Edema, scar and STM to the left hand through forearm  Manual ROM of digits 1-3 with prolong moderate to high load stretch  Isolated and composite joint stretch performed  Wrist mobilization in all planes  Escar removed    MODALITIES Purpose:  []  promote tissue healing []  reduce mm spasm []  decr pain and swelling []  desensitization []  increase blood flow     Treatment Time & Charges  Today's Evaluation/Treatment: 0945 - 1030 Total Time: 45  Charges Total Time Code Treatment Minutes: 45  Therapeutic Charges Time Entry  Therapeutic ExerciseTime Entry: 12 Neuromuscular Re-education Time  Entry: 8 Manual Therapy Time Entry : 25   Assessment  Assessment:  Patient presents 2 weeks s/p 1. Left 1st web space deepening with 4-flap Z-plasty. 2. Dorsal  capsulectomies of the left index and long metacarpophalangeal joints. 3. Extensor tendon tenolysis of the extensor digitorum communis to index, extensor indicis proprius, extensor digitorum communis to long.  Stiches have been removed, no signs of infection observed.  Focus of treatment was on decreasing edema, optimizing mobility.  Response to skilled care was good as evidenced by objective measurements.  Patient was able to achieved thumb/IF opposition at the end of the session.  Patient  continues to benefit fro skilled occupational therapy to promote tendon gliding, joint mobility and desired healing to optimize her ability to utilize the hand for desired daily occupations.    Plan  Continue POC 3 times a week   Marcie Siebert, [place] 04/17/2024 11:10 AM  [id number] [address] [place] [address] Dept: [phone] Dept Fax: [phone]

## 2024-04-18 ENCOUNTER — Other Ambulatory Visit: Payer: Self-pay

## 2024-04-18 ENCOUNTER — Emergency Department (HOSPITAL_COMMUNITY)

## 2024-04-18 ENCOUNTER — Emergency Department (HOSPITAL_COMMUNITY)
Admission: EM | Admit: 2024-04-18 | Discharge: 2024-04-18 | Disposition: A | Discharge status: Home / Self Care | Attending: Emergency Medicine | Admitting: Emergency Medicine

## 2024-04-18 ENCOUNTER — Encounter (HOSPITAL_COMMUNITY): Payer: Self-pay

## 2024-04-18 DIAGNOSIS — G40909 Epilepsy, unspecified, not intractable, without status epilepticus: Secondary | ICD-10-CM | POA: Diagnosis present

## 2024-04-18 DIAGNOSIS — R569 Unspecified convulsions: Secondary | ICD-10-CM

## 2024-04-18 LAB — CBC WITH DIFFERENTIAL/PLATELET
Abs Immature Granulocytes: 0.03 K/uL (ref 0.00–0.07)
Basophils Absolute: 0 K/uL (ref 0.0–0.1)
Basophils Relative: 0 %
Eosinophils Absolute: 0 K/uL (ref 0.0–0.5)
Eosinophils Relative: 0 %
HCT: 40.4 % (ref 36.0–46.0)
Hemoglobin: 13 g/dL (ref 12.0–15.0)
Immature Granulocytes: 0 %
Lymphocytes Relative: 22 %
Lymphs Abs: 1.7 K/uL (ref 0.7–4.0)
MCH: 27.6 pg (ref 26.0–34.0)
MCHC: 32.2 g/dL (ref 30.0–36.0)
MCV: 85.8 fL (ref 80.0–100.0)
Monocytes Absolute: 0.4 K/uL (ref 0.1–1.0)
Monocytes Relative: 5 %
Neutro Abs: 5.6 K/uL (ref 1.7–7.7)
Neutrophils Relative %: 73 %
Platelets: 185 K/uL (ref 150–400)
RBC: 4.71 MIL/uL (ref 3.87–5.11)
RDW: 14.6 % (ref 11.5–15.5)
WBC: 7.8 K/uL (ref 4.0–10.5)
nRBC: 0 % (ref 0.0–0.2)

## 2024-04-18 LAB — BASIC METABOLIC PANEL WITH GFR
Anion gap: 9 (ref 5–15)
BUN: 9 mg/dL (ref 6–20)
CO2: 23 mmol/L (ref 22–32)
Calcium: 9.2 mg/dL (ref 8.9–10.3)
Chloride: 105 mmol/L (ref 98–111)
Creatinine, Ser: 0.76 mg/dL (ref 0.44–1.00)
GFR, Estimated: >60 mL/min
Glucose, Bld: 87 mg/dL (ref 70–99)
Potassium: 3.5 mmol/L (ref 3.5–5.1)
Sodium: 138 mmol/L (ref 135–145)

## 2024-04-18 LAB — ETHANOL: Alcohol, Ethyl (B): <15 mg/dL

## 2024-04-18 LAB — HCG, SERUM, QUALITATIVE: Preg, Serum: NEGATIVE

## 2024-04-18 MED ORDER — MAGIC MOUTHWASH
5.0000 mL | Freq: Once | ORAL | Status: AC
  Administered 2024-04-18: 5 mL via ORAL
  Filled 2024-04-18: qty 5

## 2024-04-18 MED ORDER — FENTANYL CITRATE (PF) 50 MCG/ML IJ SOSY
25.0000 ug | PREFILLED_SYRINGE | Freq: Once | INTRAMUSCULAR | Status: AC
  Administered 2024-04-18: 25 ug via INTRAVENOUS
  Filled 2024-04-18: qty 1

## 2024-04-18 MED ORDER — OXYCODONE-ACETAMINOPHEN 5-325 MG PO TABS
1.0000 | ORAL_TABLET | Freq: Once | ORAL | Status: AC
  Administered 2024-04-18: 1 via ORAL
  Filled 2024-04-18: qty 1

## 2024-04-18 MED ORDER — LEVETIRACETAM (KEPPRA) 500 MG/5 ML ADULT IV PUSH
1000.0000 mg | Freq: Once | INTRAVENOUS | Status: AC
  Administered 2024-04-18: 1000 mg via INTRAVENOUS
  Filled 2024-04-18: qty 10

## 2024-04-18 MED ORDER — VALTOCO 20 MG DOSE 2 X 10 MG/0.1ML NA LQPK: 20.0000 mg | Freq: Once | NASAL | 2 refills | Status: AC | PRN

## 2024-04-18 NOTE — ED Notes (Signed)
 Pt agitated at this time, requesting pain medication. This RN explained to patient that no orders have been placed by EDP at this time yet. EDP notified of patient request for pain medication. Patient voices frustration with being placed in hallway and asked to be moved to different spot. It was explained to patient that we have no other open treatment areas at this time. Lighting dimmed in hall for patient comfort.

## 2024-04-18 NOTE — ED Notes (Signed)
 Unsuccessful IV attempt x2.

## 2024-04-18 NOTE — ED Notes (Signed)
 Unsuccessful IV start attempt. Second RN to attempt.

## 2024-04-18 NOTE — ED Notes (Signed)
 Ortho tech called regarding splint order

## 2024-04-18 NOTE — ED Triage Notes (Signed)
 Pt bib [facility] coming from home with CC of seizure. Pt was at work in her office chair when she had a witnessed seizure and was helped to ground. EMS reports staff at work site gave pt diazepam . Pt alert and oriented x4 on ED arrival and ambulatory.  EMS VSS.

## 2024-04-18 NOTE — ED Notes (Signed)
 Ortho at bedside.

## 2024-04-18 NOTE — Progress Notes (Signed)
 Orthopedic Tech Progress Note Patient Details:  Maxcine Strong Jan 04, 1986 [id number] Applied sugar tong splint per order.  Ortho Devices Type of Ortho Device: Ace wrap, Cotton web roll, Sugartong splint Ortho Device/Splint Location: LUE Ortho Device/Splint Interventions: Ordered, Application, Adjustment   Post Interventions Patient Tolerated: Well Instructions Provided: Adjustment of device, Care of device, Poper ambulation with device  Morna Pink 04/18/2024, 11:05 PM

## 2024-04-18 NOTE — ED Provider Notes (Signed)
 " [facility] Provider Note   CSN: [id number] Arrival date & time: 04/18/24  1526     Patient presents with: Seizures   Heather Cherry is a 39 y.o. female.   HPI Pt bib [facility] coming from home with CC of seizure. Pt was at work in her office chair when she had a witnessed seizure and was helped to ground. EMS reports staff at work site gave pt diazepam . Pt alert and oriented x4 on ED arrival and ambulatory.  EMS VSS.  Patient notes that since evaluation at her orthopedic surgeons office yesterday she was doing generally well until today's seizure activity.  Currently she complains primarily of pain in the hand which she believes that she hit when she had her seizure.  Patient subsequently joined by her boyfriend, who assists with the history.    Prior to Admission medications  Medication Sig Start Date End Date Taking? Authorizing Provider  acetaminophen  (TYLENOL ) 500 MG tablet Take 1,000 mg by mouth every 6 (six) hours as needed for moderate pain or headache.     [provider]  acetaZOLAMIDE  (DIAMOX ) 250 MG tablet Take 500 mg by mouth daily.    [provider]  calcium  carbonate (TUMS - DOSED IN MG ELEMENTAL CALCIUM ) 500 MG chewable tablet Chew 2 tablets (400 mg of elemental calcium  total) by mouth 3 (three) times daily with meals. 09/10/23   Maczis, Michael M, PA-C  diazePAM , 20 MG Dose, (VALTOCO  20 MG DOSE) 2 x 10 MG/0.1ML LQPK Place 20 mg into the nose once as needed. 04/18/24   Garrick Charleston, MD  docusate sodium  (COLACE) 100 MG capsule Take 1 capsule (100 mg total) by mouth 2 (two) times daily as needed for mild constipation. 09/10/23   Maczis, Michael M, PA-C  DULoxetine  (CYMBALTA ) 30 MG capsule Take 90 mg by mouth daily.    [provider]  enoxaparin  (LOVENOX ) 80 MG/0.8ML injection Inject 160 mg into the skin every 12 (twelve) hours. 05/20/23   [provider]  gabapentin  (NEURONTIN ) 300 MG capsule Take  3 capsules (900 mg total) by mouth 3 (three) times daily as needed. 09/10/23   Maczis, Michael M, PA-C  HYDROcodone -acetaminophen  (NORCO/VICODIN) 5-325 MG tablet Take 1 tablet by mouth every 6 (six) hours as needed. 09/18/23   Robinson, John K, PA-C  hydrOXYzine  (ATARAX ) 25 MG tablet Take 1 tablet (25 mg total) by mouth 3 (three) times daily as needed for anxiety. 09/10/23   Maczis, Michael M, PA-C  levETIRAcetam  (KEPPRA ) 750 MG tablet Take 1 tablet (750 mg total) by mouth 2 (two) times daily. 09/16/23   Patsy Lenis, MD  lurasidone  (LATUDA ) 20 MG TABS tablet Take 20 mg by mouth at bedtime. 07/29/23   [provider]  methimazole  (TAPAZOLE ) 10 MG tablet Take 30 mg by mouth daily. 07/15/23   [provider]  methocarbamol  (ROBAXIN ) 500 MG tablet Take 2 tablets (1,000 mg total) by mouth every 6 (six) hours as needed for muscle spasms. 09/10/23   Maczis, Michael M, PA-C  metoprolol  succinate (TOPROL -XL) 100 MG 24 hr tablet Take 100 mg by mouth daily. 07/29/23   [provider]  Multiple Vitamins-Iron  (MULTIVITAMINS WITH IRON ) TABS tablet Take 1 tablet by mouth daily. 09/11/23   Maczis, Michael M, PA-C  nystatin  cream (MYCOSTATIN ) Apply 1 Application topically 2 (two) times daily as needed for dry skin. 08/03/23   [provider]  omeprazole (PRILOSEC) 40 MG capsule Take 40 mg by mouth daily. 08/26/23  [provider]  ondansetron  (ZOFRAN -ODT) 8 MG disintegrating tablet Take 8 mg by mouth every 8 (eight) hours as needed for nausea or vomiting. 08/11/23   [provider]  Oxycodone  HCl 10 MG TABS Take 1-1.5 tablets (10-15 mg total) by mouth every 4 (four) hours as needed for breakthrough pain. 09/10/23   Maczis, Michael M, PA-C  polyethylene glycol (MIRALAX  / GLYCOLAX ) 17 g packet Take 17 g by mouth 2 (two) times daily as needed. 09/10/23   Maczis, Michael M, PA-C  prazosin  (MINIPRESS ) 1 MG capsule Take 1 capsule (1 mg total) by mouth at bedtime. 09/10/23   Maczis,  Michael M, PA-C  [Paused] SPRAVATO, 84 MG DOSE, 28 MG/DEVICE SOPK Place 84 mg into both nostrils once a week. On Fridays Wait to take this until your doctor or other care provider tells you to start again. 08/20/23   [provider]  valACYclovir  (VALTREX ) 500 MG tablet Take 500 mg by mouth daily. 05/20/23   [provider]    Allergies: Lisinopril and Lisinopril    Review of Systems  Updated Vital Signs BP (!) 117/56 (BP Location: Right Arm)   Pulse 63   Temp 98.8 F (37.1 C) (Oral)   Resp 18   LMP  (LMP Unknown) Comment: Lupron injection  SpO2 100%   Physical Exam Vitals and nursing note reviewed.  Constitutional:      General: She is not in acute distress.    Appearance: She is well-developed.  HENT:     Head: Normocephalic and atraumatic.  Eyes:     Conjunctiva/sclera: Conjunctivae normal.  Cardiovascular:     Rate and Rhythm: Normal rate and regular rhythm.  Pulmonary:     Effort: Pulmonary effort is normal. No respiratory distress.     Breath sounds: Normal breath sounds. No stridor.  Abdominal:     General: There is no distension.  Musculoskeletal:       Arms:  Skin:    General: Skin is warm and dry.  Neurological:     Mental Status: She is alert and oriented to person, place, and time.     Cranial Nerves: No cranial nerve deficit.  Psychiatric:        Mood and Affect: Mood normal.     (all labs ordered are listed, but only abnormal results are displayed) Labs Reviewed  BASIC METABOLIC PANEL WITH GFR  ETHANOL  CBC WITH DIFFERENTIAL/PLATELET  HCG, SERUM, QUALITATIVE    EKG: None  Radiology: DG Hand Complete Left Result Date: 04/18/2024 EXAM: 3 OR MORE VIEW(S) XRAY OF THE LEFT HAND 04/18/2024 04:56:41 PM COMPARISON: 09/18/2023 CLINICAL HISTORY: fall FINDINGS: BONES AND JOINTS: Again seen are changes of 4th and 5th digit amputations. Volar dislocation of the 3rd metacarpophalangeal (MCP) joint. No acute fracture. SOFT TISSUES: Diffuse soft  tissue swelling of the hand. Numerous retained radiopaque foreign bodies throughout the hand. IMPRESSION: 1. Volar dislocation of the 3rd metacarpophalangeal joint without acute fracture. 2. Diffuse soft tissue swelling with numerous retained radiopaque foreign bodies. 3. 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 Electronically signed by: Greig Pique MD 04/18/2024 05:38 PM EST RP Workstation: [id number]     Procedures   Medications Ordered in the ED  fentaNYL  (SUBLIMAZE ) injection 25 mcg (has no administration in time range)  levETIRAcetam  (KEPPRA ) undiluted injection 1,000 mg (1,000 mg Intravenous Given 04/18/24 1815)  fentaNYL  (SUBLIMAZE ) injection 25 mcg (25 mcg Intravenous Given 04/18/24 1814)  Medical Decision Making Patient with seizure disorder presents after witnessed seizure pain in her hand. Concern for breakthrough seizure, possible infection, dehydration, electrolyte abnormalities, as well as hand trauma. Initial vitals, neuroexam reassuring. Cardiac 80 sinus normal pulse ox 100% room air normal  Amount and/or Complexity of Data Reviewed Independent Historian:     Details: Boyfriend External Data Reviewed: notes. Labs: ordered. Decision-making details documented in ED Course. Radiology: ordered and independent interpretation performed. Decision-making details documented in ED Course.  Risk Prescription drug management. Decision regarding hospitalization. Diagnosis or treatment significantly limited by social determinants of health.  Update patient accompanied by her boyfriend.   8:45 PM Patient in no distress, calm, has received Keppra  loading dose has had no additional seizure activity.  Patient's evaluation generally reassuring.  Line patient's x-ray with suggestion of possible dislocation, third digit.  On reviewing patient's chart including surgical notes from [facility], and discussing the operation note with our local hand surgeon,  seems of during the procedure the MCP, third digit was lax, dislocated.  Given this information, the patient of splinting, rather than an attempted dislocation reduction, will follow-up with orthopedic surgery. Patient will also follow-up with her neuro team to discuss her antiseizure medication regimen. After hours of monitoring without decompensation, with no new complaints patient discharged in stable condition.     Final diagnoses:  Seizure [facility])    ED Discharge Orders          Ordered    diazePAM , 20 MG Dose, (VALTOCO  20 MG DOSE) 2 x 10 MG/0.1ML LQPK  Once PRN        04/18/24 2045               Garrick Charleston, MD 04/18/24 2045  "

## 2024-04-18 NOTE — ED Notes (Signed)
 Second RN at bedside for IV start attempt.

## 2024-04-18 NOTE — ED Notes (Signed)
Pt requesting medication for headache. MD made aware  

## 2024-04-18 NOTE — Discharge Instructions (Signed)
 Today's evaluation has been generally reassuring.  It is important follow-up with your neurologist tomorrow and orthopedist as well.  With your neurologist to discuss potential adjustments to your medication regimen.  With your orthopedist discussed your hand pain following the fall and need for closer follow-up.
